# Patient Record
Sex: Male | Born: 1960
Health system: Southern US, Community
[De-identification: ages and names within clinical notes are randomized; demographics above are authoritative.]

## PROBLEM LIST (undated history)

## (undated) DIAGNOSIS — T8859XA Other complications of anesthesia, initial encounter: Secondary | ICD-10-CM

## (undated) DIAGNOSIS — R519 Headache, unspecified: Secondary | ICD-10-CM

## (undated) DIAGNOSIS — I2510 Atherosclerotic heart disease of native coronary artery without angina pectoris: Secondary | ICD-10-CM

## (undated) DIAGNOSIS — Z9289 Personal history of other medical treatment: Secondary | ICD-10-CM

## (undated) DIAGNOSIS — I519 Heart disease, unspecified: Secondary | ICD-10-CM

## (undated) DIAGNOSIS — I10 Essential (primary) hypertension: Secondary | ICD-10-CM

## (undated) DIAGNOSIS — E119 Type 2 diabetes mellitus without complications: Secondary | ICD-10-CM

## (undated) DIAGNOSIS — M1990 Unspecified osteoarthritis, unspecified site: Secondary | ICD-10-CM

## (undated) DIAGNOSIS — T4145XA Adverse effect of unspecified anesthetic, initial encounter: Secondary | ICD-10-CM

## (undated) HISTORY — DX: Atherosclerotic heart disease of native coronary artery without angina pectoris: I25.10

## (undated) HISTORY — DX: Heart disease, unspecified: I51.9

---

## 2001-03-15 ENCOUNTER — Encounter: Payer: Self-pay | Admitting: Neurosurgery

## 2001-03-15 ENCOUNTER — Encounter: Admission: RE | Admit: 2001-03-15 | Discharge: 2001-03-15 | Payer: Self-pay | Admitting: Neurosurgery

## 2004-06-02 HISTORY — PX: BACK SURGERY: SHX140

## 2007-02-09 ENCOUNTER — Encounter: Admission: RE | Admit: 2007-02-09 | Discharge: 2007-02-09 | Payer: Self-pay | Admitting: Internal Medicine

## 2007-02-19 ENCOUNTER — Encounter: Admission: RE | Admit: 2007-02-19 | Discharge: 2007-02-19 | Payer: Self-pay | Admitting: Internal Medicine

## 2011-04-22 ENCOUNTER — Ambulatory Visit: Payer: Self-pay | Admitting: Sports Medicine

## 2011-04-22 ENCOUNTER — Encounter: Payer: Self-pay | Admitting: Sports Medicine

## 2011-04-22 ENCOUNTER — Ambulatory Visit (INDEPENDENT_AMBULATORY_CARE_PROVIDER_SITE_OTHER): Payer: BC Managed Care – PPO | Admitting: Sports Medicine

## 2011-04-22 VITALS — BP 133/87 | HR 69 | Ht 72.5 in | Wt 220.0 lb

## 2011-04-22 DIAGNOSIS — M25569 Pain in unspecified knee: Secondary | ICD-10-CM

## 2011-04-22 DIAGNOSIS — M25562 Pain in left knee: Secondary | ICD-10-CM | POA: Insufficient documentation

## 2011-04-22 NOTE — Progress Notes (Signed)
  Subjective:    Patient ID: Luis Castillo, male    DOB: 1960-07-01, 50 y.o.   MRN: [id number]  HPI Left knee pain: Pain present for approximately 4 months, localized to the posterior medial joint line, noted after running in the beach notes is worse with any type of twisting motion. No snapping, popping, locking, catching. Is able to do the elliptical without a problem. Minimal swelling.  Past medical history: Hypertension Past surgical history: L5-S1 discectomy. Social history: No alcohol, tobacco, or drugs. As a driving experience company, trained as a Clinical research associate. Family history: Non-contributory Allergies: No known drug allergies.  Review of Systems    No fevers, chills, night sweats, weight loss.  Objective:   Physical Exam General:  Well developed, well nourished, and in no acute distress. Neuro:  Alert and oriented x3, extra-ocular muscles intact. Skin: Warm and dry. Respiratory:  Not using accessory muscles, speaking in full sentences. Knee: Normal to inspection with no erythema or effusion or obvious bony abnormalities. Palpation normal with no warmth, patellar tenderness, or condyle tenderness. There is significant tenderness over the posterior medial joint line. ROM full in flexion and extension and lower leg rotation. Ligaments with solid consistent endpoints including ACL, PCL, LCL, MCL. Positive McMurray's, and Apley tests. Non painful patellar compression. Patellar glide without crepitus. Patellar and quadriceps tendons unremarkable. Hamstring and quadriceps strength is normal.   MSK US performed of: Left knee  Knee: All structures visualized. Posterior medial meniscus shows a longitudinal split. Patellar Tendon unremarkable on long and transverse views without effusion. No abnormality of prepatellar bursa. LCL and MCL unremarkable on long and transverse views. No abnormality of origin of medial or lateral head of the gastrocnemius.  Consent obtained and  verified. Time-out conducted. Noted no overlying erythema, induration, or other signs of local infection. Sterile betadine prep. Furthur cleansed with alcohol. Topical analgesic spray: Ethyl chloride. Joint: Knee. Approached in typical fashion with: 25-gauge needle. Completed without difficulty Meds: 1 cc Depo-Medrol 40, 4 cc lidocaine 1% no epi. Advised to call if fevers/chills, erythema, induration, drainage, or persistent bleeding.    Assessment & Plan:   Left knee pain: This most likely represents a degenerative meniscal tear. Injection as above, meniscal rehabilitation exercises, body helix knee sleeve, Aleve as needed for pain, avoid deep knee bending, come back to see [place] in 2-3 weeks.

## 2011-04-22 NOTE — Patient Instructions (Signed)
Great to see you, Injected your knee, Avoid deep bending, Due the rehabilitation exercises, Use Aleve twice a day for pain. Come back to see me in about 3 weeks.  Luis Castillo. Benjamin Stain, M.D. [facility] [address], [place] [address] [phone]

## 2011-05-20 ENCOUNTER — Ambulatory Visit (INDEPENDENT_AMBULATORY_CARE_PROVIDER_SITE_OTHER): Payer: BC Managed Care – PPO | Admitting: Sports Medicine

## 2011-05-20 VITALS — BP 130/82

## 2011-05-20 DIAGNOSIS — M25562 Pain in left knee: Secondary | ICD-10-CM

## 2011-05-20 DIAGNOSIS — M25569 Pain in unspecified knee: Secondary | ICD-10-CM

## 2011-05-20 NOTE — Assessment & Plan Note (Addendum)
Most likely degenerative meniscal tear. Injected again per pt's request. Cleared to go skiing. Cont to wear knee sleeve. Pt will call back ~ 3 weeks, if no better MRI knee.

## 2011-05-20 NOTE — Progress Notes (Signed)
  Subjective:    Patient ID: Luis Castillo, male    DOB: 04-Jan-1961, 50 y.o.   MRN: [id number]  HPI This young man comes back in for followup of his left knee pain. He has a presumptive diagnosis of a meniscal tear. I injected his knee at the last visit, he comes back in with zero mechanical symptoms, and approximately 50% better. He has not really been using any anti-inflammatories. He does note that the rehabilitation exercises continue to help. He does have a ski trip coming up and is requesting another knee injection.  Review of Systems    no fevers, chills, night sweats, weight loss. Objective:   Physical Exam General:  Well developed, well nourished, and in no acute distress. Neuro:  Alert and oriented x3, extra-ocular muscles intact. Skin: Warm and dry. Respiratory:  Not using accessory muscles, speaking in full sentences. Knee: Normal to inspection with no erythema or effusion or obvious bony abnormalities. Palpation normal with no warmth, patellar tenderness, or condyle tenderness. There is significant tenderness over the posterior medial joint line. ROM full in flexion and extension and lower leg rotation. Ligaments with solid consistent endpoints including ACL, PCL, LCL, MCL. Positive McMurray's, and Apley tests. Non painful patellar compression. Patellar glide without crepitus. Patellar and quadriceps tendons unremarkable. Hamstring and quadriceps strength is normal.   Consent obtained and verified. Time-out conducted. Noted no overlying erythema, induration, or other signs of local infection. Sterile betadine prep. Furthur cleansed with alcohol. Topical analgesic spray: Ethyl chloride. Joint: Knee. Approached in typical fashion with: 25-gauge needle. Completed without difficulty Meds: 1 cc Depo-Medrol 40, 4 cc lidocaine 1% no epi. Advised to call if fevers/chills, erythema, induration, drainage, or persistent bleeding.     Assessment & Plan:

## 2011-05-20 NOTE — Patient Instructions (Signed)
Great to see you, Injected knee. Call me in 3 weeks or so re: symptoms. If no better will will MRI knee.  Have fun skiing!   Ihor Austin. Benjamin Stain, M.D. [facility] [address] [place] [address] [phone]

## 2011-06-17 ENCOUNTER — Other Ambulatory Visit: Payer: Self-pay | Admitting: *Deleted

## 2011-06-17 ENCOUNTER — Encounter: Payer: Self-pay | Admitting: *Deleted

## 2011-06-17 DIAGNOSIS — M25562 Pain in left knee: Secondary | ICD-10-CM

## 2011-06-17 NOTE — Patient Instructions (Signed)
MRI APPT IS MON 06/30/11 @ 8:45AM [facility] [address] [phone]

## 2011-06-30 ENCOUNTER — Ambulatory Visit
Admission: RE | Admit: 2011-06-30 | Discharge: 2011-06-30 | Disposition: A | Discharge status: Home / Self Care | Payer: BC Managed Care – PPO | Source: Ambulatory Visit | Attending: Sports Medicine | Admitting: Sports Medicine

## 2011-06-30 DIAGNOSIS — M25562 Pain in left knee: Secondary | ICD-10-CM

## 2011-07-04 ENCOUNTER — Other Ambulatory Visit: Payer: Self-pay | Admitting: Sports Medicine

## 2011-07-04 DIAGNOSIS — M25562 Pain in left knee: Secondary | ICD-10-CM

## 2011-07-07 ENCOUNTER — Encounter: Payer: Self-pay | Admitting: *Deleted

## 2011-07-07 NOTE — Patient Instructions (Signed)
ORTHO APPT IS WITH DR SUPPLE Friday FEB 8TH AT 2:45PM [address], [place], [place] [address] [phone] PT'S WIFE INFORMED.

## 2012-11-29 DIAGNOSIS — H4420 Degenerative myopia, unspecified eye: Secondary | ICD-10-CM | POA: Insufficient documentation

## 2013-03-07 ENCOUNTER — Ambulatory Visit
Admission: RE | Admit: 2013-03-07 | Discharge: 2013-03-07 | Disposition: A | Discharge status: Home / Self Care | Payer: BC Managed Care – PPO | Source: Ambulatory Visit | Attending: Sports Medicine | Admitting: Sports Medicine

## 2013-03-07 ENCOUNTER — Ambulatory Visit (INDEPENDENT_AMBULATORY_CARE_PROVIDER_SITE_OTHER): Payer: BC Managed Care – PPO | Admitting: Sports Medicine

## 2013-03-07 ENCOUNTER — Encounter: Payer: Self-pay | Admitting: Sports Medicine

## 2013-03-07 VITALS — BP 129/89 | Ht 72.5 in | Wt 217.0 lb

## 2013-03-07 DIAGNOSIS — M25561 Pain in right knee: Secondary | ICD-10-CM

## 2013-03-07 DIAGNOSIS — M25569 Pain in unspecified knee: Secondary | ICD-10-CM

## 2013-03-07 NOTE — Progress Notes (Signed)
Luis Castillo is a 52 y.o. male who presents today for R knee pain.  Pt recently had L meniscectomy in February 2014 by Dr. Rennis Chris, and shortly thereafter around March, developed R medial knee joint pain, acute in onset after running.  Denies any previous problems with his right knee..  Pt initially had an achy soreness after running in his right knee that would dissipate over the following day. However, pt has noticed the pain has intensified over the last 3-4 months, now having soreness for several days after, along with pain with squatting or twisting.  He denies any locking, catching, instability, joint effusion, erythema, or any other joints affected.  Does state this pain feels very similar to the left side.     Current Outpatient Prescriptions on File Prior to Visit  Medication Sig Dispense Refill  . amLODipine-benazepril (LOTREL) 5-20 MG per capsule Take 5-20 mg by mouth daily.      . hydrochlorothiazide (HYDRODIURIL) 25 MG tablet Take 25 mg by mouth daily.      Marland Kitchen VIAGRA 100 MG tablet Take 100 mg by mouth as needed.       No current facility-administered medications on file prior to visit.    ROS: Per HPI.  All other systems reviewed and are negative.   Physical Exam Filed Vitals:   03/07/13 1000  BP: 129/89    Physical Examination:  52 y/o M in NAD R Knee: Trace effusion Palpation with posterior medial joint line tenderness.  No warmth or patellar tenderness or condyle tenderness. ROM with full extension, 110-120 degrees flexion  Ligaments with solid consistent endpoints including ACL, PCL, LCL, MCL. Positive Apley's and Thessaly test.  Positive medial McMurrays  Non painful patellar compression. Patellar and quadriceps tendons unremarkable. Hamstring and quadriceps strength is normal.    MSK [place] R knee, Long and Short Axis obtained - posterior medial calcaneous with hyperechoic area in the meniscus present representing possible calcification.  Slight protrusion with  hypoechoic area possible representing edema.

## 2013-03-07 NOTE — Patient Instructions (Addendum)
You have been scheduled for a MRI of your right knee on 03/23/13 at 9 am at [facility] [address] [place]  [phone]

## 2013-03-07 NOTE — Assessment & Plan Note (Addendum)
Will obtain X-rays to evaluate for possible bony or acute changes in the knee.  As well, will get MRI to evaluate for possible medial meniscal tear.  Pt saw Dr. Rennis Chris, [facility], in the past for a L menisectomy, and if MRI does show this, will refer back for further evaluation and management.

## 2013-03-08 ENCOUNTER — Telehealth: Payer: Self-pay | Admitting: *Deleted

## 2013-03-08 NOTE — Telephone Encounter (Signed)
Message copied by Jacki Cones C on Tue Mar 08, 2013 11:00 AM ------      Message from: Ralene Cork      Created: Tue Mar 08, 2013 10:12 AM      Regarding: xrays       Please call and tell him that his x-rays look good. I will call him after reviewing his MRI.            ----- Message -----         From: Rad Results In Interface         Sent: 03/07/2013   1:23 PM           To: Ralene Cork, DO                   ------

## 2013-03-08 NOTE — Telephone Encounter (Signed)
Left pt a VM to call back

## 2013-03-09 NOTE — Telephone Encounter (Signed)
Spoke with pt- gave him x-ray results.

## 2013-03-21 ENCOUNTER — Ambulatory Visit
Admission: RE | Admit: 2013-03-21 | Discharge: 2013-03-21 | Disposition: A | Discharge status: Home / Self Care | Payer: BC Managed Care – PPO | Source: Ambulatory Visit | Attending: Sports Medicine | Admitting: Sports Medicine

## 2013-03-21 DIAGNOSIS — M25561 Pain in right knee: Secondary | ICD-10-CM

## 2013-03-22 ENCOUNTER — Telehealth: Payer: Self-pay | Admitting: Sports Medicine

## 2013-03-22 NOTE — Telephone Encounter (Signed)
I spoke with the patient on the phone regarding MRI findings of his right knee. He has a radial tear through the body of the medial meniscus. The rest of his knee looks really good. No significant degenerative changes. His left knee was previously scoped by Dr. Rennis Chris with good results. He would like to return to Dr. supple for arthroscopy on his right knee but his travel plans right now will not allow him to pursue surgery. Therefore, I've asked the patient to contact [facility] orthopedics directly to see if he needs a referral. So, he will call me back at his convenience at which point I will be happy to make the referral for him. Followup with me prn.

## 2013-03-23 ENCOUNTER — Other Ambulatory Visit: Payer: BC Managed Care – PPO

## 2013-06-02 HISTORY — PX: KNEE CARTILAGE SURGERY: SHX688

## 2014-03-13 IMAGING — CR DG KNEE AP/LAT W/ SUNRISE*R*
3 series · 3 of 3 positions shown · non-contrast
Comparison: None

CLINICAL DATA: Pain in medial knee and tibial tubercle especially
after running or impact exercise

EXAM:
DG KNEE - 3 VIEWS

[view not recorded (1 of 3)]
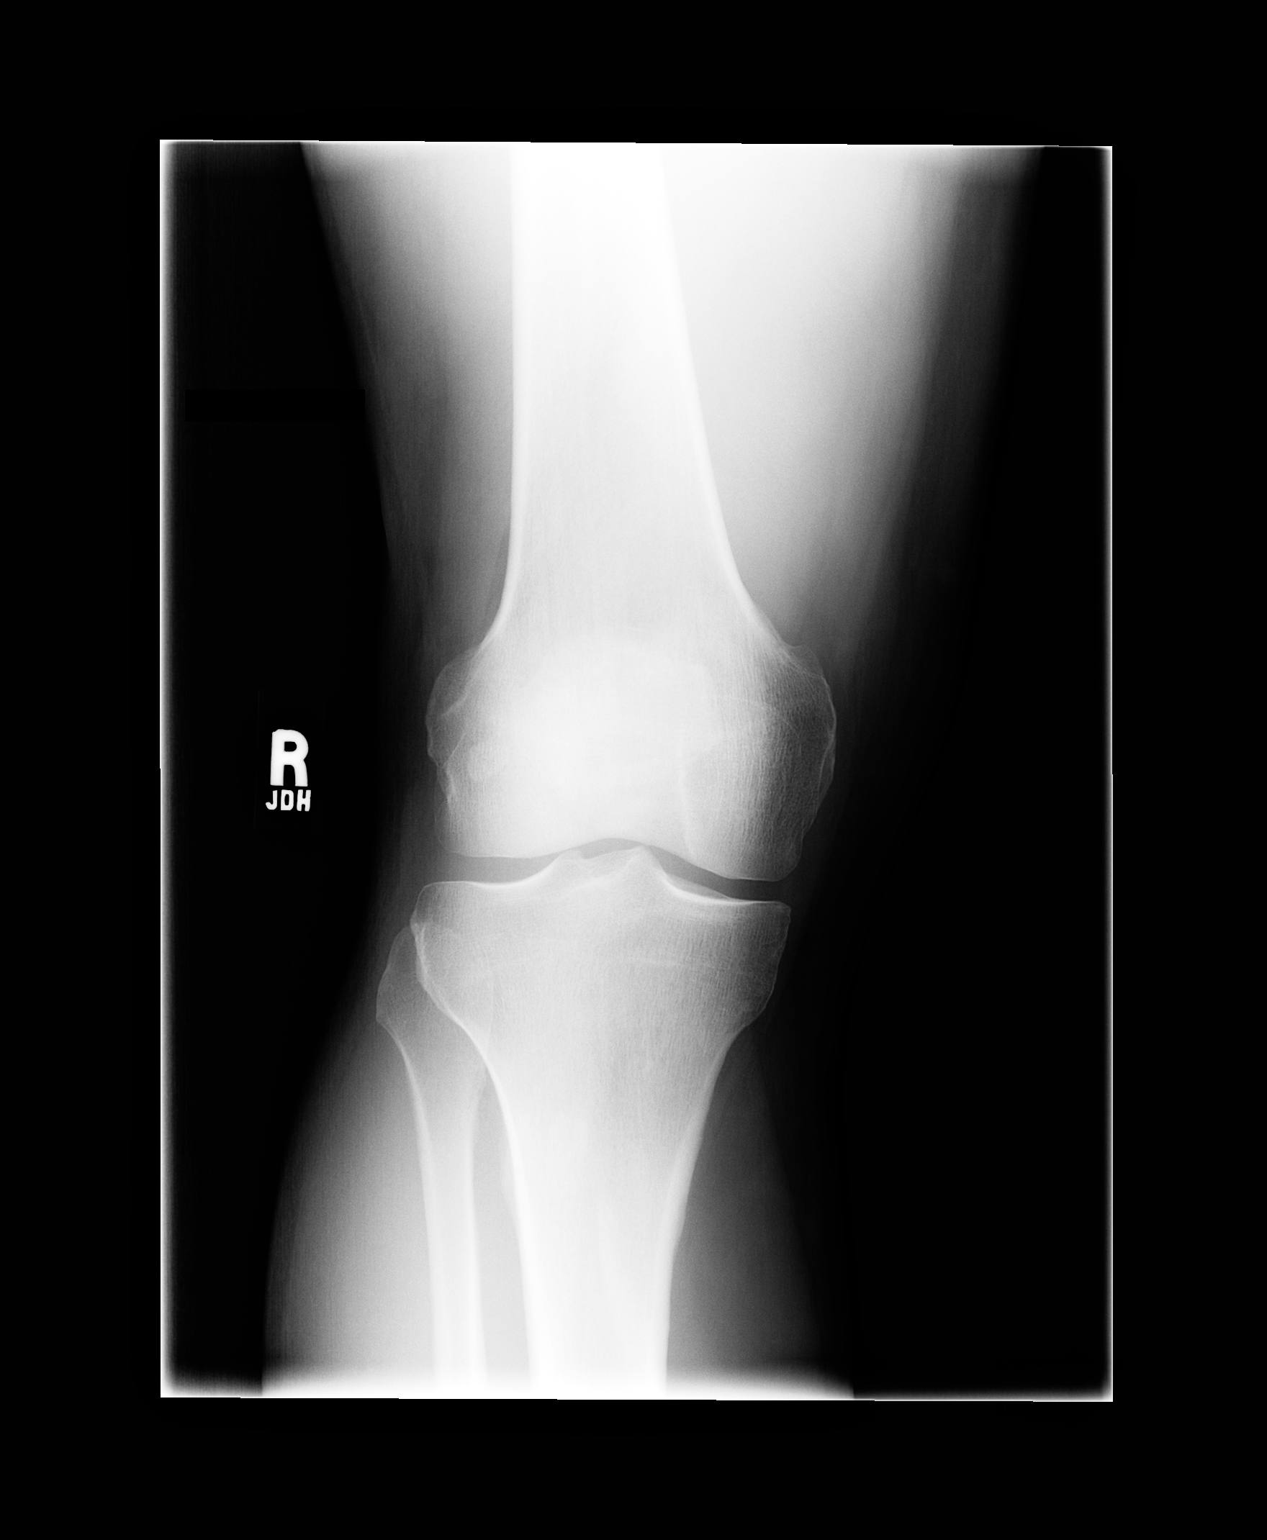

[view not recorded (2 of 3)]
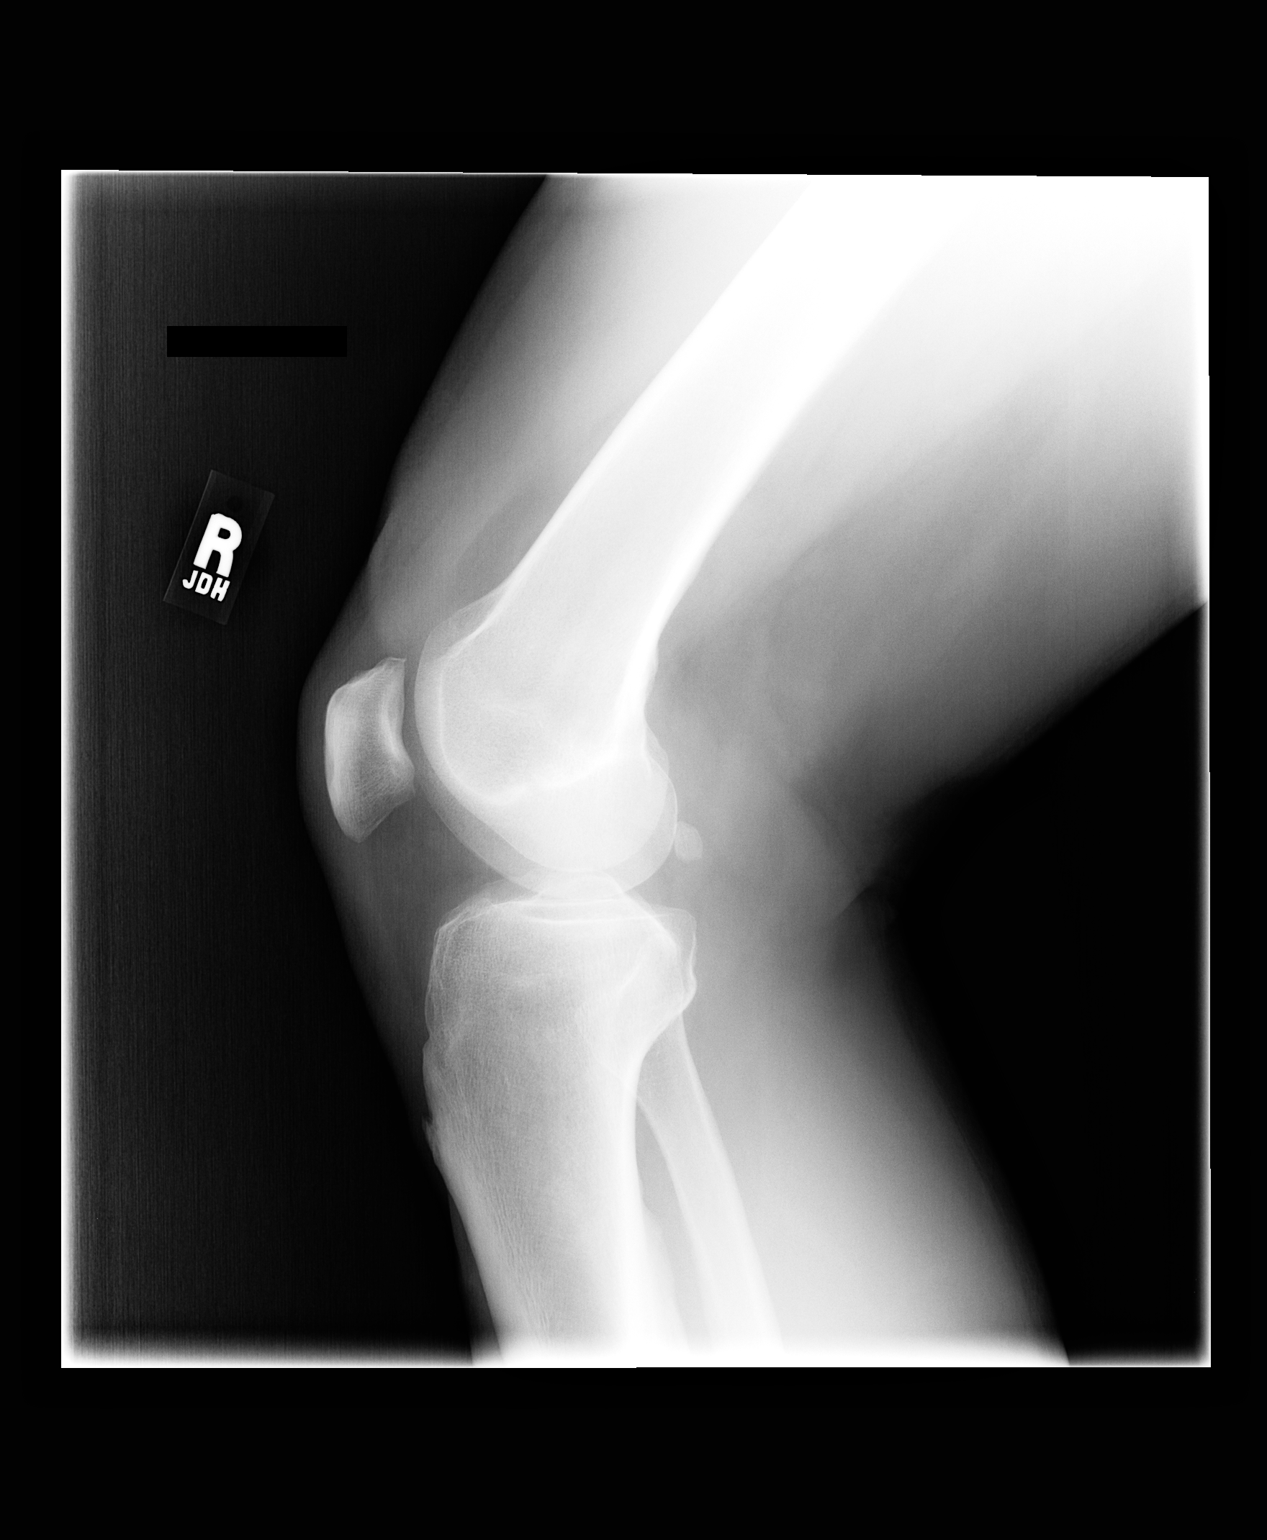

[view not recorded (3 of 3)]
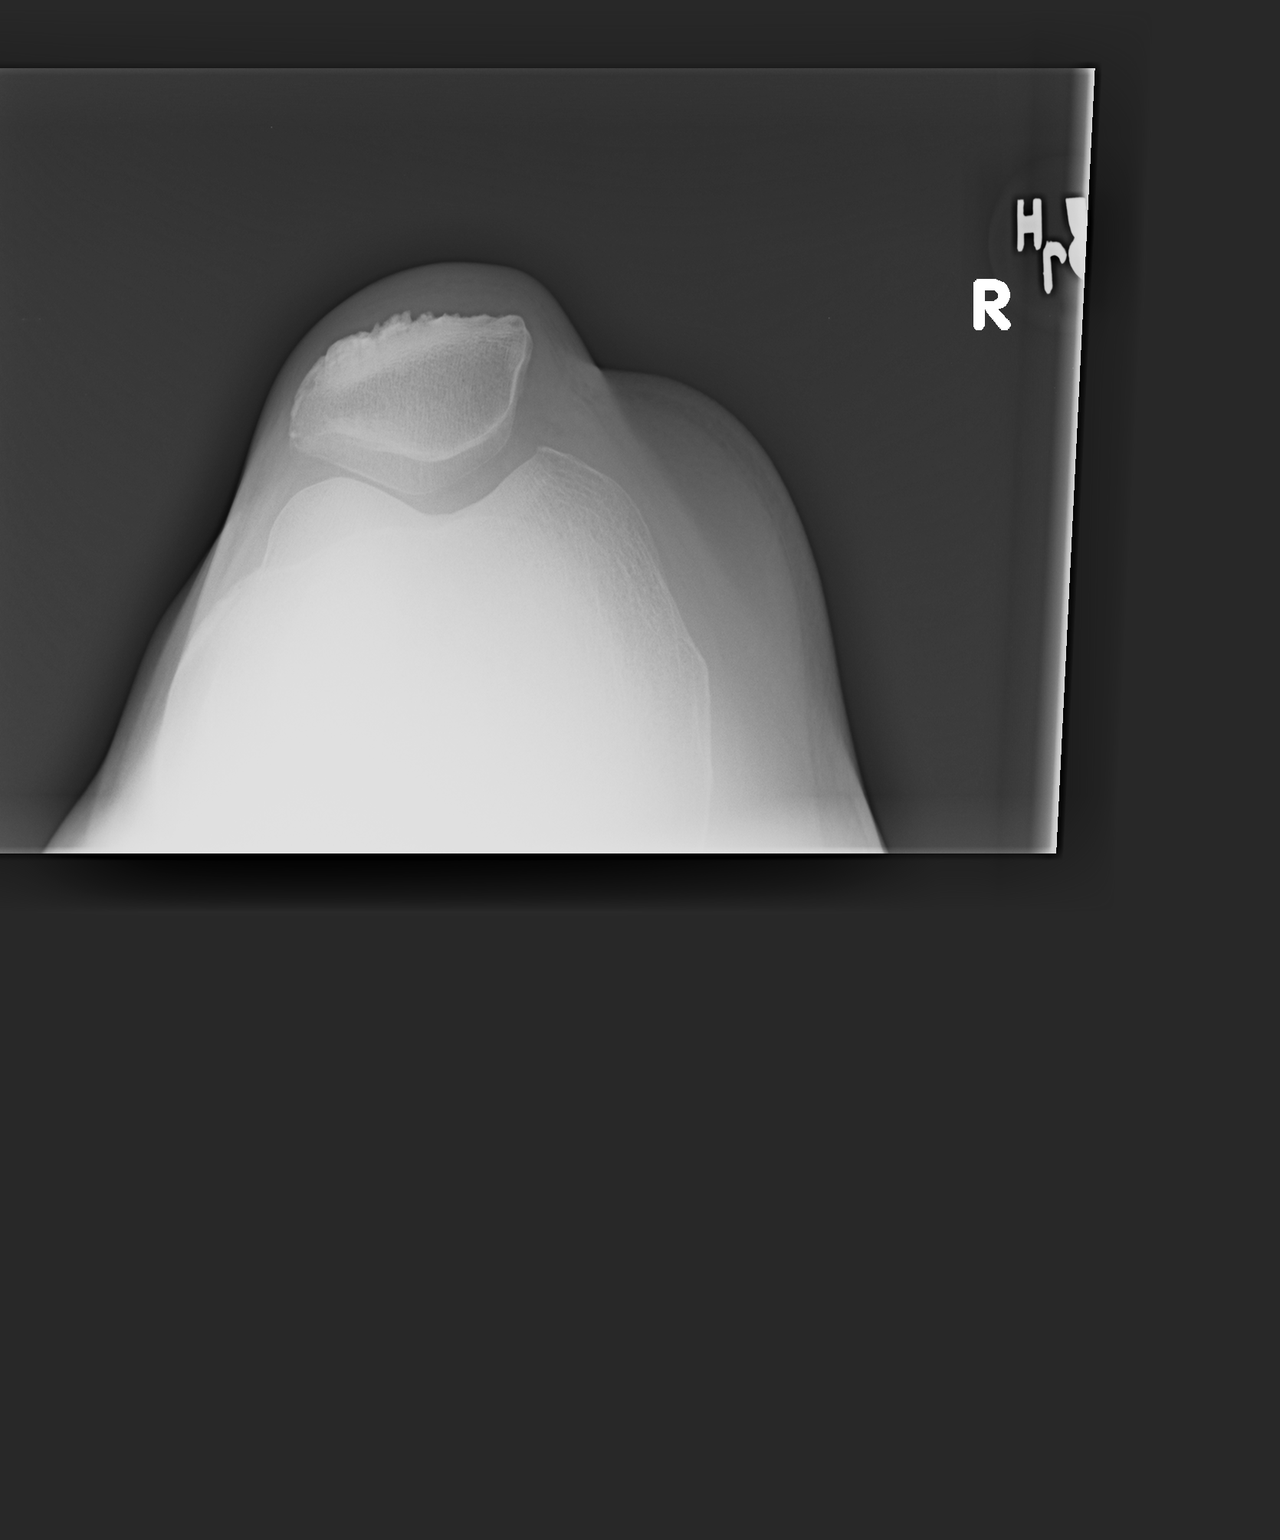

[3 of 3 positions shown; findings below may reference images not displayed]

FINDINGS: Osseous mineralization normal.

Joint spaces preserved.

No acute fracture, dislocation or bone destruction.

No knee joint effusion.
IMPRESSION: No acute osseous abnormalities.

## 2016-09-18 ENCOUNTER — Other Ambulatory Visit: Payer: Self-pay | Admitting: Gastroenterology

## 2016-11-09 NOTE — Anesthesia Preprocedure Evaluation (Addendum)
Anesthesia Evaluation  Patient identified by MRN, date of birth, ID band Patient awake    Reviewed: Allergy & Precautions, NPO status , Patient's Chart, lab work & pertinent test results  History of Anesthesia Complications Negative for: history of anesthetic complications  Airway Mallampati: II  TM Distance: >3 FB Neck ROM: Full    Dental no notable dental hx. (+) Dental Advisory Given   Pulmonary neg pulmonary ROS,    Pulmonary exam normal        Cardiovascular hypertension, Pt. on medications Normal cardiovascular exam     Neuro/Psych negative neurological ROS  negative psych ROS   GI/Hepatic negative GI ROS, Neg liver ROS,   Endo/Other  diabetes  Renal/GU negative Renal ROS     Musculoskeletal negative musculoskeletal ROS (+)   Abdominal   Peds  Hematology negative hematology ROS (+)   Anesthesia Other Findings Day of surgery medications reviewed with the patient.  Reproductive/Obstetrics                            Anesthesia Physical Anesthesia Plan  ASA: II  Anesthesia Plan: MAC   Post-op Pain Management:    Induction: Intravenous  PONV Risk Score and Plan: 1 and Ondansetron and Propofol  Airway Management Planned: Simple Face Mask and Natural Airway  Additional Equipment:   Intra-op Plan:   Post-operative Plan:   Informed Consent: I have reviewed the patients History and Physical, chart, labs and discussed the procedure including the risks, benefits and alternatives for the proposed anesthesia with the patient or authorized representative who has indicated his/her understanding and acceptance.   Dental advisory given  Plan Discussed with: CRNA and Anesthesiologist  Anesthesia Plan Comments:        Anesthesia Quick Evaluation

## 2016-11-10 ENCOUNTER — Encounter (HOSPITAL_COMMUNITY)
Admission: RE | Disposition: A | Discharge status: Home / Self Care | Payer: Self-pay | Source: Ambulatory Visit | Attending: Gastroenterology

## 2016-11-10 ENCOUNTER — Ambulatory Visit (HOSPITAL_COMMUNITY)
Admission: RE | Admit: 2016-11-10 | Discharge: 2016-11-10 | Disposition: A | Discharge status: Home / Self Care | Payer: BLUE CROSS/BLUE SHIELD | Source: Ambulatory Visit | Attending: Gastroenterology | Admitting: Gastroenterology

## 2016-11-10 ENCOUNTER — Encounter (HOSPITAL_COMMUNITY): Payer: Self-pay

## 2016-11-10 ENCOUNTER — Ambulatory Visit (HOSPITAL_COMMUNITY): Payer: BLUE CROSS/BLUE SHIELD | Admitting: Anesthesiology

## 2016-11-10 DIAGNOSIS — Z1211 Encounter for screening for malignant neoplasm of colon: Secondary | ICD-10-CM | POA: Insufficient documentation

## 2016-11-10 DIAGNOSIS — Z86010 Personal history of colonic polyps: Secondary | ICD-10-CM | POA: Insufficient documentation

## 2016-11-10 DIAGNOSIS — E7800 Pure hypercholesterolemia, unspecified: Secondary | ICD-10-CM | POA: Insufficient documentation

## 2016-11-10 DIAGNOSIS — Z7984 Long term (current) use of oral hypoglycemic drugs: Secondary | ICD-10-CM | POA: Insufficient documentation

## 2016-11-10 DIAGNOSIS — Z7982 Long term (current) use of aspirin: Secondary | ICD-10-CM | POA: Diagnosis not present

## 2016-11-10 DIAGNOSIS — E119 Type 2 diabetes mellitus without complications: Secondary | ICD-10-CM | POA: Diagnosis not present

## 2016-11-10 DIAGNOSIS — I10 Essential (primary) hypertension: Secondary | ICD-10-CM | POA: Insufficient documentation

## 2016-11-10 DIAGNOSIS — Z79899 Other long term (current) drug therapy: Secondary | ICD-10-CM | POA: Insufficient documentation

## 2016-11-10 HISTORY — DX: Adverse effect of unspecified anesthetic, initial encounter: T41.45XA

## 2016-11-10 HISTORY — DX: Essential (primary) hypertension: I10

## 2016-11-10 HISTORY — DX: Other complications of anesthesia, initial encounter: T88.59XA

## 2016-11-10 HISTORY — PX: COLONOSCOPY WITH PROPOFOL: SHX5780

## 2016-11-10 HISTORY — DX: Type 2 diabetes mellitus without complications: E11.9

## 2016-11-10 LAB — GLUCOSE, CAPILLARY: Glucose-Capillary: 175 mg/dL — ABNORMAL HIGH (ref 65–99)

## 2016-11-10 SURGERY — COLONOSCOPY WITH PROPOFOL
Anesthesia: Monitor Anesthesia Care

## 2016-11-10 MED ORDER — GLYCOPYRROLATE 0.2 MG/ML IV SOSY
PREFILLED_SYRINGE | INTRAVENOUS | Status: AC
  Filled 2016-11-10: qty 5

## 2016-11-10 MED ORDER — PROPOFOL 10 MG/ML IV BOLUS
INTRAVENOUS | Status: AC
  Filled 2016-11-10: qty 40

## 2016-11-10 MED ORDER — SODIUM CHLORIDE 0.9 % IV SOLN: INTRAVENOUS | Status: DC

## 2016-11-10 MED ORDER — MIDAZOLAM HCL 2 MG/2ML IJ SOLN
INTRAMUSCULAR | Status: AC
  Filled 2016-11-10: qty 2

## 2016-11-10 MED ORDER — PROPOFOL 500 MG/50ML IV EMUL
INTRAVENOUS | Status: DC | PRN
  Administered 2016-11-10: 135 ug/kg/min via INTRAVENOUS

## 2016-11-10 MED ORDER — LACTATED RINGERS IV SOLN
INTRAVENOUS | Status: DC | PRN
  Administered 2016-11-10: 07:00:00 via INTRAVENOUS

## 2016-11-10 MED ORDER — LACTATED RINGERS IV SOLN: INTRAVENOUS | Status: DC

## 2016-11-10 MED ORDER — PROPOFOL 10 MG/ML IV BOLUS
INTRAVENOUS | Status: AC
  Filled 2016-11-10: qty 20

## 2016-11-10 MED ORDER — PROPOFOL 500 MG/50ML IV EMUL
INTRAVENOUS | Status: DC | PRN
  Administered 2016-11-10: 50 mg via INTRAVENOUS

## 2016-11-10 MED ORDER — GLYCOPYRROLATE 0.2 MG/ML IJ SOLN
INTRAMUSCULAR | Status: DC | PRN
  Administered 2016-11-10: 0.2 mg via INTRAVENOUS

## 2016-11-10 MED ORDER — MIDAZOLAM HCL 5 MG/5ML IJ SOLN
INTRAMUSCULAR | Status: DC | PRN
  Administered 2016-11-10: 2 mg via INTRAVENOUS

## 2016-11-10 SURGICAL SUPPLY — 21 items

## 2016-11-10 NOTE — Discharge Instructions (Signed)

## 2016-11-10 NOTE — Anesthesia Postprocedure Evaluation (Signed)
Anesthesia Post Note  Patient: Luis Castillo  Procedure(s) Performed: Procedure(s) (LRB): COLONOSCOPY WITH PROPOFOL (N/A)     Patient location during evaluation: Endoscopy Anesthesia Type: MAC Level of consciousness: awake and alert Pain management: pain level controlled Vital Signs Assessment: post-procedure vital signs reviewed and stable Respiratory status: spontaneous breathing and respiratory function stable Cardiovascular status: stable Anesthetic complications: no    Last Vitals:  Vitals:   11/10/16 0800 11/10/16 0810  BP: 133/79 136/90  Pulse: 66 62  Resp: 16 18  Temp:      Last Pain:  Vitals:   11/10/16 0624  TempSrc: Oral                 Kayte Borchard DANIEL

## 2016-11-10 NOTE — Op Note (Signed)
[facility] Patient Name: Luis Castillo Procedure Date: 11/10/2016 MRN: [id number] Attending MD: Charolett Bumpers , MD Date of Birth: 03-28-1961 CSN: [id number] Age: 56 Admit Type: Outpatient Procedure:                Colonoscopy Indications:              High risk colon cancer surveillance: 07/01/2011                            screening colonoscopy was performed with removal of                            a 3 mm tubular adenomatous descending colon polyp Providers:                Charolett Bumpers, MD, Omelia Blackwater RN, RN,                            Zoila Shutter, Technician Referring MD:              Medicines:                Propofol per Anesthesia Complications:            No immediate complications. Estimated Blood Loss:     Estimated blood loss: none. Procedure:                Pre-Anesthesia Assessment:                           - Prior to the procedure, a History and Physical                            was performed, and patient medications and                            allergies were reviewed. The patient's tolerance of                            previous anesthesia was also reviewed. The risks                            and benefits of the procedure and the sedation                            options and risks were discussed with the patient.                            All questions were answered, and informed consent                            was obtained. Prior Anticoagulants: The patient has                            taken aspirin, last dose was 1 day prior to  procedure. ASA Grade Assessment: II - A patient                            with mild systemic disease. After reviewing the                            risks and benefits, the patient was deemed in                            satisfactory condition to undergo the procedure.                           After obtaining informed consent, the colonoscope      was passed under direct vision. Throughout the                            procedure, the patient's blood pressure, pulse, and                            oxygen saturations were monitored continuously. The                            EC-[id number] ([id number]) scope was introduced through                            the anus and advanced to the the cecum, identified                            by appendiceal orifice and ileocecal valve. The                            colonoscopy was performed without difficulty. The                            patient tolerated the procedure well. The quality                            of the bowel preparation was good. The terminal                            ileum, the ileocecal valve, the appendiceal orifice                            and the rectum were photographed. Scope In: 7:31:20 AM Scope Out: 7:47:09 AM Scope Withdrawal Time: 0 hours 9 minutes 7 seconds  Total Procedure Duration: 0 hours 15 minutes 49 seconds  Findings:      The perianal and digital rectal examinations were normal.      The entire examined colon appeared normal. Impression:               - The entire examined colon is normal.                           - No specimens collected. Moderate Sedation:  N/A- Per Anesthesia Care Recommendation:           - Patient has a contact number available for                            emergencies. The signs and symptoms of potential                            delayed complications were discussed with the                            patient. Return to normal activities tomorrow.                            Written discharge instructions were provided to the                            patient.                           - Repeat colonoscopy in 10 years for screening                            purposes.                           - Resume previous diet.                           - Continue present medications. Procedure Code(s):        --- Professional  ---                           Z6109, Colorectal cancer screening; colonoscopy on                            individual at high risk Diagnosis Code(s):        --- Professional ---                           Z86.010, Personal history of colonic polyps CPT copyright 2016 American Medical Association. All rights reserved. The codes documented in this report are preliminary and upon coder review may  be revised to meet current compliance requirements. Danise Edge, MD Charolett Bumpers, MD 11/10/2016 7:55:43 AM This report has been signed electronically. Number of Addenda: 0

## 2016-11-10 NOTE — H&P (Signed)
Procedure: Surveillance colonoscopy. 07/01/2011 screening colonoscopy was performed with removal of a 3 mm tubular adenomatous descending colon polyp  History: The patient is a 56 year old male born Sep 21, 1960. He is scheduled to undergo a surveillance colonoscopy today.  Past medical history: Bilateral knee meniscus repairs. Hemorrhoid surgery. Microdiscectomy of the lumbar spine. Hypertension. Type 2 diabetes mellitus. Hypercholesterolemia. Hepatic steatosis. Glaucoma.  Exam: The patient is alert and lying comfortably on the endoscopy stretcher. Abdomen is soft and nontender to palpation. Lungs are clear to auscultation. Cardiac exam reveals a regular rhythm.  Plan: Proceed with surveillance colonoscopy

## 2016-11-10 NOTE — Transfer of Care (Signed)
Immediate Anesthesia Transfer of Care Note  Patient: Luis Castillo  Procedure(s) Performed: Procedure(s): COLONOSCOPY WITH PROPOFOL (N/A)  Patient Location: PACU  Anesthesia Type:MAC  Level of Consciousness:  sedated, patient cooperative and responds to stimulation  Airway & Oxygen Therapy:Patient Spontanous Breathing and Patient connected to face mask oxgen  Post-op Assessment:  Report given to PACU RN and Post -op Vital signs reviewed and stable  Post vital signs:  Reviewed and stable  Last Vitals:  Vitals:   11/10/16 0624  BP: (!) 174/89  Pulse: (!) 50  Resp: 13  Temp: 06.2 C    Complications: No apparent anesthesia complications

## 2016-11-11 ENCOUNTER — Encounter (HOSPITAL_COMMUNITY): Payer: Self-pay | Admitting: Gastroenterology

## 2017-09-14 DIAGNOSIS — E1169 Type 2 diabetes mellitus with other specified complication: Secondary | ICD-10-CM | POA: Diagnosis not present

## 2017-09-14 DIAGNOSIS — E7800 Pure hypercholesterolemia, unspecified: Secondary | ICD-10-CM | POA: Diagnosis not present

## 2017-09-14 DIAGNOSIS — Z125 Encounter for screening for malignant neoplasm of prostate: Secondary | ICD-10-CM | POA: Diagnosis not present

## 2017-09-14 DIAGNOSIS — Z1389 Encounter for screening for other disorder: Secondary | ICD-10-CM | POA: Diagnosis not present

## 2017-09-14 DIAGNOSIS — Z0000 Encounter for general adult medical examination without abnormal findings: Secondary | ICD-10-CM | POA: Diagnosis not present

## 2017-10-05 DIAGNOSIS — E119 Type 2 diabetes mellitus without complications: Secondary | ICD-10-CM | POA: Diagnosis not present

## 2017-12-23 DIAGNOSIS — H40013 Open angle with borderline findings, low risk, bilateral: Secondary | ICD-10-CM | POA: Diagnosis not present

## 2017-12-23 DIAGNOSIS — Z7984 Long term (current) use of oral hypoglycemic drugs: Secondary | ICD-10-CM | POA: Diagnosis not present

## 2017-12-23 DIAGNOSIS — E119 Type 2 diabetes mellitus without complications: Secondary | ICD-10-CM | POA: Diagnosis not present

## 2017-12-28 DIAGNOSIS — E119 Type 2 diabetes mellitus without complications: Secondary | ICD-10-CM | POA: Diagnosis not present

## 2018-01-18 ENCOUNTER — Ambulatory Visit: Payer: BLUE CROSS/BLUE SHIELD | Admitting: Sports Medicine

## 2018-01-18 VITALS — BP 134/92 | Ht 72.5 in | Wt 213.0 lb

## 2018-01-18 DIAGNOSIS — M7712 Lateral epicondylitis, left elbow: Secondary | ICD-10-CM | POA: Diagnosis not present

## 2018-01-18 MED ORDER — MELOXICAM 15 MG PO TABS: ORAL_TABLET | ORAL | 1 refills | Status: DC

## 2018-01-19 ENCOUNTER — Encounter: Payer: Self-pay | Admitting: Sports Medicine

## 2018-01-19 NOTE — Progress Notes (Signed)
   Subjective:    Patient ID: Luis Castillo, male    DOB: 04-23-1961, 57 y.o.   MRN: [id number]  HPI chief complaint: Left elbow pain and mid back pain  Very pleasant 57 year old male comes in today complaining of 3 months of lateral left elbow pain. He does not recall any specific injury but began to notice some increasing discomfort when weight lifting. He notes that certain lifts like bicep curls will cause him significant discomfort. Other exercises are less painful. His pain is intermittent. He does get some pain with activities of daily living such as picking up heavy objects with his left hand. He has noticed some swelling along the lateral elbow. He denies any problems with the left elbow in the past but does have a history of lateral epicondylitis on the right. His current pain in the left elbowt feels a little bit different to him. He denies radiating pain into the forearm. He denies numbness or tingling. No specific treatment to date. He is right-hand dominant. He is also complaining of 2 years of intermittent right-sided mid back pain. Pain is very specific to golf. He notes that when he takes a full swing of a club he has significant discomfort that he localizes to the mid back. It will improve as he plays but it can be quite uncomfortable at the beginning of the round. He denies pain with any other activity. No nighttime pain. No fevers or chills. No history of cancer. Pain does not radiate. No treatment to date.  Interim medical history reviewed Medications reviewed Allergies reviewed    Review of Systems As above    Objective:   Physical Exam  Well-developed, well-nourished. No acute distress. Awake alert and oriented 3. Vital signs reviewed.  Left elbow: Full range of motion. No effusion. There is some slight soft tissue swelling over the lateral elbow and tenderness to palpation over the lateral epicondyle. He has reproducible pain with resisted wrist extension and  resisted middle finger extension. No tenderness to palpation over the medial epicondyle. No tenderness over the radial tunnel. Good grip strength. Neurovascular intact distally.  Thoracic spine: Full painless thoracic range of motion. No spasm. No tenderness to palpation along the midline. There is some slight reproducible pain just to the right of the T10-T11 vertebrae.  MSK ultrasound of the left elbow was performed. Limited images were obtained.There is definite thickening of the common extensor tendon with interstitial hypoechoic changes consistent with lateral epicondylitis.There is also some spurring off of the lateral epicondyle. Findings are consistent with lateral epicondylitis.      Assessment & Plan:    left elbow pain secondary to lateral epicondylitis Mechanical mid back pain  Meloxicam 15 mg daily with food for 10 days then as needed. Patient is educated in a home exercise program. Counterforce brace with activity. I've given him Luis Castillo's information in case he would like formal physical therapy either for his back or his elbow. If symptoms persist in regards to his lateral epicondylitis we could consider merits of a single cortisone injection. Patient needs to avoid those activities which cause discomfort, likely for the next 3 months or so. He will follow-up for ongoing or recalcitrant issues.

## 2018-06-09 DIAGNOSIS — E7800 Pure hypercholesterolemia, unspecified: Secondary | ICD-10-CM | POA: Diagnosis not present

## 2018-06-09 DIAGNOSIS — I10 Essential (primary) hypertension: Secondary | ICD-10-CM | POA: Diagnosis not present

## 2018-06-09 DIAGNOSIS — E1169 Type 2 diabetes mellitus with other specified complication: Secondary | ICD-10-CM | POA: Diagnosis not present

## 2018-06-09 DIAGNOSIS — E1165 Type 2 diabetes mellitus with hyperglycemia: Secondary | ICD-10-CM | POA: Diagnosis not present

## 2019-03-23 DIAGNOSIS — D485 Neoplasm of uncertain behavior of skin: Secondary | ICD-10-CM | POA: Diagnosis not present

## 2019-03-23 DIAGNOSIS — E119 Type 2 diabetes mellitus without complications: Secondary | ICD-10-CM | POA: Diagnosis not present

## 2019-03-23 DIAGNOSIS — L570 Actinic keratosis: Secondary | ICD-10-CM | POA: Diagnosis not present

## 2019-03-23 DIAGNOSIS — L439 Lichen planus, unspecified: Secondary | ICD-10-CM | POA: Diagnosis not present

## 2019-03-23 DIAGNOSIS — D1801 Hemangioma of skin and subcutaneous tissue: Secondary | ICD-10-CM | POA: Diagnosis not present

## 2019-03-23 DIAGNOSIS — D225 Melanocytic nevi of trunk: Secondary | ICD-10-CM | POA: Diagnosis not present

## 2019-03-23 DIAGNOSIS — L821 Other seborrheic keratosis: Secondary | ICD-10-CM | POA: Diagnosis not present

## 2019-04-04 DIAGNOSIS — E7800 Pure hypercholesterolemia, unspecified: Secondary | ICD-10-CM | POA: Diagnosis not present

## 2019-04-04 DIAGNOSIS — Z23 Encounter for immunization: Secondary | ICD-10-CM | POA: Diagnosis not present

## 2019-04-04 DIAGNOSIS — E1169 Type 2 diabetes mellitus with other specified complication: Secondary | ICD-10-CM | POA: Diagnosis not present

## 2019-04-04 DIAGNOSIS — Z1389 Encounter for screening for other disorder: Secondary | ICD-10-CM | POA: Diagnosis not present

## 2019-04-04 DIAGNOSIS — L570 Actinic keratosis: Secondary | ICD-10-CM | POA: Diagnosis not present

## 2019-04-04 DIAGNOSIS — Z125 Encounter for screening for malignant neoplasm of prostate: Secondary | ICD-10-CM | POA: Diagnosis not present

## 2019-04-04 DIAGNOSIS — Z0000 Encounter for general adult medical examination without abnormal findings: Secondary | ICD-10-CM | POA: Diagnosis not present

## 2019-07-28 ENCOUNTER — Ambulatory Visit: Payer: BC Managed Care – PPO | Attending: Internal Medicine

## 2019-07-28 DIAGNOSIS — Z20822 Contact with and (suspected) exposure to covid-19: Secondary | ICD-10-CM | POA: Diagnosis not present

## 2019-07-29 LAB — NOVEL CORONAVIRUS, NAA: SARS-CoV-2, NAA: NOT DETECTED

## 2019-08-11 DIAGNOSIS — Z23 Encounter for immunization: Secondary | ICD-10-CM | POA: Diagnosis not present

## 2019-09-10 DIAGNOSIS — Z23 Encounter for immunization: Secondary | ICD-10-CM | POA: Diagnosis not present

## 2019-10-03 DIAGNOSIS — E1169 Type 2 diabetes mellitus with other specified complication: Secondary | ICD-10-CM | POA: Diagnosis not present

## 2019-10-03 DIAGNOSIS — Z23 Encounter for immunization: Secondary | ICD-10-CM | POA: Diagnosis not present

## 2019-10-03 DIAGNOSIS — I10 Essential (primary) hypertension: Secondary | ICD-10-CM | POA: Diagnosis not present

## 2019-10-03 DIAGNOSIS — E7800 Pure hypercholesterolemia, unspecified: Secondary | ICD-10-CM | POA: Diagnosis not present

## 2020-01-22 DIAGNOSIS — Z03818 Encounter for observation for suspected exposure to other biological agents ruled out: Secondary | ICD-10-CM | POA: Diagnosis not present

## 2020-01-22 DIAGNOSIS — Z20822 Contact with and (suspected) exposure to covid-19: Secondary | ICD-10-CM | POA: Diagnosis not present

## 2020-02-27 DIAGNOSIS — L255 Unspecified contact dermatitis due to plants, except food: Secondary | ICD-10-CM | POA: Diagnosis not present

## 2020-03-21 DIAGNOSIS — Z23 Encounter for immunization: Secondary | ICD-10-CM | POA: Diagnosis not present

## 2020-03-22 DIAGNOSIS — L814 Other melanin hyperpigmentation: Secondary | ICD-10-CM | POA: Diagnosis not present

## 2020-03-22 DIAGNOSIS — L821 Other seborrheic keratosis: Secondary | ICD-10-CM | POA: Diagnosis not present

## 2020-03-22 DIAGNOSIS — D225 Melanocytic nevi of trunk: Secondary | ICD-10-CM | POA: Diagnosis not present

## 2020-03-22 DIAGNOSIS — L570 Actinic keratosis: Secondary | ICD-10-CM | POA: Diagnosis not present

## 2020-03-22 DIAGNOSIS — D485 Neoplasm of uncertain behavior of skin: Secondary | ICD-10-CM | POA: Diagnosis not present

## 2020-03-22 DIAGNOSIS — L439 Lichen planus, unspecified: Secondary | ICD-10-CM | POA: Diagnosis not present

## 2020-03-22 DIAGNOSIS — D1801 Hemangioma of skin and subcutaneous tissue: Secondary | ICD-10-CM | POA: Diagnosis not present

## 2020-05-02 DIAGNOSIS — H40023 Open angle with borderline findings, high risk, bilateral: Secondary | ICD-10-CM | POA: Diagnosis not present

## 2020-05-02 DIAGNOSIS — E119 Type 2 diabetes mellitus without complications: Secondary | ICD-10-CM | POA: Diagnosis not present

## 2020-05-14 DIAGNOSIS — Z0000 Encounter for general adult medical examination without abnormal findings: Secondary | ICD-10-CM | POA: Diagnosis not present

## 2020-05-14 DIAGNOSIS — Z125 Encounter for screening for malignant neoplasm of prostate: Secondary | ICD-10-CM | POA: Diagnosis not present

## 2020-05-14 DIAGNOSIS — E7800 Pure hypercholesterolemia, unspecified: Secondary | ICD-10-CM | POA: Diagnosis not present

## 2020-05-14 DIAGNOSIS — I10 Essential (primary) hypertension: Secondary | ICD-10-CM | POA: Diagnosis not present

## 2020-05-14 DIAGNOSIS — E1169 Type 2 diabetes mellitus with other specified complication: Secondary | ICD-10-CM | POA: Diagnosis not present

## 2021-02-28 DIAGNOSIS — E119 Type 2 diabetes mellitus without complications: Secondary | ICD-10-CM | POA: Diagnosis not present

## 2021-07-08 ENCOUNTER — Ambulatory Visit (INDEPENDENT_AMBULATORY_CARE_PROVIDER_SITE_OTHER): Payer: 59 | Admitting: Family Medicine

## 2021-07-08 ENCOUNTER — Encounter: Payer: Self-pay | Admitting: Family Medicine

## 2021-07-08 VITALS — BP 128/86 | Ht 72.5 in | Wt 205.0 lb

## 2021-07-08 DIAGNOSIS — M25512 Pain in left shoulder: Secondary | ICD-10-CM | POA: Diagnosis not present

## 2021-07-08 DIAGNOSIS — G8929 Other chronic pain: Secondary | ICD-10-CM

## 2021-07-08 NOTE — Progress Notes (Signed)
PCP: Georgann Housekeeper, MD  Subjective:   HPI: Patient is a 61 y.o. male here for intermittent L shoulder pain for the past 5-6 months.   He states when he raises his arm or extends it he gets a sharp pain that then goes to a dull burning pain for 10 seconds after he changes positions and will subside on its own. He states the pain started about 5-6 months ago, but worsened about 2 months ago when he was snowboarding and flailed his arms rapidly to prevent from falling. Denies icing it or taking any medication. Denies it waking him up out of his sleep  Still exercising, just more careful about certain upper body exercises that exacerbates his pain.   Past Medical History:  Diagnosis Date   Complication of anesthesia    Heart rate dropped with Knee surgery   Diabetes mellitus without complication (HCC)    Hypertension     Current Outpatient Medications on File Prior to Visit  Medication Sig Dispense Refill   amLODipine-benazepril (LOTREL) 5-20 MG per capsule Take 1 capsule by mouth daily.      aspirin EC 81 MG tablet Take 81 mg by mouth daily.     hydrochlorothiazide (HYDRODIURIL) 25 MG tablet Take 25 mg by mouth daily.     JARDIANCE 10 MG TABS tablet   0   meloxicam (MOBIC) 15 MG tablet Take 1 tablet daily with food for 10 days. Then take as needed. 40 tablet 1   metFORMIN (GLUCOPHAGE) 500 MG tablet Take 1,500 mg by mouth. Takes 2 pills in the am, 1 pill in the pm.     simvastatin (ZOCOR) 10 MG tablet Take 10 mg by mouth at bedtime.     VIAGRA 100 MG tablet Take 100 mg by mouth daily as needed for erectile dysfunction.      No current facility-administered medications on file prior to visit.    Past Surgical History:  Procedure Laterality Date   BACK SURGERY  2006   COLONOSCOPY WITH PROPOFOL N/A 11/10/2016   Procedure: COLONOSCOPY WITH PROPOFOL;  Surgeon: Charolett Bumpers, MD;  Location: [facility];  Service: Endoscopy;  Laterality: N/A;   KNEE CARTILAGE SURGERY  2015   Both knees  done.    No Known Allergies  BP 128/86    Ht 6' 0.5" (1.842 m)    Wt 205 lb (93 kg)    BMI 27.42 kg/m   [facility] Adult Exercise 07/08/2021  Frequency of aerobic exercise (# of days/week) 6  Average time in minutes 40  Frequency of strengthening activities (# of days/week) 3    No flowsheet data found.      Objective:  Physical Exam:  Gen: NAD, comfortable in exam room  L Shoulder: Inspection reveals no obvious deformity, atrophy, or asymmetry b/l. No bruising. No swelling. Palpation is normal with no TTP over [facility] joint or bicipital groove b/l. Full ROM in flexion, abduction, internal/external rotation b/l NV intact distally b/l Normal scapular function observed b/l Special Tests:  - Impingement: Neg Hawkins, neers, empty can sign. - Supraspinatous: Negative empty can - Infraspinatous/Teres Minor: 5/5 strength with ER - Subscapularis: 5/5 strength with IR - Biceps tendon: Negative Speeds, Yerrgason's  - Labrum: Positive Obriens, negative clunk, good stability - AC Joint: Negative cross arm - Negative apprehension test - No painful arc and no drop arm sign    Assessment & Plan:  1. L shoulder pain  Patient with presents with 5-6 months of intermittent sharp L  shoulder pain when he does movements where he raises his L arm above his head and externally rotates. Physical exam remarkable for positive O Briens test. Given history and exam, pain likely due to a labral tear vs less likely rotator cuff impingement. Provided patient with a list of exercises to strengthen his shoulder and discussed that pain should eventually subside on its own but gave return precautions if it does not improve.

## 2021-07-08 NOTE — Patient Instructions (Signed)
You have a small labral tear of your left shoulder. These typically heal well with conservative treatment. Icing, tylenol, ibuprofen only if needed for pain. Modify your exercise as you have been. Do home exercises with scapular strengthening most days of the week until pain resolves. Consider formal physical therapy if not improving. Follow up with [place] in 6 weeks or as needed if you're doing well and improving.

## 2021-10-21 ENCOUNTER — Other Ambulatory Visit: Payer: Self-pay | Admitting: Internal Medicine

## 2021-10-21 DIAGNOSIS — Z8249 Family history of ischemic heart disease and other diseases of the circulatory system: Secondary | ICD-10-CM

## 2021-12-16 ENCOUNTER — Other Ambulatory Visit: Payer: 59

## 2022-02-07 ENCOUNTER — Other Ambulatory Visit: Payer: Self-pay

## 2022-04-28 ENCOUNTER — Other Ambulatory Visit: Payer: 59

## 2022-05-05 ENCOUNTER — Ambulatory Visit
Admission: RE | Admit: 2022-05-05 | Discharge: 2022-05-05 | Disposition: A | Discharge status: Home / Self Care | Payer: No Typology Code available for payment source | Source: Ambulatory Visit | Attending: Internal Medicine | Admitting: Internal Medicine

## 2022-05-05 ENCOUNTER — Other Ambulatory Visit: Payer: 59

## 2022-05-05 DIAGNOSIS — Z8249 Family history of ischemic heart disease and other diseases of the circulatory system: Secondary | ICD-10-CM

## 2022-05-09 ENCOUNTER — Ambulatory Visit: Payer: 59 | Admitting: Interventional Cardiology

## 2022-05-13 NOTE — Progress Notes (Unsigned)
Cardiology Office Note:   Date:  05/15/2022  NAME:  Luis Castillo    MRN: [id number] DOB:  09-20-1960   PCP:  Wenda Low, MD  Cardiologist:  None  Electrophysiologist:  None   Referring MD: Wenda Low, MD   Chief Complaint  Patient presents with   Coronary Artery Disease        History of Present Illness:   [name] is a 61 y.o. male with a hx of DM, CAD, HLD who is being seen today for the evaluation of CAD at the request of Wenda Low, MD. he underwent calcium scoring.  Elevated score.  99th percentile.  He is diabetic.  He has high blood pressure.  He has never had a heart attack or stroke.  His A1c is uncontrolled at 8.9.  He was started on Lipitor.  His EKG demonstrates sinus rhythm with left axis deviation and LVH by voltage.  He reports he runs 4 miles per day.  He has no chest pain or shortness of breath.  There is no family history of heart disease.  He presents with his wife.  They are concerned.  Rightfully so.  We discussed cardiac PET stress test to make sure there are no blockages.  Other than that I have recommended medical therapy.  His aorta is likely close to the upper limits of normal.  His examination is normal.  He is a never smoker.  No alcohol drug use.  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  He presents with his wife.  They are married.  They have 3 children.  They have several grandchildren.  Their son is an ENT doctor.   Problem List CAD -Coronary calcium score 2959 (99th percentile) 2. DM -A1c 8.9 3. HLD -T chol 133, HDL 40, LDL 75, TG 99 4. HTN 5. Aortic Aneurysm -40 mm 05/2022  Past Medical History: Past Medical History:  Diagnosis Date   Complication of anesthesia    Heart rate dropped with Knee surgery   Coronary artery disease    Diabetes mellitus without complication ([place])    Hypertension     Past Surgical History: Past Surgical History:  Procedure Laterality Date   BACK SURGERY  2006   COLONOSCOPY  WITH PROPOFOL N/A 11/10/2016   Procedure: COLONOSCOPY WITH PROPOFOL;  Surgeon: Garlan Fair, MD;  Location: [facility];  Service: Endoscopy;  Laterality: N/A;   KNEE CARTILAGE SURGERY  2015   Both knees done.    Current Medications: Current Meds  Medication Sig   amLODipine-benazepril (LOTREL) 5-20 MG per capsule Take 1 capsule by mouth daily.    aspirin EC 81 MG tablet Take 81 mg by mouth daily.   atorvastatin (LIPITOR) 40 MG tablet Take 40 mg by mouth daily.   glimepiride (AMARYL) 2 MG tablet Take 2 mg by mouth every morning.   hydrochlorothiazide (HYDRODIURIL) 25 MG tablet Take 25 mg by mouth daily.   JARDIANCE 10 MG TABS tablet    meloxicam (MOBIC) 15 MG tablet Take 1 tablet daily with food for 10 days. Then take as needed.   metFORMIN (GLUCOPHAGE) 500 MG tablet Take 1,500 mg by mouth. Takes 2 pills in the am, 1 pill in the pm.   VIAGRA 100 MG tablet Take 100 mg by mouth daily as needed for erectile dysfunction.      Allergies:    Patient has no known allergies.   Social History: Social History   Socioeconomic History   Marital status: Married  Spouse name: Not on file   Number of children: 3   Years of education: Not on file   Highest education level: Not on file  Occupational History   Occupation: COO Catering manager  Tobacco Use   Smoking status: Never   Smokeless tobacco: Never  Vaping Use   Vaping Use: Never used  Substance and Sexual Activity   Alcohol use: Yes    Alcohol/week: 2.0 standard drinks of alcohol    Types: 2 Cans of beer per week    Comment: Weekly   Drug use: No   Sexual activity: Yes  Other Topics Concern   Not on file  Social History Narrative   Not on file   Social Determinants of Health   Financial Resource Strain: Not on file  Food Insecurity: Not on file  Transportation Needs: Not on file  Physical Activity: Not on file  Stress: Not on file  Social Connections: Not on file     Family History: The patient's family  history includes Cancer in his father; Heart attack in his sister.  ROS:   All other ROS reviewed and negative. Pertinent positives noted in the HPI.     EKGs/Labs/Other Studies Reviewed:   The following studies were personally reviewed by me today:  EKG:  EKG is ordered today.  The ekg ordered today demonstrates normal sinus rhythm heart rate 72, LVH by voltage, and was personally reviewed by me.   Recent Labs: No results found for requested labs within last 365 days.   Recent Lipid Panel No results found for: "CHOL", "TRIG", "HDL", "CHOLHDL", "VLDL", "LDLCALC", "LDLDIRECT"  Physical Exam:   VS:  BP (!) 162/92   Pulse 72   Ht 6' 0.5" (1.842 m)   Wt 216 lb (98 kg)   SpO2 98%   BMI 28.89 kg/m    Wt Readings from Last 3 Encounters:  05/15/22 216 lb (98 kg)  07/08/21 205 lb (93 kg)  01/18/18 213 lb (96.6 kg)    General: Well nourished, well developed, in no acute distress Head: Atraumatic, normal size  Eyes: PEERLA, EOMI  Neck: Supple, no JVD Endocrine: No thryomegaly Cardiac: Normal S1, S2; RRR; no murmurs, rubs, or gallops Lungs: Clear to auscultation bilaterally, no wheezing, rhonchi or rales  Abd: Soft, nontender, no hepatomegaly  Ext: No edema, pulses 2+ Musculoskeletal: No deformities, BUE and BLE strength normal and equal Skin: Warm and dry, no rashes   Neuro: Alert and oriented to person, place, time, and situation, CNII-XII grossly intact, no focal deficits  Psych: Normal mood and affect   ASSESSMENT:   Luis Castillo is a 61 y.o. male who presents for the following: 1. Coronary artery disease involving native coronary artery of native heart without angina pectoris   2. Agatston coronary artery calcium score greater than 400   3. Mixed hyperlipidemia   4. Ascending aorta dilation (HCC)     PLAN:   1. Coronary artery disease involving native coronary artery of native heart without angina pectoris 2. Agatston coronary artery calcium score greater than 400 3.  Mixed hyperlipidemia -Elevated coronary calcium score in the 99th percentile.  No chest pain or trouble breathing.  Able to exercise 4 miles without limitations.  He can run on a treadmill and do the elliptical.  His biggest risk factor for CAD is uncontrolled diabetes with an A1c of 8.9.  He is going to work on this.  His EKG is reassuring as this just shows LVH.  His cardiovascular  examination is normal.  He is high risk for obstructive CAD.  But has no symptoms.  I have recommended a cardiac PET stress test for further evaluation.  I would also like for him to get an echocardiogram.  He will start a baby aspirin.  He will continue Lipitor 40 mg daily.  We will plan to recheck his cholesterol and see him back in 3 months.  4. Ascending aorta dilation (HCC) -40 mm.  Approaching the upper limits of normal for his age.  Recheck in 1 year.  May be able to follow this with echo.  Shared Decision Making/Informed Consent The risks [chest pain, shortness of breath, cardiac arrhythmias, dizziness, blood pressure fluctuations, myocardial infarction, stroke/transient ischemic attack, nausea, vomiting, allergic reaction, radiation exposure, metallic taste sensation and life-threatening complications (estimated to be 1 in 10,000)], benefits (risk stratification, diagnosing coronary artery disease, treatment guidance) and alternatives of a cardiac PET stress test were discussed in detail with Mr. Guevarra and he agrees to proceed.  Disposition: Return in about 3 months (around 08/14/2022).  Medication Adjustments/Labs and Tests Ordered: Current medicines are reviewed at length with the patient today.  Concerns regarding medicines are outlined above.  Orders Placed This Encounter  Procedures   NM PET CT CARDIAC PERFUSION MULTI W/ABSOLUTE BLOODFLOW   EKG 12-Lead   ECHOCARDIOGRAM COMPLETE   No orders of the defined types were placed in this encounter.   Patient Instructions  Medication Instructions:  The  current medical regimen is effective;  continue present plan and medications.  *If you need a refill on your cardiac medications before your next appointment, please call your pharmacy*   Testing/Procedures: CARDIAC PET- Your physician has requested that you have a Cardiac Pet Stress Test. This testing is completed at [facility] ([address] [place], [place] [address]). The schedulers will call you to get this scheduled. Please follow instructions below and call the office with any questions/concerns [phone]).  Echocardiogram - Your physician has requested that you have an echocardiogram. Echocardiography is a painless test that uses sound waves to create images of your heart. It provides your doctor with information about the size and shape of your heart and how well your heart's chambers and valves are working. This procedure takes approximately one hour. There are no restrictions for this procedure.    Follow-Up: At [facility], you and your health needs are our priority.  As part of our continuing mission to provide you with exceptional heart care, we have created designated Provider Care Teams.  These Care Teams include your primary Cardiologist (physician) and Advanced Practice Providers (APPs -  Physician Assistants and Nurse Practitioners) who all work together to provide you with the care you need, when you need it.  We recommend signing up for the patient portal called "MyChart".  Sign up information is provided on this After Visit Summary.  MyChart is used to connect with patients for Virtual Visits (Telemedicine).  Patients are able to view lab/test results, encounter notes, upcoming appointments, etc.  Non-urgent messages can be sent to your provider as well.   To learn more about what you can do with MyChart, go to [email or web address].    Your next appointment:   3 month(s)  The format for your next appointment:   In Person  Provider:    Lennie Odor, MD    How to Prepare for Your Cardiac PET/CT Stress Test:  1. Please do not take these medications before your test:   Medications  that may interfere with the cardiac pharmacological stress agent (ex. nitrates - including erectile dysfunction medications, isosorbide mononitrate or beta-blockers) the day of the exam. (Erectile dysfunction medication should be held for at least 72 hrs prior to test) Theophylline containing medications for 12 hours. Dipyridamole 48 hours prior to the test. Your remaining medications may be taken with water.  2. Nothing to eat or drink, except water, 3 hours prior to arrival time.   NO caffeine/decaffeinated products, or chocolate 12 hours prior to arrival.  3. NO perfume, cologne or lotion  4. Total time is 1 to 2 hours; you may want to bring reading material for the waiting time.  5. Please report to Admitting at the [facility] Main Entrance 30 minutes early for your test.  [place], [place] [address]  Diabetic Preparation:  Hold oral medications. You may take NPH and Lantus insulin. Do not take Humalog or Humulin R (Regular Insulin) the day of your test. Check blood sugars prior to leaving the house. If able to eat breakfast prior to 3 hour fasting, you may take all medications, including your insulin, Do not worry if you miss your breakfast dose of insulin - start at your next meal.  IF YOU THINK YOU MAY BE PREGNANT, OR ARE NURSING PLEASE INFORM THE TECHNOLOGIST.  In preparation for your appointment, medication and supplies will be purchased.  Appointment availability is limited, so if you need to cancel or reschedule, please call the Radiology Department at [phone]  24 hours in advance to avoid a cancellation fee of $100.00  What to Expect After you Arrive:  Once you arrive and check in for your appointment, you will be taken to a preparation room within the Radiology Department.  A technologist  or Nurse will obtain your medical history, verify that you are correctly prepped for the exam, and explain the procedure.  Afterwards,  an IV will be started in your arm and electrodes will be placed on your skin for EKG monitoring during the stress portion of the exam. Then you will be escorted to the PET/CT scanner.  There, staff will get you positioned on the scanner and obtain a blood pressure and EKG.  During the exam, you will continue to be connected to the EKG and blood pressure machines.  A small, safe amount of a radioactive tracer will be injected in your IV to obtain a series of pictures of your heart along with an injection of a stress agent.    After your Exam:  It is recommended that you eat a meal and drink a caffeinated beverage to counter act any effects of the stress agent.  Drink plenty of fluids for the remainder of the day and urinate frequently for the first couple of hours after the exam.  Your doctor will inform you of your test results within 7-10 business days.  For questions about your test or how to prepare for your test, please call: Marchia Bond, Cardiac Imaging Nurse Navigator  Gordy Clement, Cardiac Imaging Nurse Navigator Office: [phone]      Signed, Addison Naegeli. Audie Box, MD, [place]  [address] [place], [place] [address] [phone]  05/15/2022 3:40 PM

## 2022-05-15 ENCOUNTER — Ambulatory Visit: Payer: 59 | Attending: Interventional Cardiology | Admitting: Cardiovascular Disease

## 2022-05-15 ENCOUNTER — Encounter: Payer: Self-pay | Admitting: Cardiovascular Disease

## 2022-05-15 VITALS — BP 162/92 | HR 72 | Ht 72.5 in | Wt 216.0 lb

## 2022-05-15 DIAGNOSIS — R931 Abnormal findings on diagnostic imaging of heart and coronary circulation: Secondary | ICD-10-CM | POA: Diagnosis not present

## 2022-05-15 DIAGNOSIS — E782 Mixed hyperlipidemia: Secondary | ICD-10-CM | POA: Diagnosis not present

## 2022-05-15 DIAGNOSIS — I2510 Atherosclerotic heart disease of native coronary artery without angina pectoris: Secondary | ICD-10-CM

## 2022-05-15 DIAGNOSIS — I77810 Thoracic aortic ectasia: Secondary | ICD-10-CM | POA: Diagnosis not present

## 2022-05-15 NOTE — Patient Instructions (Signed)
Medication Instructions:  The current medical regimen is effective;  continue present plan and medications.  *If you need a refill on your cardiac medications before your next appointment, please call your pharmacy*   Testing/Procedures: CARDIAC PET- Your physician has requested that you have a Cardiac Pet Stress Test. This testing is completed at [facility] ([address] [place], [place] [address]). The schedulers will call you to get this scheduled. Please follow instructions below and call the office with any questions/concerns [phone]).  Echocardiogram - Your physician has requested that you have an echocardiogram. Echocardiography is a painless test that uses sound waves to create images of your heart. It provides your doctor with information about the size and shape of your heart and how well your heart's chambers and valves are working. This procedure takes approximately one hour. There are no restrictions for this procedure.    Follow-Up: At [facility], you and your health needs are our priority.  As part of our continuing mission to provide you with exceptional heart care, we have created designated Provider Care Teams.  These Care Teams include your primary Cardiologist (physician) and Advanced Practice Providers (APPs -  Physician Assistants and Nurse Practitioners) who all work together to provide you with the care you need, when you need it.  We recommend signing up for the patient portal called "MyChart".  Sign up information is provided on this After Visit Summary.  MyChart is used to connect with patients for Virtual Visits (Telemedicine).  Patients are able to view lab/test results, encounter notes, upcoming appointments, etc.  Non-urgent messages can be sent to your provider as well.   To learn more about what you can do with MyChart, go to [email or web address].    Your next appointment:   3 month(s)  The format for your next  appointment:   In Person  Provider:   Lennie Odor, MD    How to Prepare for Your Cardiac PET/CT Stress Test:  1. Please do not take these medications before your test:   Medications that may interfere with the cardiac pharmacological stress agent (ex. nitrates - including erectile dysfunction medications, isosorbide mononitrate or beta-blockers) the day of the exam. (Erectile dysfunction medication should be held for at least 72 hrs prior to test) Theophylline containing medications for 12 hours. Dipyridamole 48 hours prior to the test. Your remaining medications may be taken with water.  2. Nothing to eat or drink, except water, 3 hours prior to arrival time.   NO caffeine/decaffeinated products, or chocolate 12 hours prior to arrival.  3. NO perfume, cologne or lotion  4. Total time is 1 to 2 hours; you may want to bring reading material for the waiting time.  5. Please report to Admitting at the [facility] Main Entrance 30 minutes early for your test.  [address] [place], [place] [address]  Diabetic Preparation:  Hold oral medications. You may take NPH and Lantus insulin. Do not take Humalog or Humulin R (Regular Insulin) the day of your test. Check blood sugars prior to leaving the house. If able to eat breakfast prior to 3 hour fasting, you may take all medications, including your insulin, Do not worry if you miss your breakfast dose of insulin - start at your next meal.  IF YOU THINK YOU MAY BE PREGNANT, OR ARE NURSING PLEASE INFORM THE TECHNOLOGIST.  In preparation for your appointment, medication and supplies will be purchased.  Appointment availability is limited, so if  you need to cancel or reschedule, please call the Radiology Department at [phone]  24 hours in advance to avoid a cancellation fee of $100.00  What to Expect After you Arrive:  Once you arrive and check in for your appointment, you will be taken to a preparation room within  the Radiology Department.  A technologist or Nurse will obtain your medical history, verify that you are correctly prepped for the exam, and explain the procedure.  Afterwards,  an IV will be started in your arm and electrodes will be placed on your skin for EKG monitoring during the stress portion of the exam. Then you will be escorted to the PET/CT scanner.  There, staff will get you positioned on the scanner and obtain a blood pressure and EKG.  During the exam, you will continue to be connected to the EKG and blood pressure machines.  A small, safe amount of a radioactive tracer will be injected in your IV to obtain a series of pictures of your heart along with an injection of a stress agent.    After your Exam:  It is recommended that you eat a meal and drink a caffeinated beverage to counter act any effects of the stress agent.  Drink plenty of fluids for the remainder of the day and urinate frequently for the first couple of hours after the exam.  Your doctor will inform you of your test results within 7-10 business days.  For questions about your test or how to prepare for your test, please call: Rockwell Alexandria, Cardiac Imaging Nurse Navigator  Larey Brick, Cardiac Imaging Nurse Navigator Office: [phone]

## 2022-05-20 ENCOUNTER — Encounter: Payer: Self-pay | Admitting: Cardiovascular Disease

## 2022-06-10 ENCOUNTER — Ambulatory Visit (HOSPITAL_COMMUNITY): Payer: 59

## 2022-06-17 ENCOUNTER — Ambulatory Visit (HOSPITAL_COMMUNITY): Payer: 59 | Attending: Cardiovascular Disease

## 2022-06-17 DIAGNOSIS — I517 Cardiomegaly: Secondary | ICD-10-CM | POA: Diagnosis not present

## 2022-06-17 DIAGNOSIS — I77810 Thoracic aortic ectasia: Secondary | ICD-10-CM | POA: Diagnosis not present

## 2022-06-17 DIAGNOSIS — I2510 Atherosclerotic heart disease of native coronary artery without angina pectoris: Secondary | ICD-10-CM

## 2022-06-17 DIAGNOSIS — I5030 Unspecified diastolic (congestive) heart failure: Secondary | ICD-10-CM | POA: Diagnosis not present

## 2022-06-17 LAB — ECHOCARDIOGRAM COMPLETE
Area-P 1/2: 3.6 cm2
S' Lateral: 3.4 cm

## 2022-06-26 ENCOUNTER — Encounter (HOSPITAL_COMMUNITY): Payer: Self-pay | Admitting: Cardiovascular Disease

## 2022-06-26 ENCOUNTER — Other Ambulatory Visit: Payer: Self-pay

## 2022-06-26 DIAGNOSIS — R931 Abnormal findings on diagnostic imaging of heart and coronary circulation: Secondary | ICD-10-CM

## 2022-06-26 DIAGNOSIS — I2510 Atherosclerotic heart disease of native coronary artery without angina pectoris: Secondary | ICD-10-CM

## 2022-06-30 ENCOUNTER — Telehealth (HOSPITAL_COMMUNITY): Payer: Self-pay | Admitting: *Deleted

## 2022-06-30 NOTE — Telephone Encounter (Signed)
Left message on voicemail per DPR in reference to upcoming appointment scheduled on 07/02/22 with detailed instructions given per Myocardial Perfusion Study Information Sheet for the test. LM to arrive 15 minutes early, and that it is imperative to arrive on time for appointment to keep from having the test rescheduled. If you need to cancel or reschedule your appointment, please call the office within 24 hours of your appointment. Failure to do so may result in a cancellation of your appointment, and a $50 no show fee. Phone number given for call back for any questions. Kirstie Peri

## 2022-07-01 ENCOUNTER — Encounter (HOSPITAL_COMMUNITY): Payer: 59

## 2022-07-02 ENCOUNTER — Ambulatory Visit (HOSPITAL_COMMUNITY): Payer: 59 | Attending: Cardiology

## 2022-07-02 DIAGNOSIS — R931 Abnormal findings on diagnostic imaging of heart and coronary circulation: Secondary | ICD-10-CM

## 2022-07-02 DIAGNOSIS — I2510 Atherosclerotic heart disease of native coronary artery without angina pectoris: Secondary | ICD-10-CM | POA: Insufficient documentation

## 2022-07-02 LAB — MYOCARDIAL PERFUSION IMAGING
LV dias vol: 144 mL (ref 62–150)
LV sys vol: 77 mL
Nuc Stress EF: 47 %
Peak HR: 79 {beats}/min
Rest HR: 54 {beats}/min
Rest Nuclear Isotope Dose: 10.1 mCi
SDS: 7
SRS: 3
SSS: 10
ST Depression (mm): 0 mm
Stress Nuclear Isotope Dose: 31.9 mCi
TID: 1.13

## 2022-07-02 MED ORDER — TECHNETIUM TC 99M TETROFOSMIN IV KIT
10.1000 | PACK | Freq: Once | INTRAVENOUS | Status: AC | PRN
  Administered 2022-07-02: 10.1 via INTRAVENOUS

## 2022-07-02 MED ORDER — TECHNETIUM TC 99M TETROFOSMIN IV KIT
31.9000 | PACK | Freq: Once | INTRAVENOUS | Status: AC | PRN
  Administered 2022-07-02: 31.9 via INTRAVENOUS

## 2022-07-02 MED ORDER — REGADENOSON 0.4 MG/5ML IV SOLN
0.4000 mg | Freq: Once | INTRAVENOUS | Status: AC
  Administered 2022-07-02: 0.4 mg via INTRAVENOUS

## 2022-07-03 ENCOUNTER — Telehealth: Payer: Self-pay | Admitting: Cardiovascular Disease

## 2022-07-03 NOTE — Telephone Encounter (Signed)
Called to discuss nuclear stress test findings with Luis Castillo.  Concerning for prior myocardial infarction.  Echocardiogram normal.  No symptoms of chest pain.  He is diabetic and does have an elevated coronary calcium scoring.  We discussed invasive angiography to evaluate this further.  We will plan to see him in the office on Wednesday, 07/09/2022.  We will then arrange coronary angiography.  He is on aspirin.  He is on cholesterol medication.  He is without chest pain symptoms.  We will discuss further in the office next week.  Best,   [place] T. Audie Box, MD, [place]  [address], [place], [place] [address] [phone]  3:09 PM

## 2022-07-04 NOTE — Telephone Encounter (Signed)
He is scheduled for a visit on 02/07 at 8:00 AM.   Thanks!

## 2022-07-06 NOTE — H&P (View-Only) (Signed)
Cardiology Office Note:   Date:  07/09/2022  NAME:  Luis Castillo    MRN: [id number] DOB:  1960-06-05   PCP:  Wenda Low, MD  Cardiologist:  None  Electrophysiologist:  None   Referring MD: Wenda Low, MD   Chief Complaint  Patient presents with   Follow-up        History of Present Illness:   Luis Castillo is a 62 y.o. male with a hx of CAD who presents for follow-up. Stress test abnormal. Needs LHC.   He reports no chest pain or pressure.  No discomfort.  On further interrogation he had an episode 9 months ago.  Had a spell where he woke up and felt dizzy and nauseous.  He may have had a cardiac event then.  Nuclear medicine stress test was concerning for prior infarction.  He reports no discomfort.  He is diabetic.  A1c 7.9.  LDL cholesterol 57.  Blood pressure is elevated today but he reports he is nervous about his recent findings.  Denies chest pain or trouble breathing in office today.  Problem List CAD -Coronary calcium score 2959 (99th percentile) 2. DM -A1c 7.9 3. HLD -T chol 124, HDL 38, LDL 57, triglycerides 175 4. HTN 5. Aortic Aneurysm -40 mm 05/2022  Past Medical History: Past Medical History:  Diagnosis Date   Complication of anesthesia    Heart rate dropped with Knee surgery   Coronary artery disease    Diabetes mellitus without complication ([place])    Hypertension     Past Surgical History: Past Surgical History:  Procedure Laterality Date   BACK SURGERY  2006   COLONOSCOPY WITH PROPOFOL N/A 11/10/2016   Procedure: COLONOSCOPY WITH PROPOFOL;  Surgeon: Garlan Fair, MD;  Location: [facility];  Service: Endoscopy;  Laterality: N/A;   KNEE CARTILAGE SURGERY  2015   Both knees done.    Current Medications: Current Meds  Medication Sig   amLODipine-benazepril (LOTREL) 5-20 MG per capsule Take 1 capsule by mouth daily.    aspirin EC 81 MG tablet Take 81 mg by mouth daily.   atorvastatin (LIPITOR) 40 MG tablet Take 40 mg by mouth  daily.   glimepiride (AMARYL) 2 MG tablet Take 2 mg by mouth every morning.   hydrochlorothiazide (HYDRODIURIL) 25 MG tablet Take 25 mg by mouth daily.   JARDIANCE 10 MG TABS tablet    meloxicam (MOBIC) 15 MG tablet Take 1 tablet daily with food for 10 days. Then take as needed.   metFORMIN (GLUCOPHAGE) 500 MG tablet Take 1,500 mg by mouth. Takes 2 pills in the am, 1 pill in the pm.   VIAGRA 100 MG tablet Take 100 mg by mouth daily as needed for erectile dysfunction.      Allergies:    Patient has no known allergies.   Social History: Social History   Socioeconomic History   Marital status: Married    Spouse name: Not on file   Number of children: 3   Years of education: Not on file   Highest education level: Not on file  Occupational History   Occupation: COO Catering manager  Tobacco Use   Smoking status: Never   Smokeless tobacco: Never  Vaping Use   Vaping Use: Never used  Substance and Sexual Activity   Alcohol use: Yes    Alcohol/week: 2.0 standard drinks of alcohol    Types: 2 Cans of beer per week    Comment: Weekly   Drug use: No  Sexual activity: Yes  Other Topics Concern   Not on file  Social History Narrative   Not on file   Social Determinants of Health   Financial Resource Strain: Not on file  Food Insecurity: Not on file  Transportation Needs: Not on file  Physical Activity: Not on file  Stress: Not on file  Social Connections: Not on file     Family History: The patient's family history includes Cancer in his father; Heart attack in his sister.  ROS:   All other ROS reviewed and negative. Pertinent positives noted in the HPI.     EKGs/Labs/Other Studies Reviewed:   The following studies were personally reviewed by me today:  EKG:  EKG is ordered today.  The ekg ordered today demonstrates normal sinus rhythm heart rate 60, no acute ischemic changes or evidence of infarction, and was personally reviewed by me.   TTE 06/17/2022  1. Left  ventricular ejection fraction, by estimation, is 55 to 60%. The  left ventricle has normal function. The left ventricle has no regional  wall motion abnormalities. There is mild concentric left ventricular  hypertrophy. Left ventricular diastolic  parameters are consistent with Grade I diastolic dysfunction (impaired  relaxation).   2. Right ventricular systolic function is normal. The right ventricular  size is normal. Tricuspid regurgitation signal is inadequate for assessing  PA pressure.   3. The mitral valve is normal in structure. Trivial mitral valve  regurgitation. No evidence of mitral stenosis.   4. The aortic valve is tricuspid. Aortic valve regurgitation is not  visualized. No aortic stenosis is present.   5. Aortic dilatation noted. There is mild dilatation of the aortic root,  measuring 41 mm. There is mild dilatation of the ascending aorta,  measuring 41 mm.   6. The inferior vena cava is normal in size with greater than 50%  respiratory variability, suggesting right atrial pressure of 3 mmHg.   NM Stress 07/02/2022   Findings are consistent with infarction with peri-infarct ischemia. The study is intermediate risk.   No ST deviation was noted.   LV perfusion is abnormal. There is evidence of ischemia. Defect 1: There is a medium defect with moderate reduction in uptake present in the apical to mid anterior and apex location(s) that is partially reversible. There is abnormal wall motion in the defect area. Consistent with infarction and peri-infarct ischemia. Defect 2: There is a medium defect with moderate reduction in uptake present in the apical to basal inferior location(s) that is fixed. There is normal wall motion in the defect area.   Left ventricular function is abnormal. Global function is mildly reduced. There were multiple regional abnormalities. Nuclear stress EF: 47 %. The left ventricular ejection fraction is mildly decreased (45-54%). End diastolic cavity size is  mildly enlarged. End systolic cavity size is normal.   Prior study not available for comparison.  Recent Labs: No results found for requested labs within last 365 days.   Recent Lipid Panel No results found for: "CHOL", "TRIG", "HDL", "CHOLHDL", "VLDL", "LDLCALC", "LDLDIRECT"  Physical Exam:   VS:  BP (!) 152/86   Pulse 67   Ht 6' (1.829 m)   Wt 215 lb 12.8 oz (97.9 kg)   SpO2 93%   BMI 29.27 kg/m    Wt Readings from Last 3 Encounters:  07/09/22 215 lb 12.8 oz (97.9 kg)  07/02/22 216 lb (98 kg)  05/15/22 216 lb (98 kg)    General: Well nourished, well developed, in no acute  distress Head: Atraumatic, normal size  Eyes: PEERLA, EOMI  Neck: Supple, no JVD Endocrine: No thryomegaly Cardiac: Normal S1, S2; RRR; no murmurs, rubs, or gallops Lungs: Clear to auscultation bilaterally, no wheezing, rhonchi or rales  Abd: Soft, nontender, no hepatomegaly  Ext: No edema, pulses 2+ Musculoskeletal: No deformities, BUE and BLE strength normal and equal Skin: Warm and dry, no rashes   Neuro: Alert and oriented to person, place, time, and situation, CNII-XII grossly intact, no focal deficits  Psych: Normal mood and affect   ASSESSMENT:   Luis Castillo is a 62 y.o. male who presents for the following: 1. Abnormal nuclear stress test   2. Coronary artery disease involving native coronary artery of native heart without angina pectoris   3. Agatston coronary artery calcium score greater than 400   4. Mixed hyperlipidemia   5. Ascending aorta dilation (HCC)     PLAN:   1. Abnormal nuclear stress test 2. Coronary artery disease involving native coronary artery of native heart without angina pectoris 3. Agatston coronary artery calcium score greater than 400 4. Mixed hyperlipidemia -Elevated calcium score.  Abnormal nuclear medicine perfusion imaging concerning for ischemia in the anterior distribution.  There is also concern for fixed inferior defect concerning for prior inferior  infarct.  EKG shows no acute ischemic changes.  His echo is normal.  He reports no discomfort.  Given his diabetes and elevated coronary calcium score I have recommended invasive angiography to define his coronary anatomy.  He may have obstructive CAD that merit revascularization for mortality benefit.  Given his lack of symptoms revascularization may not be indicated unless he has a severe or critical lesion.  We will set him up for left heart catheterization tomorrow with Dr. Gery Pray. -He reports no chest discomfort.  He will continue aspirin and Lipitor.  He will see me back in 2 weeks for further titration of preventative measures.  5. Ascending aorta dilation (HCC) -40 mm.  Plan to recheck this in 1 year.  Shared Decision Making/Informed Consent The risks [stroke (1 in 1000), death (1 in 1000), kidney failure [usually temporary] (1 in 500), bleeding (1 in 200), allergic reaction [possibly serious] (1 in 200)], benefits (diagnostic support and management of coronary artery disease) and alternatives of a cardiac catheterization were discussed in detail with Luis Castillo and he is willing to proceed.  Disposition: Return in about 2 weeks (around 07/23/2022).  Medication Adjustments/Labs and Tests Ordered: Current medicines are reviewed at length with the patient today.  Concerns regarding medicines are outlined above.  Orders Placed This Encounter  Procedures   CBC   Basic metabolic panel   EKG 12-Lead   No orders of the defined types were placed in this encounter.   Patient Instructions  Medication Instructions:  The current medical regimen is effective;  continue present plan and medications.  *If you need a refill on your cardiac medications before your next appointment, please call your pharmacy*   Lab Work: CBC, BMET today   If you have labs (blood work) drawn today and your tests are completely normal, you will receive your results only by: MyChart Message (if you have MyChart)  OR A paper copy in the mail If you have any lab test that is abnormal or we need to change your treatment, we will call you to review the results.   Testing/Procedures:  Your physician has requested that you have a cardiac catheterization. Cardiac catheterization is used to diagnose and/or treat various heart conditions.  Doctors may recommend this procedure for a number of different reasons. The most common reason is to evaluate chest pain. Chest pain can be a symptom of coronary artery disease (CAD), and cardiac catheterization can show whether plaque is narrowing or blocking your heart's arteries. This procedure is also used to evaluate the valves, as well as measure the blood flow and oxygen levels in different parts of your heart. For further information please visit [email or web address]. Please follow instruction sheet, as given.    Follow-Up: At [facility], you and your health needs are our priority.  As part of our continuing mission to provide you with exceptional heart care, we have created designated Provider Care Teams.  These Care Teams include your primary Cardiologist (physician) and Advanced Practice Providers (APPs -  Physician Assistants and Nurse Practitioners) who all work together to provide you with the care you need, when you need it.  We recommend signing up for the patient portal called "MyChart".  Sign up information is provided on this After Visit Summary.  MyChart is used to connect with patients for Virtual Visits (Telemedicine).  Patients are able to view lab/test results, encounter notes, upcoming appointments, etc.  Non-urgent messages can be sent to your provider as well.   To learn more about what you can do with MyChart, go to [email or web address].    Your next appointment:   February 21st at 9:00 AM  Provider:   Eleonore Chiquito, MD  Other Instructions       Cardiac/Peripheral Catheterization   You are scheduled for a Cardiac Catheterization  on Thursday, February 8 with Dr. Quay Burow.  1. Please arrive at the Main Entrance A at [facility]: [place], [place] [address] on February 8 at 9:30 AM (This time is two hours before your procedure to ensure your preparation). Free valet parking service is available. You will check in at ADMITTING. The support person will be asked to wait in the waiting room.  It is OK to have someone drop you off and come back when you are ready to be discharged.        Special note: Every effort is made to have your procedure done on time. Please understand that emergencies sometimes delay scheduled procedures.   . 2. Diet: Do not eat solid foods after midnight.  You may have clear liquids until 5 AM the day of the procedure.  3. Labs: You will need to have blood drawn today- CBC,BMET.  4. Medication instructions in preparation for your procedure:   Contrast Allergy: No   On the morning of your procedure, take Aspirin 81 mg and any morning medicines NOT listed above.  You may use sips of water.  5. Plan to go home the same day, you will only stay overnight if medically necessary. 6. You MUST have a responsible adult to drive you home. 7. An adult MUST be with you the first 24 hours after you arrive home. 8. Bring a current list of your medications, and the last time and date medication taken. 9. Bring ID and current insurance cards. 10.Please wear clothes that are easy to get on and off and wear slip-on shoes.  Thank you for allowing [place] to care for you!   -- [facility]     Time Spent with Patient: I have spent a total of 35 minutes with patient reviewing hospital notes, telemetry, EKGs, labs and examining the patient as well as establishing an assessment and  plan that was discussed with the patient.  > 50% of time was spent in direct patient care.  Signed, Lenna Gilford. Flora Lipps, MD, [facility]  [facility]  [address],  [address] [place], [place] [address] [phone]  07/09/2022 9:04 AM

## 2022-07-06 NOTE — Progress Notes (Signed)
Cardiology Office Note:   Date:  07/09/2022  NAME:  Luis Castillo    MRN: [id number] DOB:  1960-06-05   PCP:  Wenda Low, MD  Cardiologist:  None  Electrophysiologist:  None   Referring MD: Wenda Low, MD   Chief Complaint  Patient presents with   Follow-up        History of Present Illness:   Luis Castillo is a 62 y.o. male with a hx of CAD who presents for follow-up. Stress test abnormal. Needs LHC.   He reports no chest pain or pressure.  No discomfort.  On further interrogation he had an episode 9 months ago.  Had a spell where he woke up and felt dizzy and nauseous.  He may have had a cardiac event then.  Nuclear medicine stress test was concerning for prior infarction.  He reports no discomfort.  He is diabetic.  A1c 7.9.  LDL cholesterol 57.  Blood pressure is elevated today but he reports he is nervous about his recent findings.  Denies chest pain or trouble breathing in office today.  Problem List CAD -Coronary calcium score 2959 (99th percentile) 2. DM -A1c 7.9 3. HLD -T chol 124, HDL 38, LDL 57, triglycerides 175 4. HTN 5. Aortic Aneurysm -40 mm 05/2022  Past Medical History: Past Medical History:  Diagnosis Date   Complication of anesthesia    Heart rate dropped with Knee surgery   Coronary artery disease    Diabetes mellitus without complication ([place])    Hypertension     Past Surgical History: Past Surgical History:  Procedure Laterality Date   BACK SURGERY  2006   COLONOSCOPY WITH PROPOFOL N/A 11/10/2016   Procedure: COLONOSCOPY WITH PROPOFOL;  Surgeon: Garlan Fair, MD;  Location: [facility];  Service: Endoscopy;  Laterality: N/A;   KNEE CARTILAGE SURGERY  2015   Both knees done.    Current Medications: Current Meds  Medication Sig   amLODipine-benazepril (LOTREL) 5-20 MG per capsule Take 1 capsule by mouth daily.    aspirin EC 81 MG tablet Take 81 mg by mouth daily.   atorvastatin (LIPITOR) 40 MG tablet Take 40 mg by mouth  daily.   glimepiride (AMARYL) 2 MG tablet Take 2 mg by mouth every morning.   hydrochlorothiazide (HYDRODIURIL) 25 MG tablet Take 25 mg by mouth daily.   JARDIANCE 10 MG TABS tablet    meloxicam (MOBIC) 15 MG tablet Take 1 tablet daily with food for 10 days. Then take as needed.   metFORMIN (GLUCOPHAGE) 500 MG tablet Take 1,500 mg by mouth. Takes 2 pills in the am, 1 pill in the pm.   VIAGRA 100 MG tablet Take 100 mg by mouth daily as needed for erectile dysfunction.      Allergies:    Patient has no known allergies.   Social History: Social History   Socioeconomic History   Marital status: Married    Spouse name: Not on file   Number of children: 3   Years of education: Not on file   Highest education level: Not on file  Occupational History   Occupation: COO Catering manager  Tobacco Use   Smoking status: Never   Smokeless tobacco: Never  Vaping Use   Vaping Use: Never used  Substance and Sexual Activity   Alcohol use: Yes    Alcohol/week: 2.0 standard drinks of alcohol    Types: 2 Cans of beer per week    Comment: Weekly   Drug use: No  Sexual activity: Yes  Other Topics Concern   Not on file  Social History Narrative   Not on file   Social Determinants of Health   Financial Resource Strain: Not on file  Food Insecurity: Not on file  Transportation Needs: Not on file  Physical Activity: Not on file  Stress: Not on file  Social Connections: Not on file     Family History: The patient's family history includes Cancer in his father; Heart attack in his sister.  ROS:   All other ROS reviewed and negative. Pertinent positives noted in the HPI.     EKGs/Labs/Other Studies Reviewed:   The following studies were personally reviewed by me today:  EKG:  EKG is ordered today.  The ekg ordered today demonstrates normal sinus rhythm heart rate 60, no acute ischemic changes or evidence of infarction, and was personally reviewed by me.   TTE 06/17/2022  1. Left  ventricular ejection fraction, by estimation, is 55 to 60%. The  left ventricle has normal function. The left ventricle has no regional  wall motion abnormalities. There is mild concentric left ventricular  hypertrophy. Left ventricular diastolic  parameters are consistent with Grade I diastolic dysfunction (impaired  relaxation).   2. Right ventricular systolic function is normal. The right ventricular  size is normal. Tricuspid regurgitation signal is inadequate for assessing  PA pressure.   3. The mitral valve is normal in structure. Trivial mitral valve  regurgitation. No evidence of mitral stenosis.   4. The aortic valve is tricuspid. Aortic valve regurgitation is not  visualized. No aortic stenosis is present.   5. Aortic dilatation noted. There is mild dilatation of the aortic root,  measuring 41 mm. There is mild dilatation of the ascending aorta,  measuring 41 mm.   6. The inferior vena cava is normal in size with greater than 50%  respiratory variability, suggesting right atrial pressure of 3 mmHg.   NM Stress 07/02/2022   Findings are consistent with infarction with peri-infarct ischemia. The study is intermediate risk.   No ST deviation was noted.   LV perfusion is abnormal. There is evidence of ischemia. Defect 1: There is a medium defect with moderate reduction in uptake present in the apical to mid anterior and apex location(s) that is partially reversible. There is abnormal wall motion in the defect area. Consistent with infarction and peri-infarct ischemia. Defect 2: There is a medium defect with moderate reduction in uptake present in the apical to basal inferior location(s) that is fixed. There is normal wall motion in the defect area.   Left ventricular function is abnormal. Global function is mildly reduced. There were multiple regional abnormalities. Nuclear stress EF: 47 %. The left ventricular ejection fraction is mildly decreased (45-54%). End diastolic cavity size is  mildly enlarged. End systolic cavity size is normal.   Prior study not available for comparison.  Recent Labs: No results found for requested labs within last 365 days.   Recent Lipid Panel No results found for: "CHOL", "TRIG", "HDL", "CHOLHDL", "VLDL", "LDLCALC", "LDLDIRECT"  Physical Exam:   VS:  BP (!) 152/86   Pulse 67   Ht 6' (1.829 m)   Wt 215 lb 12.8 oz (97.9 kg)   SpO2 93%   BMI 29.27 kg/m    Wt Readings from Last 3 Encounters:  07/09/22 215 lb 12.8 oz (97.9 kg)  07/02/22 216 lb (98 kg)  05/15/22 216 lb (98 kg)    General: Well nourished, well developed, in no acute  distress Head: Atraumatic, normal size  Eyes: PEERLA, EOMI  Neck: Supple, no JVD Endocrine: No thryomegaly Cardiac: Normal S1, S2; RRR; no murmurs, rubs, or gallops Lungs: Clear to auscultation bilaterally, no wheezing, rhonchi or rales  Abd: Soft, nontender, no hepatomegaly  Ext: No edema, pulses 2+ Musculoskeletal: No deformities, BUE and BLE strength normal and equal Skin: Warm and dry, no rashes   Neuro: Alert and oriented to person, place, time, and situation, CNII-XII grossly intact, no focal deficits  Psych: Normal mood and affect   ASSESSMENT:   Luis Castillo is a 62 y.o. male who presents for the following: 1. Abnormal nuclear stress test   2. Coronary artery disease involving native coronary artery of native heart without angina pectoris   3. [place] coronary artery calcium score greater than 400   4. Mixed hyperlipidemia   5. Ascending aorta dilation (HCC)     PLAN:   1. Abnormal nuclear stress test 2. Coronary artery disease involving native coronary artery of native heart without angina pectoris 3. [place] coronary artery calcium score greater than 400 4. Mixed hyperlipidemia -Elevated calcium score.  Abnormal nuclear medicine perfusion imaging concerning for ischemia in the anterior distribution.  There is also concern for fixed inferior defect concerning for prior inferior  infarct.  EKG shows no acute ischemic changes.  His echo is normal.  He reports no discomfort.  Given his diabetes and elevated coronary calcium score I have recommended invasive angiography to define his coronary anatomy.  He may have obstructive CAD that merit revascularization for mortality benefit.  Given his lack of symptoms revascularization may not be indicated unless he has a severe or critical lesion.  We will set him up for left heart catheterization tomorrow with Dr. Gery Pray. -He reports no chest discomfort.  He will continue aspirin and Lipitor.  He will see me back in 2 weeks for further titration of preventative measures.  5. Ascending aorta dilation (HCC) -40 mm.  Plan to recheck this in 1 year.  Shared Decision Making/Informed Consent The risks [stroke (1 in 1000), death (1 in 1000), kidney failure [usually temporary] (1 in 500), bleeding (1 in 200), allergic reaction [possibly serious] (1 in 200)], benefits (diagnostic support and management of coronary artery disease) and alternatives of a cardiac catheterization were discussed in detail with Mr. Chaikin and he is willing to proceed.  Disposition: Return in about 2 weeks (around 07/23/2022).  Medication Adjustments/Labs and Tests Ordered: Current medicines are reviewed at length with the patient today.  Concerns regarding medicines are outlined above.  Orders Placed This Encounter  Procedures   CBC   Basic metabolic panel   EKG 12-Lead   No orders of the defined types were placed in this encounter.   Patient Instructions  Medication Instructions:  The current medical regimen is effective;  continue present plan and medications.  *If you need a refill on your cardiac medications before your next appointment, please call your pharmacy*   Lab Work: CBC, BMET today   If you have labs (blood work) drawn today and your tests are completely normal, you will receive your results only by: MyChart Message (if you have MyChart)  OR A paper copy in the mail If you have any lab test that is abnormal or we need to change your treatment, we will call you to review the results.   Testing/Procedures:  Your physician has requested that you have a cardiac catheterization. Cardiac catheterization is used to diagnose and/or treat various heart conditions.  Doctors may recommend this procedure for a number of different reasons. The most common reason is to evaluate chest pain. Chest pain can be a symptom of coronary artery disease (CAD), and cardiac catheterization can show whether plaque is narrowing or blocking your heart's arteries. This procedure is also used to evaluate the valves, as well as measure the blood flow and oxygen levels in different parts of your heart. For further information please visit [email or web address]. Please follow instruction sheet, as given.    Follow-Up: At [facility], you and your health needs are our priority.  As part of our continuing mission to provide you with exceptional heart care, we have created designated Provider Care Teams.  These Care Teams include your primary Cardiologist (physician) and Advanced Practice Providers (APPs -  Physician Assistants and Nurse Practitioners) who all work together to provide you with the care you need, when you need it.  We recommend signing up for the patient portal called "MyChart".  Sign up information is provided on this After Visit Summary.  MyChart is used to connect with patients for Virtual Visits (Telemedicine).  Patients are able to view lab/test results, encounter notes, upcoming appointments, etc.  Non-urgent messages can be sent to your provider as well.   To learn more about what you can do with MyChart, go to [email or web address].    Your next appointment:   February 21st at 9:00 AM  Provider:   Eleonore Chiquito, MD  Other Instructions       Cardiac/Peripheral Catheterization   You are scheduled for a Cardiac Catheterization  on Thursday, February 8 with Dr. Quay Burow.  1. Please arrive at the Main Entrance A at [facility]: [place], [place] [address] on February 8 at 9:30 AM (This time is two hours before your procedure to ensure your preparation). Free valet parking service is available. You will check in at ADMITTING. The support person will be asked to wait in the waiting room.  It is OK to have someone drop you off and come back when you are ready to be discharged.        Special note: Every effort is made to have your procedure done on time. Please understand that emergencies sometimes delay scheduled procedures.   . 2. Diet: Do not eat solid foods after midnight.  You may have clear liquids until 5 AM the day of the procedure.  3. Labs: You will need to have blood drawn today- CBC,BMET.  4. Medication instructions in preparation for your procedure:   Contrast Allergy: No   On the morning of your procedure, take Aspirin 81 mg and any morning medicines NOT listed above.  You may use sips of water.  5. Plan to go home the same day, you will only stay overnight if medically necessary. 6. You MUST have a responsible adult to drive you home. 7. An adult MUST be with you the first 24 hours after you arrive home. 8. Bring a current list of your medications, and the last time and date medication taken. 9. Bring ID and current insurance cards. 10.Please wear clothes that are easy to get on and off and wear slip-on shoes.  Thank you for allowing [place] to care for you!   -- [facility]     Time Spent with Patient: I have spent a total of 35 minutes with patient reviewing hospital notes, telemetry, EKGs, labs and examining the patient as well as establishing an assessment and  plan that was discussed with the patient.  > 50% of time was spent in direct patient care.  Signed, Lenna Gilford. Flora Lipps, MD, [facility]  [facility]  [address]  [address] [place], [place] [address] [phone]  07/09/2022 9:04 AM

## 2022-07-09 ENCOUNTER — Ambulatory Visit: Payer: 59 | Attending: Cardiovascular Disease | Admitting: Cardiovascular Disease

## 2022-07-09 ENCOUNTER — Encounter: Payer: Self-pay | Admitting: Cardiovascular Disease

## 2022-07-09 VITALS — BP 152/86 | HR 67 | Ht 72.0 in | Wt 215.8 lb

## 2022-07-09 DIAGNOSIS — R931 Abnormal findings on diagnostic imaging of heart and coronary circulation: Secondary | ICD-10-CM

## 2022-07-09 DIAGNOSIS — E782 Mixed hyperlipidemia: Secondary | ICD-10-CM | POA: Diagnosis not present

## 2022-07-09 DIAGNOSIS — R9439 Abnormal result of other cardiovascular function study: Secondary | ICD-10-CM

## 2022-07-09 DIAGNOSIS — I2510 Atherosclerotic heart disease of native coronary artery without angina pectoris: Secondary | ICD-10-CM | POA: Diagnosis not present

## 2022-07-09 DIAGNOSIS — I77810 Thoracic aortic ectasia: Secondary | ICD-10-CM

## 2022-07-09 LAB — CBC
Hematocrit: 49.2 % (ref 37.5–51.0)
Hemoglobin: 17 g/dL (ref 13.0–17.7)
MCH: 31.3 pg (ref 26.6–33.0)
MCHC: 34.6 g/dL (ref 31.5–35.7)
MCV: 90 fL (ref 79–97)
Platelets: 186 10*3/uL (ref 150–450)
RBC: 5.44 x10E6/uL (ref 4.14–5.80)
RDW: 14 % (ref 11.6–15.4)
WBC: 8.4 10*3/uL (ref 3.4–10.8)

## 2022-07-09 LAB — BASIC METABOLIC PANEL
BUN/Creatinine Ratio: 20 (ref 10–24)
BUN: 17 mg/dL (ref 8–27)
CO2: 28 mmol/L (ref 20–29)
Calcium: 9.7 mg/dL (ref 8.6–10.2)
Chloride: 101 mmol/L (ref 96–106)
Creatinine, Ser: 0.86 mg/dL (ref 0.76–1.27)
Glucose: 192 mg/dL — ABNORMAL HIGH (ref 70–99)
Potassium: 4.2 mmol/L (ref 3.5–5.2)
Sodium: 140 mmol/L (ref 134–144)
eGFR: 98 mL/min/{1.73_m2} (ref >59)

## 2022-07-09 NOTE — Patient Instructions (Signed)
Medication Instructions:  The current medical regimen is effective;  continue present plan and medications.  *If you need a refill on your cardiac medications before your next appointment, please call your pharmacy*   Lab Work: CBC, BMET today   If you have labs (blood work) drawn today and your tests are completely normal, you will receive your results only by: [place] (if you have MyChart) OR A paper copy in the mail If you have any lab test that is abnormal or we need to change your treatment, we will call you to review the results.   Testing/Procedures:  Your physician has requested that you have a cardiac catheterization. Cardiac catheterization is used to diagnose and/or treat various heart conditions. Doctors may recommend this procedure for a number of different reasons. The most common reason is to evaluate chest pain. Chest pain can be a symptom of coronary artery disease (CAD), and cardiac catheterization can show whether plaque is narrowing or blocking your heart's arteries. This procedure is also used to evaluate the valves, as well as measure the blood flow and oxygen levels in different parts of your heart. For further information please visit [email or web address].[email or web address]. Please follow instruction sheet, as given.    Follow-Up: At [facility], you and your health needs are our priority.  As part of our continuing mission to provide you with exceptional heart care, we have created designated Provider Care Teams.  These Care Teams include your primary Cardiologist (physician) and Advanced Practice Providers (APPs -  Physician Assistants and Nurse Practitioners) who all work together to provide you with the care you need, when you need it.  We recommend signing up for the patient portal called "MyChart".  Sign up information is provided on this After Visit Summary.  MyChart is used to connect with patients for Virtual Visits (Telemedicine).  Patients are able to view  lab/test results, encounter notes, upcoming appointments, etc.  Non-urgent messages can be sent to your provider as well.   To learn more about what you can do with MyChart, go to [email or web address].    Your next appointment:   February 21st at 9:00 AM  Provider:   Eleonore Chiquito, MD  Other Instructions       Cardiac/Peripheral Catheterization   You are scheduled for a Cardiac Catheterization on Thursday, February 8 with Dr. Quay Burow.  1. Please arrive at the Main Entrance A at [facility]: [place], [place] [address] on February 8 at 9:30 AM (This time is two hours before your procedure to ensure your preparation). Free valet parking service is available. You will check in at ADMITTING. The support person will be asked to wait in the waiting room.  It is OK to have someone drop you off and come back when you are ready to be discharged.        Special note: Every effort is made to have your procedure done on time. Please understand that emergencies sometimes delay scheduled procedures.   . 2. Diet: Do not eat solid foods after midnight.  You may have clear liquids until 5 AM the day of the procedure.  3. Labs: You will need to have blood drawn today- CBC,BMET.  4. Medication instructions in preparation for your procedure:   Contrast Allergy: No   On the morning of your procedure, take Aspirin 81 mg and any morning medicines NOT listed above.  You may use sips of water.  5. Plan to go home the same day,  you will only stay overnight if medically necessary. 6. You MUST have a responsible adult to drive you home. 7. An adult MUST be with you the first 24 hours after you arrive home. 8. Bring a current list of your medications, and the last time and date medication taken. 9. Bring ID and current insurance cards. 10.Please wear clothes that are easy to get on and off and wear slip-on shoes.  Thank you for allowing [place] to care for you!   -- [facility]

## 2022-07-10 ENCOUNTER — Encounter (HOSPITAL_COMMUNITY)
Admission: RE | Disposition: A | Discharge status: Home / Self Care | Payer: Self-pay | Source: Home / Self Care | Attending: Cardiovascular Disease

## 2022-07-10 ENCOUNTER — Ambulatory Visit (HOSPITAL_COMMUNITY)
Admission: RE | Admit: 2022-07-10 | Discharge: 2022-07-10 | Disposition: A | Discharge status: Home / Self Care | Payer: 59 | Attending: Cardiovascular Disease | Admitting: Cardiovascular Disease

## 2022-07-10 ENCOUNTER — Other Ambulatory Visit: Payer: Self-pay

## 2022-07-10 DIAGNOSIS — I719 Aortic aneurysm of unspecified site, without rupture: Secondary | ICD-10-CM | POA: Diagnosis not present

## 2022-07-10 DIAGNOSIS — Z7982 Long term (current) use of aspirin: Secondary | ICD-10-CM | POA: Insufficient documentation

## 2022-07-10 DIAGNOSIS — Z79899 Other long term (current) drug therapy: Secondary | ICD-10-CM | POA: Diagnosis not present

## 2022-07-10 DIAGNOSIS — Z7984 Long term (current) use of oral hypoglycemic drugs: Secondary | ICD-10-CM | POA: Insufficient documentation

## 2022-07-10 DIAGNOSIS — R9439 Abnormal result of other cardiovascular function study: Secondary | ICD-10-CM | POA: Diagnosis present

## 2022-07-10 DIAGNOSIS — I2510 Atherosclerotic heart disease of native coronary artery without angina pectoris: Secondary | ICD-10-CM | POA: Insufficient documentation

## 2022-07-10 DIAGNOSIS — E119 Type 2 diabetes mellitus without complications: Secondary | ICD-10-CM | POA: Diagnosis not present

## 2022-07-10 DIAGNOSIS — I10 Essential (primary) hypertension: Secondary | ICD-10-CM | POA: Insufficient documentation

## 2022-07-10 DIAGNOSIS — E782 Mixed hyperlipidemia: Secondary | ICD-10-CM | POA: Insufficient documentation

## 2022-07-10 DIAGNOSIS — I2584 Coronary atherosclerosis due to calcified coronary lesion: Secondary | ICD-10-CM | POA: Diagnosis not present

## 2022-07-10 HISTORY — PX: LEFT HEART CATH AND CORONARY ANGIOGRAPHY: CATH118249

## 2022-07-10 LAB — GLUCOSE, CAPILLARY: Glucose-Capillary: 184 mg/dL — ABNORMAL HIGH (ref 70–99)

## 2022-07-10 SURGERY — LEFT HEART CATH AND CORONARY ANGIOGRAPHY
Anesthesia: LOCAL

## 2022-07-10 MED ORDER — SODIUM CHLORIDE 0.9 % WEIGHT BASED INFUSION
3.0000 mL/kg/h | INTRAVENOUS | Status: AC
  Administered 2022-07-10: 3 mL/kg/h via INTRAVENOUS

## 2022-07-10 MED ORDER — SODIUM CHLORIDE 0.9 % IV SOLN: 250.0000 mL | INTRAVENOUS | Status: DC | PRN

## 2022-07-10 MED ORDER — VERAPAMIL HCL 2.5 MG/ML IV SOLN
INTRA_ARTERIAL | Status: DC | PRN
  Administered 2022-07-10: 5 mL via INTRA_ARTERIAL

## 2022-07-10 MED ORDER — HEPARIN SODIUM (PORCINE) 1000 UNIT/ML IJ SOLN
INTRAMUSCULAR | Status: AC
  Filled 2022-07-10: qty 10

## 2022-07-10 MED ORDER — VERAPAMIL HCL 2.5 MG/ML IV SOLN
INTRAVENOUS | Status: AC
  Filled 2022-07-10: qty 2

## 2022-07-10 MED ORDER — MIDAZOLAM HCL 2 MG/2ML IJ SOLN
INTRAMUSCULAR | Status: AC
  Filled 2022-07-10: qty 2

## 2022-07-10 MED ORDER — ONDANSETRON HCL 4 MG/2ML IJ SOLN: 4.0000 mg | Freq: Four times a day (QID) | INTRAMUSCULAR | Status: DC | PRN

## 2022-07-10 MED ORDER — MORPHINE SULFATE (PF) 2 MG/ML IV SOLN: 2.0000 mg | INTRAVENOUS | Status: DC | PRN

## 2022-07-10 MED ORDER — HYDRALAZINE HCL 20 MG/ML IJ SOLN: 10.0000 mg | INTRAMUSCULAR | Status: DC | PRN

## 2022-07-10 MED ORDER — ASPIRIN 81 MG PO CHEW: 81.0000 mg | CHEWABLE_TABLET | ORAL | Status: DC

## 2022-07-10 MED ORDER — SODIUM CHLORIDE 0.9 % WEIGHT BASED INFUSION: 1.0000 mL/kg/h | INTRAVENOUS | Status: DC

## 2022-07-10 MED ORDER — MIDAZOLAM HCL 2 MG/2ML IJ SOLN
INTRAMUSCULAR | Status: DC | PRN
  Administered 2022-07-10: 1 mg via INTRAVENOUS

## 2022-07-10 MED ORDER — NITROGLYCERIN 1 MG/10 ML FOR IR/CATH LAB
INTRA_ARTERIAL | Status: AC
  Filled 2022-07-10: qty 10

## 2022-07-10 MED ORDER — ASPIRIN 81 MG PO CHEW: 81.0000 mg | CHEWABLE_TABLET | Freq: Every day | ORAL | Status: DC

## 2022-07-10 MED ORDER — IOHEXOL 350 MG/ML SOLN
INTRAVENOUS | Status: DC | PRN
  Administered 2022-07-10: 50 mL

## 2022-07-10 MED ORDER — HEPARIN SODIUM (PORCINE) 1000 UNIT/ML IJ SOLN
INTRAMUSCULAR | Status: DC | PRN
  Administered 2022-07-10: 5000 [IU] via INTRAVENOUS

## 2022-07-10 MED ORDER — SODIUM CHLORIDE 0.9% FLUSH: 3.0000 mL | INTRAVENOUS | Status: DC | PRN

## 2022-07-10 MED ORDER — HEPARIN (PORCINE) IN NACL 1000-0.9 UT/500ML-% IV SOLN
INTRAVENOUS | Status: AC
  Filled 2022-07-10: qty 500

## 2022-07-10 MED ORDER — FENTANYL CITRATE (PF) 100 MCG/2ML IJ SOLN
INTRAMUSCULAR | Status: DC | PRN
  Administered 2022-07-10: 25 ug via INTRAVENOUS

## 2022-07-10 MED ORDER — ACETAMINOPHEN 325 MG PO TABS: 650.0000 mg | ORAL_TABLET | ORAL | Status: DC | PRN

## 2022-07-10 MED ORDER — HEPARIN (PORCINE) IN NACL 1000-0.9 UT/500ML-% IV SOLN
INTRAVENOUS | Status: DC | PRN
  Administered 2022-07-10 (×2): 500 mL

## 2022-07-10 MED ORDER — SODIUM CHLORIDE 0.9% FLUSH: 3.0000 mL | Freq: Two times a day (BID) | INTRAVENOUS | Status: DC

## 2022-07-10 MED ORDER — LIDOCAINE HCL (PF) 1 % IJ SOLN
INTRAMUSCULAR | Status: DC | PRN
  Administered 2022-07-10: 2 mL

## 2022-07-10 MED ORDER — FENTANYL CITRATE (PF) 100 MCG/2ML IJ SOLN
INTRAMUSCULAR | Status: AC
  Filled 2022-07-10: qty 2

## 2022-07-10 MED ORDER — LIDOCAINE HCL (PF) 1 % IJ SOLN
INTRAMUSCULAR | Status: AC
  Filled 2022-07-10: qty 30

## 2022-07-10 MED ORDER — LABETALOL HCL 5 MG/ML IV SOLN: 10.0000 mg | INTRAVENOUS | Status: DC | PRN

## 2022-07-10 MED ORDER — SODIUM CHLORIDE 0.9 % IV SOLN: INTRAVENOUS | Status: DC

## 2022-07-10 SURGICAL SUPPLY — 12 items
CATH 5FR JL3.5 JR4 ANG PIG MP (CATHETERS) IMPLANT
DEVICE RAD COMP TR BAND LRG (VASCULAR PRODUCTS) IMPLANT
GLIDESHEATH SLEND A-KIT 6F 22G (SHEATH) IMPLANT
GUIDEWIRE INQWIRE 1.5J.035X260 (WIRE) IMPLANT
INQWIRE 1.5J .035X260CM (WIRE) ×1 IMPLANT
KIT HEART LEFT (KITS) ×1 IMPLANT
PACK CARDIAC CATHETERIZATION (CUSTOM PROCEDURE TRAY) ×1 IMPLANT
PROTECTION STATION PRESSURIZED (MISCELLANEOUS) ×1 IMPLANT
STATION PROTECTION PRESSURIZED (MISCELLANEOUS) IMPLANT
TRANSDUCER W/STOPCOCK (MISCELLANEOUS) ×1 IMPLANT
TUBING CIL FLEX 10 FLL-RA (TUBING) ×1 IMPLANT
WIRE HI TORQ VERSACORE-J 145CM (WIRE) IMPLANT

## 2022-07-10 NOTE — Progress Notes (Signed)
TR BAND REMOVAL  LOCATION:    right radial  DEFLATED PER PROTOCOL:    Yes.    TIME BAND OFF / DRESSING APPLIED:    1215   SITE UPON ARRIVAL:    Level 0  SITE AFTER BAND REMOVAL:    Level 0  CIRCULATION SENSATION AND MOVEMENT:    Within Normal Limits   Yes.    COMMENTS:   No bleeding noted  

## 2022-07-10 NOTE — Progress Notes (Signed)
Patient and wife was given discharge instructions. Both verbalized understanding. 

## 2022-07-10 NOTE — Interval H&P Note (Signed)
Cath Lab Visit (complete for each Cath Lab visit)  Clinical Evaluation Leading to the Procedure:   ACS: No.  Non-ACS:    Anginal Classification: No Symptoms  Anti-ischemic medical therapy: Minimal Therapy (1 class of medications)  Non-Invasive Test Results: Intermediate-risk stress test findings: cardiac mortality 1-3%/year  Prior CABG: No previous CABG      History and Physical Interval Note:  07/10/2022 9:45 AM  Luis Castillo  has presented today for surgery, with the diagnosis of abnormal nuclear.  The various methods of treatment have been discussed with the patient and family. After consideration of risks, benefits and other options for treatment, the patient has consented to  Procedure(s): LEFT HEART CATH AND CORONARY ANGIOGRAPHY (N/A) as a surgical intervention.  The patient's history has been reviewed, patient examined, no change in status, stable for surgery.  I have reviewed the patient's chart and labs.  Questions were answered to the patient's satisfaction.     Quay Burow

## 2022-07-10 NOTE — Discharge Instructions (Signed)

## 2022-07-11 ENCOUNTER — Encounter (HOSPITAL_COMMUNITY): Payer: Self-pay | Admitting: Cardiovascular Disease

## 2022-07-15 ENCOUNTER — Telehealth: Payer: Self-pay | Admitting: Cardiovascular Disease

## 2022-07-15 NOTE — Telephone Encounter (Signed)
Called Mr. Arnette. Dr. Chad Cordial has openings next week. We will change referral to Dr. Kipp Brood. Mr. Calver agrees with this plan.  [place] T. Audie Box, MD, [place]  [address], [place], [place] [address] [phone]  4:53 PM

## 2022-07-16 ENCOUNTER — Encounter: Payer: 59 | Admitting: Thoracic Surgery (Cardiothoracic Vascular Surgery)

## 2022-07-17 ENCOUNTER — Encounter: Payer: 59 | Admitting: Cardiothoracic Surgery

## 2022-07-17 ENCOUNTER — Encounter: Payer: 59 | Admitting: Thoracic Surgery (Cardiothoracic Vascular Surgery)

## 2022-07-17 ENCOUNTER — Institutional Professional Consult (permissible substitution): Payer: 59 | Admitting: Thoracic Surgery (Cardiothoracic Vascular Surgery)

## 2022-07-17 VITALS — BP 160/90 | HR 75 | Resp 20 | Ht 72.5 in | Wt 208.0 lb

## 2022-07-17 DIAGNOSIS — I2510 Atherosclerotic heart disease of native coronary artery without angina pectoris: Secondary | ICD-10-CM

## 2022-07-17 NOTE — H&P (View-Only) (Signed)
[address]       [place] [address]             [phone]        Dhanush C Cockrell [facility] Medical Record [id number] Date of Birth: 17-Jun-1960  Referring: Lorretta Harp, MD Primary Care: Wenda Low, MD Primary Cardiologist:None  Chief Complaint:    Chief Complaint  Patient presents with   Coronary Artery Disease    Surgical consult, Cardiac Cath 07/10/22/ ECHO 06/17/22    History of Present Illness:     62 year old male presents for surgical evaluation of three-vessel coronary artery disease.  He underwent a coronary calcium CT which showed an elevated level.  He subsequently underwent a stress test which was concerning for some ischemic changes.  In regards to his symptoms he continues to exercise regularly, and does about 40 minutes of cardio 5-6 times per week without symptoms.  The main reason why he stopped the calcium score was because his sister recently died suddenly at home.  There is no strong family history outside of this in regards to coronary disease.   Past Medical and Surgical History: Previous Chest Surgery: No Previous Chest Radiation: No Diabetes Mellitus: Yes.  HbA1C: 7.9 Creatinine: 0.86  Past Medical History:  Diagnosis Date   Complication of anesthesia    Heart rate dropped with Knee surgery   Coronary artery disease    Diabetes mellitus without complication ([place])    Hypertension     Past Surgical History:  Procedure Laterality Date   BACK SURGERY  2006   COLONOSCOPY WITH PROPOFOL N/A 11/10/2016   Procedure: COLONOSCOPY WITH PROPOFOL;  Surgeon: Garlan Fair, MD;  Location: [facility];  Service: Endoscopy;  Laterality: N/A;   KNEE CARTILAGE SURGERY  2015   Both knees done.   LEFT HEART CATH AND CORONARY ANGIOGRAPHY N/A 07/10/2022   Procedure: LEFT HEART CATH AND CORONARY ANGIOGRAPHY;  Surgeon: Lorretta Harp, MD;  Location: [facility];  Service: Cardiovascular;  Laterality: N/A;    Social  History: Support: Lives with his wife.  His son is an EMT at [employer].  Social History   Tobacco Use  Smoking Status Never  Smokeless Tobacco Never    Social History   Substance and Sexual Activity  Alcohol Use Yes   Alcohol/week: 2.0 standard drinks of alcohol   Types: 2 Cans of beer per week   Comment: Weekly     No Known Allergies  Medications: Asprin: Yes Statin: Yes Beta Blocker: No Ace Inhibitor: Yes Anti-Coagulation: No  Current Outpatient Medications  Medication Sig Dispense Refill   amLODipine-benazepril (LOTREL) 5-20 MG per capsule Take 1 capsule by mouth daily.      aspirin EC 81 MG tablet Take 81 mg by mouth daily.     atorvastatin (LIPITOR) 40 MG tablet Take 40 mg by mouth daily.     glimepiride (AMARYL) 2 MG tablet Take 2 mg by mouth every morning.     hydrochlorothiazide (HYDRODIURIL) 25 MG tablet Take 25 mg by mouth daily.     JARDIANCE 10 MG TABS tablet   0   metFORMIN (GLUCOPHAGE) 500 MG tablet Take 1,500 mg by mouth. Takes 2 pills in the am, 1 pill in the pm.     VIAGRA 100 MG tablet Take 100 mg by mouth daily as needed for erectile dysfunction.      meloxicam (MOBIC) 15 MG tablet Take 1 tablet daily with food for 10 days.  Then take as needed. 40 tablet 1   No current facility-administered medications for this visit.    (Not in a hospital admission)   Family History  Problem Relation Age of Onset   Cancer Father    Heart attack Sister      Review of Systems:   Review of Systems  Constitutional:  Negative for malaise/fatigue and weight loss.  Respiratory:  Negative for cough and shortness of breath.   Cardiovascular:  Negative for chest pain and palpitations.  Musculoskeletal:  Positive for joint pain and myalgias.  Neurological: Negative.       Physical Exam: BP (!) 160/90   Pulse 75   Resp 20   Ht 6' 0.5" (1.842 m)   Wt 208 lb (94.3 kg)   SpO2 95% Comment: RA  BMI 27.82 kg/m  Physical Exam Constitutional:      General:  He is not in acute distress.    Appearance: Normal appearance. He is normal weight. He is not ill-appearing.  Eyes:     Extraocular Movements: Extraocular movements intact.  Cardiovascular:     Rate and Rhythm: Normal rate.  Pulmonary:     Effort: Pulmonary effort is normal. No respiratory distress.  Abdominal:     General: There is no distension.  Musculoskeletal:        General: Normal range of motion.     Cervical back: Normal range of motion.  Skin:    General: Skin is warm and dry.  Neurological:     General: No focal deficit present.     Mental Status: He is alert and oriented to person, place, and time.       Diagnostic Studies & Laboratory data:    Left Heart Catherization:  Intervention  Echo: IMPRESSIONS     1. Left ventricular ejection fraction, by estimation, is 55 to 60%. The  left ventricle has normal function. The left ventricle has no regional  wall motion abnormalities. There is mild concentric left ventricular  hypertrophy. Left ventricular diastolic  parameters are consistent with Grade I diastolic dysfunction (impaired  relaxation).   2. Right ventricular systolic function is normal. The right ventricular  size is normal. Tricuspid regurgitation signal is inadequate for assessing  PA pressure.   3. The mitral valve is normal in structure. Trivial mitral valve  regurgitation. No evidence of mitral stenosis.   4. The aortic valve is tricuspid. Aortic valve regurgitation is not  visualized. No aortic stenosis is present.   5. Aortic dilatation noted. There is mild dilatation of the aortic root,  measuring 41 mm. There is mild dilatation of the ascending aorta,  measuring 41 mm.   6. The inferior vena cava is normal in size with greater than 50%  respiratory variability, suggesting right atrial pressure of 3 mmHg   EKG: Sinus I have independently reviewed the above radiologic studies and discussed with the patient   Recent Lab Findings: Lab  Results  Component Value Date   WBC 8.4 07/09/2022   HGB 17.0 07/09/2022   HCT 49.2 07/09/2022   PLT 186 07/09/2022   GLUCOSE 192 (H) 07/09/2022   NA 140 07/09/2022   K 4.2 07/09/2022   CL 101 07/09/2022   CREATININE 0.86 07/09/2022   BUN 17 07/09/2022   CO2 28 07/09/2022      Assessment / Plan:   62 year old male with three-vessel coronary artery disease.  His echocardiogram demonstrates preserved biventricular function and no significant valvular disease.  He does have mild dilation of  his ascending aorta 4.1 cm on echo.  Personally reviewed his left heart cath, and he has diffusely diseased vessels.  There is a good target on the LAD, diagonal, obtuse marginal, PLV, and possibly the PDA.  We discussed the risk and benefits of surgical revascularization with the use of the left radial artery.  He is agreeable to proceed.  He is hypertensive today in clinic, thus I am instructed him not to stop his Lotrel.  He is tentatively scheduled for the 21st of this month.     I  spent 40 minutes counseling the patient face to face.   Lajuana Matte 07/17/2022 6:04 PM

## 2022-07-17 NOTE — Progress Notes (Signed)
[address]       [place] [address]             [phone]        Ladavion C Figler [facility] Medical Record [id number] Date of Birth: 1960/09/11  Referring: Lorretta Harp, MD Primary Care: Wenda Low, MD Primary Cardiologist:None  Chief Complaint:    Chief Complaint  Patient presents with   Coronary Artery Disease    Surgical consult, Cardiac Cath 07/10/22/ ECHO 06/17/22    History of Present Illness:     62 year old male presents for surgical evaluation of three-vessel coronary artery disease.  He underwent a coronary calcium CT which showed an elevated level.  He subsequently underwent a stress test which was concerning for some ischemic changes.  In regards to his symptoms he continues to exercise regularly, and does about 40 minutes of cardio 5-6 times per week without symptoms.  The main reason why he stopped the calcium score was because his sister recently died suddenly at home.  There is no strong family history outside of this in regards to coronary disease.   Past Medical and Surgical History: Previous Chest Surgery: No Previous Chest Radiation: No Diabetes Mellitus: Yes.  HbA1C: 7.9 Creatinine: 0.86  Past Medical History:  Diagnosis Date   Complication of anesthesia    Heart rate dropped with Knee surgery   Coronary artery disease    Diabetes mellitus without complication ([place])    Hypertension     Past Surgical History:  Procedure Laterality Date   BACK SURGERY  2006   COLONOSCOPY WITH PROPOFOL N/A 11/10/2016   Procedure: COLONOSCOPY WITH PROPOFOL;  Surgeon: Garlan Fair, MD;  Location: [facility];  Service: Endoscopy;  Laterality: N/A;   KNEE CARTILAGE SURGERY  2015   Both knees done.   LEFT HEART CATH AND CORONARY ANGIOGRAPHY N/A 07/10/2022   Procedure: LEFT HEART CATH AND CORONARY ANGIOGRAPHY;  Surgeon: Lorretta Harp, MD;  Location: [facility];  Service: Cardiovascular;  Laterality: N/A;    Social  History: Support: Lives with his wife.  His son is an EMT at [employer].  Social History   Tobacco Use  Smoking Status Never  Smokeless Tobacco Never    Social History   Substance and Sexual Activity  Alcohol Use Yes   Alcohol/week: 2.0 standard drinks of alcohol   Types: 2 Cans of beer per week   Comment: Weekly     No Known Allergies  Medications: Asprin: Yes Statin: Yes Beta Blocker: No Ace Inhibitor: Yes Anti-Coagulation: No  Current Outpatient Medications  Medication Sig Dispense Refill   amLODipine-benazepril (LOTREL) 5-20 MG per capsule Take 1 capsule by mouth daily.      aspirin EC 81 MG tablet Take 81 mg by mouth daily.     atorvastatin (LIPITOR) 40 MG tablet Take 40 mg by mouth daily.     glimepiride (AMARYL) 2 MG tablet Take 2 mg by mouth every morning.     hydrochlorothiazide (HYDRODIURIL) 25 MG tablet Take 25 mg by mouth daily.     JARDIANCE 10 MG TABS tablet   0   metFORMIN (GLUCOPHAGE) 500 MG tablet Take 1,500 mg by mouth. Takes 2 pills in the am, 1 pill in the pm.     VIAGRA 100 MG tablet Take 100 mg by mouth daily as needed for erectile dysfunction.      meloxicam (MOBIC) 15 MG tablet Take 1 tablet daily with food for 10 days.  Then take as needed. 40 tablet 1   No current facility-administered medications for this visit.    (Not in a hospital admission)   Family History  Problem Relation Age of Onset   Cancer Father    Heart attack Sister      Review of Systems:   Review of Systems  Constitutional:  Negative for malaise/fatigue and weight loss.  Respiratory:  Negative for cough and shortness of breath.   Cardiovascular:  Negative for chest pain and palpitations.  Musculoskeletal:  Positive for joint pain and myalgias.  Neurological: Negative.       Physical Exam: BP (!) 160/90   Pulse 75   Resp 20   Ht 6' 0.5" (1.842 m)   Wt 208 lb (94.3 kg)   SpO2 95% Comment: RA  BMI 27.82 kg/m  Physical Exam Constitutional:      General:  He is not in acute distress.    Appearance: Normal appearance. He is normal weight. He is not ill-appearing.  Eyes:     Extraocular Movements: Extraocular movements intact.  Cardiovascular:     Rate and Rhythm: Normal rate.  Pulmonary:     Effort: Pulmonary effort is normal. No respiratory distress.  Abdominal:     General: There is no distension.  Musculoskeletal:        General: Normal range of motion.     Cervical back: Normal range of motion.  Skin:    General: Skin is warm and dry.  Neurological:     General: No focal deficit present.     Mental Status: He is alert and oriented to person, place, and time.       Diagnostic Studies & Laboratory data:    Left Heart Catherization:  Intervention  Echo: IMPRESSIONS     1. Left ventricular ejection fraction, by estimation, is 55 to 60%. The  left ventricle has normal function. The left ventricle has no regional  wall motion abnormalities. There is mild concentric left ventricular  hypertrophy. Left ventricular diastolic  parameters are consistent with Grade I diastolic dysfunction (impaired  relaxation).   2. Right ventricular systolic function is normal. The right ventricular  size is normal. Tricuspid regurgitation signal is inadequate for assessing  PA pressure.   3. The mitral valve is normal in structure. Trivial mitral valve  regurgitation. No evidence of mitral stenosis.   4. The aortic valve is tricuspid. Aortic valve regurgitation is not  visualized. No aortic stenosis is present.   5. Aortic dilatation noted. There is mild dilatation of the aortic root,  measuring 41 mm. There is mild dilatation of the ascending aorta,  measuring 41 mm.   6. The inferior vena cava is normal in size with greater than 50%  respiratory variability, suggesting right atrial pressure of 3 mmHg   EKG: Sinus I have independently reviewed the above radiologic studies and discussed with the patient   Recent Lab Findings: Lab  Results  Component Value Date   WBC 8.4 07/09/2022   HGB 17.0 07/09/2022   HCT 49.2 07/09/2022   PLT 186 07/09/2022   GLUCOSE 192 (H) 07/09/2022   NA 140 07/09/2022   K 4.2 07/09/2022   CL 101 07/09/2022   CREATININE 0.86 07/09/2022   BUN 17 07/09/2022   CO2 28 07/09/2022      Assessment / Plan:   62 year old male with three-vessel coronary artery disease.  His echocardiogram demonstrates preserved biventricular function and no significant valvular disease.  He does have mild dilation of  his ascending aorta 4.1 cm on echo.  Personally reviewed his left heart cath, and he has diffusely diseased vessels.  There is a good target on the LAD, diagonal, obtuse marginal, PLV, and possibly the PDA.  We discussed the risk and benefits of surgical revascularization with the use of the left radial artery.  He is agreeable to proceed.  He is hypertensive today in clinic, thus I am instructed him not to stop his Lotrel.  He is tentatively scheduled for the 21st of this month.     I  spent 40 minutes counseling the patient face to face.   Lajuana Matte 07/17/2022 6:04 PM

## 2022-07-18 ENCOUNTER — Other Ambulatory Visit: Payer: Self-pay | Admitting: *Deleted

## 2022-07-18 DIAGNOSIS — I2510 Atherosclerotic heart disease of native coronary artery without angina pectoris: Secondary | ICD-10-CM

## 2022-07-21 NOTE — Progress Notes (Signed)
Surgical Instructions    Your procedure is scheduled on Wednesday February 21.  Report to [facility] Main Entrance "A" at 6:30 A.M., then check in with the Admitting office.  Call this number if you have problems the morning of surgery:  [phone]   If you have any questions prior to your surgery date call [phone]: Open Monday-Friday 8am-4pm If you experience any cold or flu symptoms such as cough, fever, chills, shortness of breath, etc. between now and your scheduled surgery, please notify us at the above number     Remember:  Do not eat or drink anything after midnight the night before your surgery   Take these medicines the morning of surgery with A SIP OF WATER:  atorvastatin (LIPITOR)   As of today, STOP taking any Aspirin (unless otherwise instructed by your surgeon) Aleve, Naproxen, Ibuprofen, meloxicam (MOBIC), Motrin, Advil, Goody's, BC's, all herbal medications, fish oil, and all vitamins.  WHAT DO I DO ABOUT MY DIABETES MEDICATION?  Do not take oral diabetes medicines (pills) the morning of surgery. DO NOT take glimepiride (AMARYL) and  metFORMIN (GLUCOPHAGE) the morning of surgery.  HOLD empagliflozin (JARDIANCE) 72 HOURS PRIOR TO SURGERY.   HOW TO MANAGE YOUR DIABETES BEFORE AND AFTER SURGERY  Why is it important to control my blood sugar before and after surgery? Improving blood sugar levels before and after surgery helps healing and can limit problems. A way of improving blood sugar control is eating a healthy diet by:  Eating less sugar and carbohydrates  Increasing activity/exercise  Talking with your doctor about reaching your blood sugar goals High blood sugars (greater than 180 mg/dL) can raise your risk of infections and slow your recovery, so you will need to focus on controlling your diabetes during the weeks before surgery. Make sure that the doctor who takes care of your diabetes knows about your planned surgery including the date and  location.  How do I manage my blood sugar before surgery? Check your blood sugar at least 4 times a day, starting 2 days before surgery, to make sure that the level is not too high or low.  Check your blood sugar the morning of your surgery when you wake up and every 2 hours until you get to the Short Stay unit.  If your blood sugar is less than 70 mg/dL, you will need to treat for low blood sugar: Do not take insulin. Treat a low blood sugar (less than 70 mg/dL) with  cup of clear juice (cranberry or apple), 4 glucose tablets, OR glucose gel. Recheck blood sugar in 15 minutes after treatment (to make sure it is greater than 70 mg/dL). If your blood sugar is not greater than 70 mg/dL on recheck, call [phone] for further instructions. Report your blood sugar to the short stay nurse when you get to Short Stay.  If you are admitted to the hospital after surgery: Your blood sugar will be checked by the staff and you will probably be given insulin after surgery (instead of oral diabetes medicines) to make sure you have good blood sugar levels. The goal for blood sugar control after surgery is 80-180 mg/dL.            Do not wear jewelry or makeup. Do not wear lotions, powders, perfumes/cologne or deodorant. Do not shave 48 hours prior to surgery.  Men may shave face and neck. Do not bring valuables to the hospital. Do not wear nail polish, gel polish, artificial nails, or any other  type of covering on natural nails (fingers and toes) If you have artificial nails or gel coating that need to be removed by a nail salon, please have this removed prior to surgery. Artificial nails or gel coating may interfere with anesthesia's ability to adequately monitor your vital signs.  [facility] is not responsible for any belongings or valuables.    Do NOT Smoke (Tobacco/Vaping)  24 hours prior to your procedure  If you use a CPAP at night, you may bring your mask for your overnight stay.    Contacts, glasses, hearing aids, dentures or partials may not be worn into surgery, please bring cases for these belongings   For patients admitted to the hospital, discharge time will be determined by your treatment team.   Patients discharged the day of surgery will not be allowed to drive home, and someone needs to stay with them for 24 hours.   SURGICAL WAITING ROOM VISITATION Patients having surgery or a procedure may have no more than 2 support people in the waiting area - these visitors may rotate.   Children under the age of 13 must have an adult with them who is not the patient. If the patient needs to stay at the hospital during part of their recovery, the visitor guidelines for inpatient rooms apply. Pre-op nurse will coordinate an appropriate time for 1 support person to accompany patient in pre-op.  This support person may not rotate.   Please refer to [email or web address] for the visitor guidelines for Inpatients (after your surgery is over and you are in a regular room).    Special instructions:    Oral Hygiene is also important to reduce your risk of infection.  Remember - BRUSH YOUR TEETH THE MORNING OF SURGERY WITH YOUR REGULAR TOOTHPASTE   [facility]- Preparing For Surgery  Before surgery, you can play an important role. Because skin is not sterile, your skin needs to be as free of germs as possible. You can reduce the number of germs on your skin by washing with CHG (chlorahexidine gluconate) Soap before surgery.  CHG is an antiseptic cleaner which kills germs and bonds with the skin to continue killing germs even after washing.     Please do not use if you have an allergy to CHG or antibacterial soaps. If your skin becomes reddened/irritated stop using the CHG.  Do not shave (including legs and underarms) for at least 48 hours prior to first CHG shower. It is OK to shave your face.  Please follow these instructions  carefully.     Shower the NIGHT BEFORE SURGERY and the MORNING OF SURGERY with CHG Soap.   If you chose to wash your hair, wash your hair first as usual with your normal shampoo. After you shampoo, rinse your hair and body thoroughly to remove the shampoo.  Then [employer] and genitals (private parts) with your normal soap and rinse thoroughly to remove soap.  After that Use CHG Soap as you would any other liquid soap. You can apply CHG directly to the skin and wash gently with a scrungie or a clean washcloth.   Apply the CHG Soap to your body ONLY FROM THE NECK DOWN.  Do not use on open wounds or open sores. Avoid contact with your eyes, ears, mouth and genitals (private parts). Wash Face and genitals (private parts)  with your normal soap.   Wash thoroughly, paying special attention to the area where your surgery will be performed.  Thoroughly rinse your body  with warm water from the neck down.  DO NOT shower/wash with your normal soap after using and rinsing off the CHG Soap.  Pat yourself dry with a CLEAN TOWEL.  Wear CLEAN PAJAMAS to bed the night before surgery  Place CLEAN SHEETS on your bed the night before your surgery  DO NOT SLEEP WITH PETS.   Day of Surgery:  Take a shower with CHG soap. Wear Clean/Comfortable clothing the morning of surgery Do not apply any deodorants/lotions.   Remember to brush your teeth WITH YOUR REGULAR TOOTHPASTE.    If you received a COVID test during your pre-op visit, it is requested that you wear a mask when out in public, stay away from anyone that may not be feeling well, and notify your surgeon if you develop symptoms. If you have been in contact with anyone that has tested positive in the last 10 days, please notify your surgeon.    Please read over the following fact sheets that you were given.

## 2022-07-22 ENCOUNTER — Encounter (HOSPITAL_COMMUNITY): Payer: Self-pay

## 2022-07-22 ENCOUNTER — Ambulatory Visit (HOSPITAL_BASED_OUTPATIENT_CLINIC_OR_DEPARTMENT_OTHER)
Admission: RE | Admit: 2022-07-22 | Discharge: 2022-07-22 | Disposition: A | Discharge status: Home / Self Care | Payer: 59 | Source: Ambulatory Visit | Attending: Thoracic Surgery (Cardiothoracic Vascular Surgery) | Admitting: Thoracic Surgery (Cardiothoracic Vascular Surgery)

## 2022-07-22 ENCOUNTER — Ambulatory Visit (HOSPITAL_COMMUNITY)
Admission: RE | Admit: 2022-07-22 | Discharge: 2022-07-22 | Disposition: A | Discharge status: Home / Self Care | Payer: 59 | Source: Ambulatory Visit | Attending: Thoracic Surgery (Cardiothoracic Vascular Surgery) | Admitting: Thoracic Surgery (Cardiothoracic Vascular Surgery)

## 2022-07-22 ENCOUNTER — Other Ambulatory Visit: Payer: Self-pay

## 2022-07-22 ENCOUNTER — Encounter (HOSPITAL_COMMUNITY)
Admission: RE | Admit: 2022-07-22 | Discharge: 2022-07-22 | Disposition: A | Discharge status: Home / Self Care | Payer: 59 | Source: Ambulatory Visit | Attending: Thoracic Surgery (Cardiothoracic Vascular Surgery) | Admitting: Thoracic Surgery (Cardiothoracic Vascular Surgery)

## 2022-07-22 VITALS — BP 138/92 | HR 66 | Temp 97.7°F | Resp 18 | Ht 72.5 in | Wt 213.0 lb

## 2022-07-22 DIAGNOSIS — I2510 Atherosclerotic heart disease of native coronary artery without angina pectoris: Secondary | ICD-10-CM

## 2022-07-22 DIAGNOSIS — Z01818 Encounter for other preprocedural examination: Secondary | ICD-10-CM | POA: Insufficient documentation

## 2022-07-22 DIAGNOSIS — I10 Essential (primary) hypertension: Secondary | ICD-10-CM | POA: Insufficient documentation

## 2022-07-22 DIAGNOSIS — E119 Type 2 diabetes mellitus without complications: Secondary | ICD-10-CM | POA: Insufficient documentation

## 2022-07-22 DIAGNOSIS — Z8249 Family history of ischemic heart disease and other diseases of the circulatory system: Secondary | ICD-10-CM

## 2022-07-22 DIAGNOSIS — Z1152 Encounter for screening for COVID-19: Secondary | ICD-10-CM | POA: Insufficient documentation

## 2022-07-22 HISTORY — DX: Personal history of other medical treatment: Z92.89

## 2022-07-22 HISTORY — DX: Unspecified osteoarthritis, unspecified site: M19.90

## 2022-07-22 HISTORY — DX: Headache, unspecified: R51.9

## 2022-07-22 LAB — CBC
HCT: 47.6 % (ref 39.0–52.0)
Hemoglobin: 16.9 g/dL (ref 13.0–17.0)
MCH: 31.1 pg (ref 26.0–34.0)
MCHC: 35.5 g/dL (ref 30.0–36.0)
MCV: 87.5 fL (ref 80.0–100.0)
Platelets: 189 10*3/uL (ref 150–400)
RBC: 5.44 MIL/uL (ref 4.22–5.81)
RDW: 12.3 % (ref 11.5–15.5)
WBC: 8.2 10*3/uL (ref 4.0–10.5)
nRBC: 0 % (ref 0.0–0.2)

## 2022-07-22 LAB — SURGICAL PCR SCREEN
MRSA, PCR: NEGATIVE
Staphylococcus aureus: POSITIVE — AB

## 2022-07-22 LAB — COMPREHENSIVE METABOLIC PANEL
ALT: 46 U/L — ABNORMAL HIGH (ref 0–44)
AST: 30 U/L (ref 15–41)
Albumin: 4.2 g/dL (ref 3.5–5.0)
Alkaline Phosphatase: 80 U/L (ref 38–126)
Anion gap: 11 (ref 5–15)
BUN: 16 mg/dL (ref 8–23)
CO2: 24 mmol/L (ref 22–32)
Calcium: 9.1 mg/dL (ref 8.9–10.3)
Chloride: 103 mmol/L (ref 98–111)
Creatinine, Ser: 0.88 mg/dL (ref 0.61–1.24)
GFR, Estimated: >60 mL/min (ref >60)
Glucose, Bld: 261 mg/dL — ABNORMAL HIGH (ref 70–99)
Potassium: 4.1 mmol/L (ref 3.5–5.1)
Sodium: 138 mmol/L (ref 135–145)
Total Bilirubin: 1.1 mg/dL (ref 0.3–1.2)
Total Protein: 7.3 g/dL (ref 6.5–8.1)

## 2022-07-22 LAB — BLOOD GAS, ARTERIAL
Acid-Base Excess: 2.4 mmol/L — ABNORMAL HIGH (ref 0.0–2.0)
Bicarbonate: 26.4 mmol/L (ref 20.0–28.0)
Drawn by: 58793
O2 Saturation: 97.7 %
Patient temperature: 37
pCO2 arterial: 38 mmHg (ref 32–48)
pH, Arterial: 7.45 (ref 7.35–7.45)
pO2, Arterial: 94 mmHg (ref 83–108)

## 2022-07-22 LAB — PROTIME-INR
INR: 1.1 (ref 0.8–1.2)
Prothrombin Time: 13.6 seconds (ref 11.4–15.2)

## 2022-07-22 LAB — SARS CORONAVIRUS 2 BY RT PCR: SARS Coronavirus 2 by RT PCR: NEGATIVE

## 2022-07-22 LAB — URINALYSIS, ROUTINE W REFLEX MICROSCOPIC
Bacteria, UA: NONE SEEN
Bilirubin Urine: NEGATIVE
Glucose, UA: >=500 mg/dL — AB
Hgb urine dipstick: NEGATIVE
Ketones, ur: NEGATIVE mg/dL
Leukocytes,Ua: NEGATIVE
Nitrite: NEGATIVE
Protein, ur: NEGATIVE mg/dL
Specific Gravity, Urine: 1.031 — ABNORMAL HIGH (ref 1.005–1.030)
pH: 5 (ref 5.0–8.0)

## 2022-07-22 LAB — GLUCOSE, CAPILLARY: Glucose-Capillary: 295 mg/dL — ABNORMAL HIGH (ref 70–99)

## 2022-07-22 LAB — VAS US DOPPLER PRE CABG
Left ABI: 1.25
Right ABI: 1.24

## 2022-07-22 LAB — HEMOGLOBIN A1C
Hgb A1c MFr Bld: 7.6 % — ABNORMAL HIGH (ref 4.8–5.6)
Mean Plasma Glucose: 171.42 mg/dL

## 2022-07-22 LAB — APTT: aPTT: 24 seconds (ref 24–36)

## 2022-07-22 MED ORDER — MILRINONE LACTATE IN DEXTROSE 20-5 MG/100ML-% IV SOLN
0.3000 ug/kg/min | INTRAVENOUS | Status: DC
  Filled 2022-07-22: qty 100

## 2022-07-22 MED ORDER — HEPARIN 30,000 UNITS/1000 ML (OHS) CELLSAVER SOLUTION
Status: DC
  Filled 2022-07-22: qty 1000

## 2022-07-22 MED ORDER — NITROGLYCERIN IN D5W 200-5 MCG/ML-% IV SOLN
2.0000 ug/min | INTRAVENOUS | Status: AC
  Administered 2022-07-23: 10 ug/min via INTRAVENOUS
  Filled 2022-07-22: qty 250

## 2022-07-22 MED ORDER — DEXMEDETOMIDINE HCL IN NACL 400 MCG/100ML IV SOLN
0.1000 ug/kg/h | INTRAVENOUS | Status: AC
  Administered 2022-07-23: .7 ug/kg/h via INTRAVENOUS
  Filled 2022-07-22: qty 100

## 2022-07-22 MED ORDER — CEFAZOLIN SODIUM-DEXTROSE 2-4 GM/100ML-% IV SOLN
2.0000 g | INTRAVENOUS | Status: AC
  Administered 2022-07-23: 2 g via INTRAVENOUS
  Filled 2022-07-22: qty 100

## 2022-07-22 MED ORDER — VANCOMYCIN HCL 1500 MG/300ML IV SOLN
1500.0000 mg | INTRAVENOUS | Status: AC
  Administered 2022-07-23: 1500 mg via INTRAVENOUS
  Filled 2022-07-22: qty 300

## 2022-07-22 MED ORDER — MANNITOL 20 % IV SOLN
INTRAVENOUS | Status: DC
  Filled 2022-07-22: qty 13

## 2022-07-22 MED ORDER — TRANEXAMIC ACID (OHS) PUMP PRIME SOLUTION
2.0000 mg/kg | INTRAVENOUS | Status: DC
  Filled 2022-07-22: qty 1.93

## 2022-07-22 MED ORDER — INSULIN REGULAR(HUMAN) IN NACL 100-0.9 UT/100ML-% IV SOLN
INTRAVENOUS | Status: AC
  Administered 2022-07-23: 3.4 [IU]/h via INTRAVENOUS
  Filled 2022-07-22: qty 100

## 2022-07-22 MED ORDER — NOREPINEPHRINE 4 MG/250ML-% IV SOLN
0.0000 ug/min | INTRAVENOUS | Status: DC
  Filled 2022-07-22: qty 250

## 2022-07-22 MED ORDER — POTASSIUM CHLORIDE 2 MEQ/ML IV SOLN
80.0000 meq | INTRAVENOUS | Status: DC
  Filled 2022-07-22: qty 40

## 2022-07-22 MED ORDER — TRANEXAMIC ACID (OHS) BOLUS VIA INFUSION
15.0000 mg/kg | INTRAVENOUS | Status: AC
  Administered 2022-07-23: 1449 mg via INTRAVENOUS
  Filled 2022-07-22: qty 1449

## 2022-07-22 MED ORDER — EPINEPHRINE HCL 5 MG/250ML IV SOLN IN NS
0.0000 ug/min | INTRAVENOUS | Status: DC
  Filled 2022-07-22: qty 250

## 2022-07-22 MED ORDER — PHENYLEPHRINE HCL-NACL 20-0.9 MG/250ML-% IV SOLN
30.0000 ug/min | INTRAVENOUS | Status: AC
  Administered 2022-07-23: 25 ug/min via INTRAVENOUS
  Filled 2022-07-22: qty 250

## 2022-07-22 MED ORDER — PLASMA-LYTE A IV SOLN
INTRAVENOUS | Status: DC
  Filled 2022-07-22: qty 2.5

## 2022-07-22 MED ORDER — TRANEXAMIC ACID 1000 MG/10ML IV SOLN
1.5000 mg/kg/h | INTRAVENOUS | Status: AC
  Administered 2022-07-23: 1.5 mg/kg/h via INTRAVENOUS
  Filled 2022-07-22: qty 25

## 2022-07-22 NOTE — Progress Notes (Addendum)
PCP - Dr. Wenda Low  Cardiologist - Dr, Audie Box   EP-no  Endocrine-no  Pulm-no  Chest x-ray - 07/22/22  EKG - 07/09/22  Stress Test - 07/02/22  ECHO - 07/18/22  Cardiac Cath - 07/10/22  AICD-no PM-no LOOP-no  Nerve Stimulator-no  Dialysis-no  Sleep Study - no CPAP - no  LABS-CBC, CMP, PT, PTT, A1C, ABG, UA  ASA- last dose today  ERAS-no  Hemo A1C 07/22/22- 7.6 Fasting Blood Sugar - 120 Checks Blood Sugar ___2__ times a day  Anesthesia-  Pt denies having chest pain, sob, or fever at this time. All instructions explained to the pt, with a verbal understanding of the material. Pt agrees to go over the instructions while at home for a better understanding. Pt also instructed to self quarantine after being tested for COVID-19. The opportunity to ask questions was provided.

## 2022-07-22 NOTE — Progress Notes (Signed)
Surgical Instructions    Your procedure is scheduled on Wednesday February 21.  Report to [facility] Main Entrance "A" at 6:30 A.M., then check in with the Admitting office.  Call this number if you have problems the morning of surgery:  [phone]- this is the pre- surgery desk.  If you experience any cold or flu symptoms such as cough, fever, chills, shortness of breath, etc. between now and your scheduled surgery, please notify us at the above number     Remember:  Do not eat or drink anything after midnight the night before your surgery   Take these medicines the morning of surgery with A SIP OF WATER:  atorvastatin (LIPITOR)   As of today, STOP taking any Aspirin (unless otherwise instructed by your surgeon) Aleve, Naproxen, Ibuprofen, meloxicam (MOBIC), Motrin, Advil, Goody's, BC's, all herbal medications, fish oil, and all vitamins.  WHAT DO I DO ABOUT MY DIABETES MEDICATION?  Do not take oral diabetes medicines (pills) the morning of surgery. DO NOT take glimepiride (AMARYL) and  metFORMIN (GLUCOPHAGE) the morning of surgery.  HOLD empagliflozin (JARDIANCE) 72 HOURS PRIOR TO SURGERY.   HOW TO MANAGE YOUR DIABETES BEFORE AND AFTER SURGERY  Why is it important to control my blood sugar before and after surgery? Improving blood sugar levels before and after surgery helps healing and can limit problems. A way of improving blood sugar control is eating a healthy diet by:  Eating less sugar and carbohydrates  Increasing activity/exercise  Talking with your doctor about reaching your blood sugar goals High blood sugars (greater than 180 mg/dL) can raise your risk of infections and slow your recovery, so you will need to focus on controlling your diabetes during the weeks before surgery. Make sure that the doctor who takes care of your diabetes knows about your planned surgery including the date and location.  How do I manage my blood sugar before surgery? Check your blood  sugar at least 4 times a day, starting 2 days before surgery, to make sure that the level is not too high or low.  Check your blood sugar the morning of your surgery when you wake up and every 2 hours until you get to the Short Stay unit.  If your blood sugar is less than 70 mg/dL, you will need to treat for low blood sugar: Do not take insulin. Treat a low blood sugar (less than 70 mg/dL) with  cup of clear juice (cranberry or apple), 4 glucose tablets, OR glucose gel. Recheck blood sugar in 15 minutes after treatment (to make sure it is greater than 70 mg/dL). If your blood sugar is not greater than 70 mg/dL on recheck, call [phone] for further instructions. Report your blood sugar to the short stay nurse when you get to Short Stay.  If you are admitted to the hospital after surgery: Your blood sugar will be checked by the staff and you will probably be given insulin after surgery (instead of oral diabetes medicines) to make sure you have good blood sugar levels. The goal for blood sugar control after surgery is 80-180 mg/dL.            Do not wear jewelry or makeup. Do not wear lotions, powders, perfumes/cologne or deodorant. Do not shave 48 hours prior to surgery.  Men may shave face and neck. Do not bring valuables to the hospital. Do not wear nail polish, gel polish, artificial nails, or any other type of covering on natural nails (fingers and toes) If  you have artificial nails or gel coating that need to be removed by a nail salon, please have this removed prior to surgery. Artificial nails or gel coating may interfere with anesthesia's ability to adequately monitor your vital signs.  [facility] is not responsible for any belongings or valuables.    Do NOT Smoke (Tobacco/Vaping)  24 hours prior to your procedure  If you use a CPAP at night, you may bring your mask for your overnight stay.   Contacts, glasses, hearing aids, dentures or partials may not be worn into surgery,  please bring cases for these belongings   For patients admitted to the hospital, discharge time will be determined by your treatment team.   Patients discharged the day of surgery will not be allowed to drive home, and someone needs to stay with them for 24 hours.   SURGICAL WAITING ROOM VISITATION Patients having surgery or a procedure may have no more than 2 support people in the waiting area - these visitors may rotate.   Children under the age of 48 must have an adult with them who is not the patient. If the patient needs to stay at the hospital during part of their recovery, the visitor guidelines for inpatient rooms apply. Pre-op nurse will coordinate an appropriate time for 1 support person to accompany patient in pre-op.  This support person may not rotate.   Please refer to [email or web address] for the visitor guidelines for Inpatients (after your surgery is over and you are in a regular room).    Special instructions:    Oral Hygiene is also important to reduce your risk of infection.  Remember - BRUSH YOUR TEETH THE MORNING OF SURGERY WITH YOUR REGULAR TOOTHPASTE   [place]- Preparing For Surgery  Before surgery, you can play an important role. Because skin is not sterile, your skin needs to be as free of germs as possible. You can reduce the number of germs on your skin by washing with CHG (chlorahexidine gluconate) Soap before surgery.  CHG is an antiseptic cleaner which kills germs and bonds with the skin to continue killing germs even after washing.     Please do not use if you have an allergy to CHG or antibacterial soaps. If your skin becomes reddened/irritated stop using the CHG.  Do not shave (including legs and underarms) for at least 48 hours prior to first CHG shower. It is OK to shave your face.  Please follow these instructions carefully.     Shower the NIGHT BEFORE SURGERY and the MORNING OF SURGERY with CHG  Soap.   If you chose to wash your hair, wash your hair first as usual with your normal shampoo. After you shampoo, rinse your hair and body thoroughly to remove the shampoo.  Then [employer] and genitals (private parts) with your normal soap and rinse thoroughly to remove soap.  After that Use CHG Soap as you would any other liquid soap. You can apply CHG directly to the skin and wash gently with a scrungie or a clean washcloth.   Apply the CHG Soap to your body ONLY FROM THE NECK DOWN.  Do not use on open wounds or open sores. Avoid contact with your eyes, ears, mouth and genitals (private parts).   Wash thoroughly, paying special attention to the area where your surgery will be performed.  Thoroughly rinse your body with warm water from the neck down.  DO NOT shower/wash with your normal soap after using and rinsing off the  CHG Soap.  Pat yourself dry with a CLEAN TOWEL.  Wear CLEAN PAJAMAS to bed the night before surgery  Place CLEAN SHEETS on your bed the night before your surgery  DO NOT SLEEP WITH PETS.   Day of Surgery: Shower as instructed above, using CHG soap. Wear Clean/Comfortable clothing the morning of surgery Do not apply any deodorants/lotions.   Remember to brush your teeth WITH YOUR REGULAR TOOTHPASTE.  Do not wear jewelry or makeup. Do not wear lotions, powders, perfumes/cologne or deodorant. Do not shave 48 hours prior to surgery.  Men may shave face and neck. Do not bring valuables to the hospital. Do not wear nail polish, gel polish, artificial nails, or any other type of covering on natural nails (fingers and toes) If you have artificial nails or gel coating that need to be removed by a nail salon, please have this removed prior to surgery. Artificial nails or gel coating may interfere with anesthesia's ability to adequately monitor your vital signs.  [facility] is not responsible for any belongings or valuables.    Do NOT Smoke (Tobacco/Vaping)  24 hours  prior to your procedure  If you use a CPAP at night, you may bring your mask for your overnight stay.   Contacts, glasses, hearing aids, dentures or partials may not be worn into surgery, please bring cases for these belongings   For patients admitted to the hospital, discharge time will be determined by your treatment team.   Patients discharged the day of surgery will not be allowed to drive home, and someone needs to stay with them for 24 hours.  SURGICAL WAITING ROOM VISITATION Patients having surgery or a procedure may have no more than 2 support people in the waiting area - these visitors may rotate.   Children under the age of 58 must have an adult with them who is not the patient. If the patient needs to stay at the hospital during part of their recovery, the visitor guidelines for inpatient rooms apply.  One person may accompany patient to Pre-surgery, no rotating.  You will be sent out when the staff needs the entire room. Please refer to [email or web address] for the visitor guidelines for Inpatients (after your surgery is over and you are in a regular room).  If you received a COVID test during your pre-op visit, it is requested that you wear a mask when out in public, stay away from anyone that may not be feeling well, and notify your surgeon if you develop symptoms. If you have been in contact with anyone that has tested positive in the last 10 days, please notify your surgeon.    Please read over the following fact sheets that you were given.

## 2022-07-23 ENCOUNTER — Other Ambulatory Visit: Payer: Self-pay

## 2022-07-23 ENCOUNTER — Inpatient Hospital Stay (HOSPITAL_COMMUNITY): Payer: 59 | Admitting: Anesthesiology

## 2022-07-23 ENCOUNTER — Inpatient Hospital Stay (HOSPITAL_COMMUNITY)
Admission: RE | Admit: 2022-07-23 | Discharge: 2022-07-28 | DRG: 236 | Disposition: A | Discharge status: Home Health | Payer: 59 | Attending: Thoracic Surgery (Cardiothoracic Vascular Surgery) | Admitting: Thoracic Surgery (Cardiothoracic Vascular Surgery)

## 2022-07-23 ENCOUNTER — Inpatient Hospital Stay (HOSPITAL_COMMUNITY)
Admission: RE | Disposition: A | Discharge status: Home / Self Care | Payer: Self-pay | Source: Home / Self Care | Attending: Thoracic Surgery (Cardiothoracic Vascular Surgery)

## 2022-07-23 ENCOUNTER — Ambulatory Visit: Payer: 59 | Admitting: Cardiovascular Disease

## 2022-07-23 ENCOUNTER — Inpatient Hospital Stay (HOSPITAL_COMMUNITY): Payer: 59

## 2022-07-23 ENCOUNTER — Encounter (HOSPITAL_COMMUNITY): Payer: Self-pay | Admitting: Thoracic Surgery (Cardiothoracic Vascular Surgery)

## 2022-07-23 DIAGNOSIS — Z9911 Dependence on respirator [ventilator] status: Secondary | ICD-10-CM | POA: Diagnosis not present

## 2022-07-23 DIAGNOSIS — E1165 Type 2 diabetes mellitus with hyperglycemia: Secondary | ICD-10-CM | POA: Diagnosis not present

## 2022-07-23 DIAGNOSIS — I10 Essential (primary) hypertension: Secondary | ICD-10-CM

## 2022-07-23 DIAGNOSIS — D62 Acute posthemorrhagic anemia: Secondary | ICD-10-CM | POA: Diagnosis not present

## 2022-07-23 DIAGNOSIS — E785 Hyperlipidemia, unspecified: Secondary | ICD-10-CM | POA: Diagnosis present

## 2022-07-23 DIAGNOSIS — E119 Type 2 diabetes mellitus without complications: Secondary | ICD-10-CM

## 2022-07-23 DIAGNOSIS — E8770 Fluid overload, unspecified: Secondary | ICD-10-CM | POA: Diagnosis not present

## 2022-07-23 DIAGNOSIS — M1990 Unspecified osteoarthritis, unspecified site: Secondary | ICD-10-CM

## 2022-07-23 DIAGNOSIS — I4891 Unspecified atrial fibrillation: Secondary | ICD-10-CM | POA: Diagnosis not present

## 2022-07-23 DIAGNOSIS — I454 Nonspecific intraventricular block: Secondary | ICD-10-CM | POA: Diagnosis not present

## 2022-07-23 DIAGNOSIS — Z79899 Other long term (current) drug therapy: Secondary | ICD-10-CM | POA: Diagnosis not present

## 2022-07-23 DIAGNOSIS — D6959 Other secondary thrombocytopenia: Secondary | ICD-10-CM | POA: Diagnosis not present

## 2022-07-23 DIAGNOSIS — I2510 Atherosclerotic heart disease of native coronary artery without angina pectoris: Secondary | ICD-10-CM

## 2022-07-23 DIAGNOSIS — Z978 Presence of other specified devices: Secondary | ICD-10-CM

## 2022-07-23 DIAGNOSIS — J9811 Atelectasis: Secondary | ICD-10-CM | POA: Diagnosis present

## 2022-07-23 DIAGNOSIS — Z951 Presence of aortocoronary bypass graft: Secondary | ICD-10-CM

## 2022-07-23 DIAGNOSIS — Z8249 Family history of ischemic heart disease and other diseases of the circulatory system: Secondary | ICD-10-CM | POA: Diagnosis not present

## 2022-07-23 DIAGNOSIS — I159 Secondary hypertension, unspecified: Secondary | ICD-10-CM

## 2022-07-23 DIAGNOSIS — Z1152 Encounter for screening for COVID-19: Secondary | ICD-10-CM

## 2022-07-23 DIAGNOSIS — D72829 Elevated white blood cell count, unspecified: Secondary | ICD-10-CM | POA: Diagnosis not present

## 2022-07-23 HISTORY — PX: CORONARY ARTERY BYPASS GRAFT: SHX141

## 2022-07-23 HISTORY — PX: RADIAL ARTERY HARVEST: SHX5067

## 2022-07-23 HISTORY — PX: TEE WITHOUT CARDIOVERSION: SHX5443

## 2022-07-23 LAB — POCT I-STAT, CHEM 8
BUN: 13 mg/dL (ref 8–23)
BUN: 12 mg/dL (ref 8–23)
BUN: 11 mg/dL (ref 8–23)
BUN: 10 mg/dL (ref 8–23)
BUN: 10 mg/dL (ref 8–23)
Calcium, Ion: 1.2 mmol/L (ref 1.15–1.40)
Calcium, Ion: 1.21 mmol/L (ref 1.15–1.40)
Calcium, Ion: 1.1 mmol/L — ABNORMAL LOW (ref 1.15–1.40)
Calcium, Ion: 1.09 mmol/L — ABNORMAL LOW (ref 1.15–1.40)
Calcium, Ion: 1.28 mmol/L (ref 1.15–1.40)
Chloride: 102 mmol/L (ref 98–111)
Chloride: 102 mmol/L (ref 98–111)
Chloride: 103 mmol/L (ref 98–111)
Chloride: 102 mmol/L (ref 98–111)
Chloride: 105 mmol/L (ref 98–111)
Creatinine, Ser: 0.6 mg/dL — ABNORMAL LOW (ref 0.61–1.24)
Creatinine, Ser: 0.6 mg/dL — ABNORMAL LOW (ref 0.61–1.24)
Creatinine, Ser: 0.5 mg/dL — ABNORMAL LOW (ref 0.61–1.24)
Creatinine, Ser: 0.5 mg/dL — ABNORMAL LOW (ref 0.61–1.24)
Creatinine, Ser: 0.6 mg/dL — ABNORMAL LOW (ref 0.61–1.24)
Glucose, Bld: 190 mg/dL — ABNORMAL HIGH (ref 70–99)
Glucose, Bld: 162 mg/dL — ABNORMAL HIGH (ref 70–99)
Glucose, Bld: 153 mg/dL — ABNORMAL HIGH (ref 70–99)
Glucose, Bld: 166 mg/dL — ABNORMAL HIGH (ref 70–99)
Glucose, Bld: 183 mg/dL — ABNORMAL HIGH (ref 70–99)
HCT: 41 % (ref 39.0–52.0)
HCT: 39 % (ref 39.0–52.0)
HCT: 33 % — ABNORMAL LOW (ref 39.0–52.0)
HCT: 30 % — ABNORMAL LOW (ref 39.0–52.0)
HCT: 31 % — ABNORMAL LOW (ref 39.0–52.0)
Hemoglobin: 13.9 g/dL (ref 13.0–17.0)
Hemoglobin: 13.3 g/dL (ref 13.0–17.0)
Hemoglobin: 11.2 g/dL — ABNORMAL LOW (ref 13.0–17.0)
Hemoglobin: 10.2 g/dL — ABNORMAL LOW (ref 13.0–17.0)
Hemoglobin: 10.5 g/dL — ABNORMAL LOW (ref 13.0–17.0)
Potassium: 3.8 mmol/L (ref 3.5–5.1)
Potassium: 3.8 mmol/L (ref 3.5–5.1)
Potassium: 3.7 mmol/L (ref 3.5–5.1)
Potassium: 3.5 mmol/L (ref 3.5–5.1)
Potassium: 3.9 mmol/L (ref 3.5–5.1)
Sodium: 141 mmol/L (ref 135–145)
Sodium: 142 mmol/L (ref 135–145)
Sodium: 140 mmol/L (ref 135–145)
Sodium: 141 mmol/L (ref 135–145)
Sodium: 142 mmol/L (ref 135–145)
TCO2: 26 mmol/L (ref 22–32)
TCO2: 26 mmol/L (ref 22–32)
TCO2: 27 mmol/L (ref 22–32)
TCO2: 24 mmol/L (ref 22–32)
TCO2: 27 mmol/L (ref 22–32)

## 2022-07-23 LAB — POCT I-STAT EG7
Acid-Base Excess: 1 mmol/L (ref 0.0–2.0)
Bicarbonate: 27.4 mmol/L (ref 20.0–28.0)
Calcium, Ion: 1.09 mmol/L — ABNORMAL LOW (ref 1.15–1.40)
HCT: 32 % — ABNORMAL LOW (ref 39.0–52.0)
Hemoglobin: 10.9 g/dL — ABNORMAL LOW (ref 13.0–17.0)
O2 Saturation: 73 %
Potassium: 4.4 mmol/L (ref 3.5–5.1)
Sodium: 139 mmol/L (ref 135–145)
TCO2: 29 mmol/L (ref 22–32)
pCO2, Ven: 50.7 mmHg (ref 44–60)
pH, Ven: 7.34 (ref 7.25–7.43)
pO2, Ven: 41 mmHg (ref 32–45)

## 2022-07-23 LAB — POCT I-STAT 7, (LYTES, BLD GAS, ICA,H+H)
Acid-Base Excess: 0 mmol/L (ref 0.0–2.0)
Acid-Base Excess: 0 mmol/L (ref 0.0–2.0)
Acid-base deficit: 3 mmol/L — ABNORMAL HIGH (ref 0.0–2.0)
Acid-base deficit: 6 mmol/L — ABNORMAL HIGH (ref 0.0–2.0)
Acid-base deficit: 1 mmol/L (ref 0.0–2.0)
Acid-base deficit: 1 mmol/L (ref 0.0–2.0)
Bicarbonate: 25.4 mmol/L (ref 20.0–28.0)
Bicarbonate: 25.1 mmol/L (ref 20.0–28.0)
Bicarbonate: 23.8 mmol/L (ref 20.0–28.0)
Bicarbonate: 19.6 mmol/L — ABNORMAL LOW (ref 20.0–28.0)
Bicarbonate: 24.5 mmol/L (ref 20.0–28.0)
Bicarbonate: 24.2 mmol/L (ref 20.0–28.0)
Calcium, Ion: 1.05 mmol/L — ABNORMAL LOW (ref 1.15–1.40)
Calcium, Ion: 1.03 mmol/L — ABNORMAL LOW (ref 1.15–1.40)
Calcium, Ion: 1.28 mmol/L (ref 1.15–1.40)
Calcium, Ion: 1.2 mmol/L (ref 1.15–1.40)
Calcium, Ion: 1.18 mmol/L (ref 1.15–1.40)
Calcium, Ion: 1.19 mmol/L (ref 1.15–1.40)
HCT: 32 % — ABNORMAL LOW (ref 39.0–52.0)
HCT: 30 % — ABNORMAL LOW (ref 39.0–52.0)
HCT: 35 % — ABNORMAL LOW (ref 39.0–52.0)
HCT: 36 % — ABNORMAL LOW (ref 39.0–52.0)
HCT: 33 % — ABNORMAL LOW (ref 39.0–52.0)
HCT: 35 % — ABNORMAL LOW (ref 39.0–52.0)
Hemoglobin: 10.9 g/dL — ABNORMAL LOW (ref 13.0–17.0)
Hemoglobin: 10.2 g/dL — ABNORMAL LOW (ref 13.0–17.0)
Hemoglobin: 11.9 g/dL — ABNORMAL LOW (ref 13.0–17.0)
Hemoglobin: 12.2 g/dL — ABNORMAL LOW (ref 13.0–17.0)
Hemoglobin: 11.2 g/dL — ABNORMAL LOW (ref 13.0–17.0)
Hemoglobin: 11.9 g/dL — ABNORMAL LOW (ref 13.0–17.0)
O2 Saturation: 100 %
O2 Saturation: 100 %
O2 Saturation: 95 %
O2 Saturation: 92 %
O2 Saturation: 95 %
O2 Saturation: 93 %
Patient temperature: 37.9
Patient temperature: 38
Patient temperature: 38.3
Potassium: 3.5 mmol/L (ref 3.5–5.1)
Potassium: 4 mmol/L (ref 3.5–5.1)
Potassium: 3.7 mmol/L (ref 3.5–5.1)
Potassium: 3.9 mmol/L (ref 3.5–5.1)
Potassium: 3.8 mmol/L (ref 3.5–5.1)
Potassium: 4 mmol/L (ref 3.5–5.1)
Sodium: 139 mmol/L (ref 135–145)
Sodium: 139 mmol/L (ref 135–145)
Sodium: 142 mmol/L (ref 135–145)
Sodium: 142 mmol/L (ref 135–145)
Sodium: 142 mmol/L (ref 135–145)
Sodium: 143 mmol/L (ref 135–145)
TCO2: 27 mmol/L (ref 22–32)
TCO2: 26 mmol/L (ref 22–32)
TCO2: 25 mmol/L (ref 22–32)
TCO2: 21 mmol/L — ABNORMAL LOW (ref 22–32)
TCO2: 26 mmol/L (ref 22–32)
TCO2: 26 mmol/L (ref 22–32)
pCO2 arterial: 44.1 mmHg (ref 32–48)
pCO2 arterial: 42.2 mmHg (ref 32–48)
pCO2 arterial: 47.1 mmHg (ref 32–48)
pCO2 arterial: 41.3 mmHg (ref 32–48)
pCO2 arterial: 44.6 mmHg (ref 32–48)
pCO2 arterial: 45.2 mmHg (ref 32–48)
pH, Arterial: 7.369 (ref 7.35–7.45)
pH, Arterial: 7.382 (ref 7.35–7.45)
pH, Arterial: 7.311 — ABNORMAL LOW (ref 7.35–7.45)
pH, Arterial: 7.29 — ABNORMAL LOW (ref 7.35–7.45)
pH, Arterial: 7.352 (ref 7.35–7.45)
pH, Arterial: 7.343 — ABNORMAL LOW (ref 7.35–7.45)
pO2, Arterial: 333 mmHg — ABNORMAL HIGH (ref 83–108)
pO2, Arterial: 231 mmHg — ABNORMAL HIGH (ref 83–108)
pO2, Arterial: 82 mmHg — ABNORMAL LOW (ref 83–108)
pO2, Arterial: 76 mmHg — ABNORMAL LOW (ref 83–108)
pO2, Arterial: 83 mmHg (ref 83–108)
pO2, Arterial: 75 mmHg — ABNORMAL LOW (ref 83–108)

## 2022-07-23 LAB — BASIC METABOLIC PANEL
Anion gap: 7 (ref 5–15)
BUN: 9 mg/dL (ref 8–23)
CO2: 23 mmol/L (ref 22–32)
Calcium: 7.1 mg/dL — ABNORMAL LOW (ref 8.9–10.3)
Chloride: 111 mmol/L (ref 98–111)
Creatinine, Ser: 0.67 mg/dL (ref 0.61–1.24)
GFR, Estimated: >60 mL/min (ref >60)
Glucose, Bld: 129 mg/dL — ABNORMAL HIGH (ref 70–99)
Potassium: 3.5 mmol/L (ref 3.5–5.1)
Sodium: 141 mmol/L (ref 135–145)

## 2022-07-23 LAB — APTT: aPTT: 27 seconds (ref 24–36)

## 2022-07-23 LAB — GLUCOSE, CAPILLARY
Glucose-Capillary: 116 mg/dL — ABNORMAL HIGH (ref 70–99)
Glucose-Capillary: 117 mg/dL — ABNORMAL HIGH (ref 70–99)
Glucose-Capillary: 120 mg/dL — ABNORMAL HIGH (ref 70–99)
Glucose-Capillary: 132 mg/dL — ABNORMAL HIGH (ref 70–99)
Glucose-Capillary: 141 mg/dL — ABNORMAL HIGH (ref 70–99)
Glucose-Capillary: 150 mg/dL — ABNORMAL HIGH (ref 70–99)
Glucose-Capillary: 155 mg/dL — ABNORMAL HIGH (ref 70–99)
Glucose-Capillary: 167 mg/dL — ABNORMAL HIGH (ref 70–99)
Glucose-Capillary: 180 mg/dL — ABNORMAL HIGH (ref 70–99)
Glucose-Capillary: 188 mg/dL — ABNORMAL HIGH (ref 70–99)
Glucose-Capillary: 79 mg/dL (ref 70–99)

## 2022-07-23 LAB — PLATELET COUNT: Platelets: 129 10*3/uL — ABNORMAL LOW (ref 150–400)

## 2022-07-23 LAB — CBC
HCT: 35.8 % — ABNORMAL LOW (ref 39.0–52.0)
HCT: 36.9 % — ABNORMAL LOW (ref 39.0–52.0)
Hemoglobin: 12.8 g/dL — ABNORMAL LOW (ref 13.0–17.0)
Hemoglobin: 12.6 g/dL — ABNORMAL LOW (ref 13.0–17.0)
MCH: 31.5 pg (ref 26.0–34.0)
MCH: 30.7 pg (ref 26.0–34.0)
MCHC: 35.8 g/dL (ref 30.0–36.0)
MCHC: 34.1 g/dL (ref 30.0–36.0)
MCV: 88.2 fL (ref 80.0–100.0)
MCV: 89.8 fL (ref 80.0–100.0)
Platelets: 106 10*3/uL — ABNORMAL LOW (ref 150–400)
Platelets: 120 10*3/uL — ABNORMAL LOW (ref 150–400)
RBC: 4.06 MIL/uL — ABNORMAL LOW (ref 4.22–5.81)
RBC: 4.11 MIL/uL — ABNORMAL LOW (ref 4.22–5.81)
RDW: 12.3 % (ref 11.5–15.5)
RDW: 12.4 % (ref 11.5–15.5)
WBC: 15.3 10*3/uL — ABNORMAL HIGH (ref 4.0–10.5)
WBC: 13.3 10*3/uL — ABNORMAL HIGH (ref 4.0–10.5)
nRBC: 0 % (ref 0.0–0.2)
nRBC: 0 % (ref 0.0–0.2)

## 2022-07-23 LAB — PROTIME-INR
INR: 1.4 — ABNORMAL HIGH (ref 0.8–1.2)
Prothrombin Time: 16.7 seconds — ABNORMAL HIGH (ref 11.4–15.2)

## 2022-07-23 LAB — MAGNESIUM: Magnesium: 2.3 mg/dL (ref 1.7–2.4)

## 2022-07-23 LAB — HEMOGLOBIN AND HEMATOCRIT, BLOOD
HCT: 32.8 % — ABNORMAL LOW (ref 39.0–52.0)
Hemoglobin: 12 g/dL — ABNORMAL LOW (ref 13.0–17.0)

## 2022-07-23 LAB — PREPARE RBC (CROSSMATCH)

## 2022-07-23 LAB — ABO/RH: ABO/RH(D): A POS

## 2022-07-23 SURGERY — CORONARY ARTERY BYPASS GRAFTING (CABG)
Anesthesia: General | Site: Chest

## 2022-07-23 MED ORDER — SODIUM BICARBONATE 8.4 % IV SOLN
100.0000 meq | Freq: Once | INTRAVENOUS | Status: AC
  Administered 2022-07-23: 100 meq via INTRAVENOUS

## 2022-07-23 MED ORDER — MIDAZOLAM HCL 2 MG/2ML IJ SOLN
2.0000 mg | INTRAMUSCULAR | Status: DC | PRN
  Administered 2022-07-23: 2 mg via INTRAVENOUS
  Filled 2022-07-23: qty 2

## 2022-07-23 MED ORDER — FAMOTIDINE IN NACL 20-0.9 MG/50ML-% IV SOLN
20.0000 mg | Freq: Two times a day (BID) | INTRAVENOUS | Status: AC
  Administered 2022-07-23 (×2): 20 mg via INTRAVENOUS
  Filled 2022-07-23 (×2): qty 50

## 2022-07-23 MED ORDER — ONDANSETRON HCL 4 MG/2ML IJ SOLN: 4.0000 mg | Freq: Four times a day (QID) | INTRAMUSCULAR | Status: DC | PRN

## 2022-07-23 MED ORDER — TRAMADOL HCL 50 MG PO TABS
50.0000 mg | ORAL_TABLET | ORAL | Status: DC | PRN
  Administered 2022-07-24 – 2022-07-25 (×3): 100 mg via ORAL
  Filled 2022-07-23 (×4): qty 2

## 2022-07-23 MED ORDER — FENTANYL CITRATE (PF) 250 MCG/5ML IJ SOLN
INTRAMUSCULAR | Status: AC
  Filled 2022-07-23: qty 5

## 2022-07-23 MED ORDER — IPRATROPIUM-ALBUTEROL 0.5-2.5 (3) MG/3ML IN SOLN: 3.0000 mL | Freq: Four times a day (QID) | RESPIRATORY_TRACT | Status: DC | PRN

## 2022-07-23 MED ORDER — SODIUM CHLORIDE 0.9 % IV SOLN: INTRAVENOUS | Status: DC

## 2022-07-23 MED ORDER — LACTATED RINGERS IV SOLN: INTRAVENOUS | Status: DC | PRN

## 2022-07-23 MED ORDER — EPHEDRINE 5 MG/ML INJ
INTRAVENOUS | Status: AC
  Filled 2022-07-23: qty 5

## 2022-07-23 MED ORDER — PHENYLEPHRINE 80 MCG/ML (10ML) SYRINGE FOR IV PUSH (FOR BLOOD PRESSURE SUPPORT)
PREFILLED_SYRINGE | INTRAVENOUS | Status: AC
  Filled 2022-07-23: qty 10

## 2022-07-23 MED ORDER — POLYETHYLENE GLYCOL 3350 17 G PO PACK
17.0000 g | PACK | Freq: Every day | ORAL | Status: DC
  Administered 2022-07-24: 17 g

## 2022-07-23 MED ORDER — SODIUM CHLORIDE 0.9 % IV SOLN: INTRAVENOUS | Status: DC | PRN

## 2022-07-23 MED ORDER — HYDRALAZINE HCL 20 MG/ML IJ SOLN
10.0000 mg | Freq: Four times a day (QID) | INTRAMUSCULAR | Status: DC | PRN
  Administered 2022-07-27 – 2022-07-28 (×2): 10 mg via INTRAVENOUS
  Filled 2022-07-23 (×2): qty 1

## 2022-07-23 MED ORDER — ORAL CARE MOUTH RINSE: 15.0000 mL | OROMUCOSAL | Status: DC | PRN

## 2022-07-23 MED ORDER — LACTATED RINGERS IV SOLN: 500.0000 mL | Freq: Once | INTRAVENOUS | Status: DC | PRN

## 2022-07-23 MED ORDER — ORAL CARE MOUTH RINSE
15.0000 mL | OROMUCOSAL | Status: DC
  Administered 2022-07-23 – 2022-07-24 (×3): 15 mL via OROMUCOSAL

## 2022-07-23 MED ORDER — NOREPINEPHRINE 4 MG/250ML-% IV SOLN: 0.0000 ug/min | INTRAVENOUS | Status: DC

## 2022-07-23 MED ORDER — CHLORHEXIDINE GLUCONATE 0.12 % MT SOLN
15.0000 mL | Freq: Once | OROMUCOSAL | Status: AC
  Administered 2022-07-23: 15 mL via OROMUCOSAL
  Filled 2022-07-23: qty 15

## 2022-07-23 MED ORDER — ATORVASTATIN CALCIUM 40 MG PO TABS
40.0000 mg | ORAL_TABLET | Freq: Every day | ORAL | Status: DC
  Administered 2022-07-24 – 2022-07-28 (×5): 40 mg via ORAL
  Filled 2022-07-23 (×5): qty 1

## 2022-07-23 MED ORDER — EPHEDRINE SULFATE-NACL 50-0.9 MG/10ML-% IV SOSY
PREFILLED_SYRINGE | INTRAVENOUS | Status: DC | PRN
  Administered 2022-07-23: 5 mg via INTRAVENOUS

## 2022-07-23 MED ORDER — CHLORHEXIDINE GLUCONATE 4 % EX LIQD: 30.0000 mL | CUTANEOUS | Status: DC

## 2022-07-23 MED ORDER — NICARDIPINE HCL IN NACL 20-0.86 MG/200ML-% IV SOLN
0.0000 mg/h | INTRAVENOUS | Status: DC
  Administered 2022-07-23: 2.5 mg/h via INTRAVENOUS
  Administered 2022-07-23: 7.5 mg/h via INTRAVENOUS
  Administered 2022-07-24: 2.5 mg/h via INTRAVENOUS
  Filled 2022-07-23 (×3): qty 200

## 2022-07-23 MED ORDER — ORAL CARE MOUTH RINSE
15.0000 mL | OROMUCOSAL | Status: DC
  Administered 2022-07-23 – 2022-07-24 (×6): 15 mL via OROMUCOSAL

## 2022-07-23 MED ORDER — ACETAMINOPHEN 500 MG PO TABS
1000.0000 mg | ORAL_TABLET | Freq: Four times a day (QID) | ORAL | Status: DC
  Administered 2022-07-23 – 2022-07-28 (×17): 1000 mg via ORAL
  Filled 2022-07-23 (×16): qty 2

## 2022-07-23 MED ORDER — ACETAMINOPHEN 650 MG RE SUPP
650.0000 mg | Freq: Once | RECTAL | Status: AC
  Administered 2022-07-23: 650 mg via RECTAL

## 2022-07-23 MED ORDER — NITROGLYCERIN IN D5W 200-5 MCG/ML-% IV SOLN: 0.0000 ug/min | INTRAVENOUS | Status: DC

## 2022-07-23 MED ORDER — PHENYLEPHRINE HCL-NACL 20-0.9 MG/250ML-% IV SOLN: 0.0000 ug/min | INTRAVENOUS | Status: DC

## 2022-07-23 MED ORDER — KETOROLAC TROMETHAMINE 15 MG/ML IJ SOLN
15.0000 mg | Freq: Once | INTRAMUSCULAR | Status: AC
  Administered 2022-07-23: 15 mg via INTRAVENOUS
  Filled 2022-07-23: qty 1

## 2022-07-23 MED ORDER — ASPIRIN 81 MG PO CHEW: 324.0000 mg | CHEWABLE_TABLET | Freq: Every day | ORAL | Status: DC

## 2022-07-23 MED ORDER — HEPARIN SODIUM (PORCINE) 1000 UNIT/ML IJ SOLN
INTRAMUSCULAR | Status: AC
  Filled 2022-07-23: qty 10

## 2022-07-23 MED ORDER — PLASMA-LYTE A IV SOLN
INTRAVENOUS | Status: DC | PRN
  Administered 2022-07-23: 500 mL via INTRAVASCULAR

## 2022-07-23 MED ORDER — FENTANYL CITRATE PF 50 MCG/ML IJ SOSY: 50.0000 ug | PREFILLED_SYRINGE | INTRAMUSCULAR | Status: DC | PRN

## 2022-07-23 MED ORDER — BISACODYL 5 MG PO TBEC
10.0000 mg | DELAYED_RELEASE_TABLET | Freq: Every day | ORAL | Status: DC
  Administered 2022-07-24 – 2022-07-28 (×5): 10 mg via ORAL
  Filled 2022-07-23 (×5): qty 2

## 2022-07-23 MED ORDER — LIDOCAINE 2% (20 MG/ML) 5 ML SYRINGE
INTRAMUSCULAR | Status: AC
  Filled 2022-07-23: qty 5

## 2022-07-23 MED ORDER — DEXTROSE 50 % IV SOLN: 0.0000 mL | INTRAVENOUS | Status: DC | PRN

## 2022-07-23 MED ORDER — ~~LOC~~ CARDIAC SURGERY, PATIENT & FAMILY EDUCATION
Freq: Once | Status: DC
  Filled 2022-07-23: qty 1

## 2022-07-23 MED ORDER — PROPOFOL 10 MG/ML IV BOLUS
INTRAVENOUS | Status: AC
  Filled 2022-07-23: qty 20

## 2022-07-23 MED ORDER — ROCURONIUM BROMIDE 10 MG/ML (PF) SYRINGE
PREFILLED_SYRINGE | INTRAVENOUS | Status: DC | PRN
  Administered 2022-07-23: 50 mg via INTRAVENOUS
  Administered 2022-07-23: 70 mg via INTRAVENOUS
  Administered 2022-07-23 (×2): 30 mg via INTRAVENOUS
  Administered 2022-07-23: 50 mg via INTRAVENOUS

## 2022-07-23 MED ORDER — LACTATED RINGERS IV SOLN: INTRAVENOUS | Status: DC

## 2022-07-23 MED ORDER — MIDAZOLAM HCL (PF) 5 MG/ML IJ SOLN
INTRAMUSCULAR | Status: DC | PRN
  Administered 2022-07-23 (×4): 2 mg via INTRAVENOUS

## 2022-07-23 MED ORDER — 0.9 % SODIUM CHLORIDE (POUR BTL) OPTIME
TOPICAL | Status: DC | PRN
  Administered 2022-07-23: 5000 mL

## 2022-07-23 MED ORDER — PROPOFOL 10 MG/ML IV BOLUS
INTRAVENOUS | Status: DC | PRN
  Administered 2022-07-23 (×2): 100 mg via INTRAVENOUS

## 2022-07-23 MED ORDER — CHLORHEXIDINE GLUCONATE CLOTH 2 % EX PADS
6.0000 | MEDICATED_PAD | Freq: Every day | CUTANEOUS | Status: DC
  Administered 2022-07-23 – 2022-07-28 (×6): 6 via TOPICAL

## 2022-07-23 MED ORDER — ONDANSETRON HCL 4 MG/2ML IJ SOLN
INTRAMUSCULAR | Status: DC | PRN
  Administered 2022-07-23: 4 mg via INTRAVENOUS

## 2022-07-23 MED ORDER — METOPROLOL TARTRATE 12.5 MG HALF TABLET
12.5000 mg | ORAL_TABLET | Freq: Two times a day (BID) | ORAL | Status: DC
  Administered 2022-07-23 – 2022-07-24 (×2): 12.5 mg via ORAL
  Filled 2022-07-23: qty 1

## 2022-07-23 MED ORDER — MIDAZOLAM HCL (PF) 10 MG/2ML IJ SOLN
INTRAMUSCULAR | Status: AC
  Filled 2022-07-23: qty 2

## 2022-07-23 MED ORDER — DOCUSATE SODIUM 50 MG/5ML PO LIQD
100.0000 mg | Freq: Two times a day (BID) | ORAL | Status: DC
  Administered 2022-07-24: 100 mg
  Filled 2022-07-23: qty 10

## 2022-07-23 MED ORDER — PROTAMINE SULFATE 10 MG/ML IV SOLN
INTRAVENOUS | Status: AC
  Filled 2022-07-23: qty 50

## 2022-07-23 MED ORDER — METOPROLOL TARTRATE 5 MG/5ML IV SOLN: 2.5000 mg | INTRAVENOUS | Status: DC | PRN

## 2022-07-23 MED ORDER — PROTAMINE SULFATE 10 MG/ML IV SOLN
INTRAVENOUS | Status: DC | PRN
  Administered 2022-07-23: 20 mg via INTRAVENOUS
  Administered 2022-07-23: 300 mg via INTRAVENOUS

## 2022-07-23 MED ORDER — CEFAZOLIN SODIUM-DEXTROSE 2-4 GM/100ML-% IV SOLN
2.0000 g | Freq: Three times a day (TID) | INTRAVENOUS | Status: AC
  Administered 2022-07-23 – 2022-07-25 (×5): 2 g via INTRAVENOUS
  Filled 2022-07-23 (×4): qty 100

## 2022-07-23 MED ORDER — INSULIN REGULAR(HUMAN) IN NACL 100-0.9 UT/100ML-% IV SOLN: INTRAVENOUS | Status: DC

## 2022-07-23 MED ORDER — METOPROLOL TARTRATE 12.5 MG HALF TABLET
12.5000 mg | ORAL_TABLET | Freq: Once | ORAL | Status: DC
  Filled 2022-07-23: qty 1

## 2022-07-23 MED ORDER — OXYCODONE HCL 5 MG PO TABS
5.0000 mg | ORAL_TABLET | ORAL | Status: DC | PRN
  Administered 2022-07-23 – 2022-07-26 (×12): 10 mg via ORAL
  Filled 2022-07-23 (×12): qty 2

## 2022-07-23 MED ORDER — DOCUSATE SODIUM 100 MG PO CAPS: 200.0000 mg | ORAL_CAPSULE | Freq: Every day | ORAL | Status: DC

## 2022-07-23 MED ORDER — PHENYLEPHRINE 80 MCG/ML (10ML) SYRINGE FOR IV PUSH (FOR BLOOD PRESSURE SUPPORT)
PREFILLED_SYRINGE | INTRAVENOUS | Status: DC | PRN
  Administered 2022-07-23: 40 ug via INTRAVENOUS
  Administered 2022-07-23: 80 ug via INTRAVENOUS

## 2022-07-23 MED ORDER — SODIUM CHLORIDE 0.9 % IV SOLN
250.0000 mL | INTRAVENOUS | Status: DC
  Administered 2022-07-24: 250 mL via INTRAVENOUS

## 2022-07-23 MED ORDER — SODIUM CHLORIDE 0.45 % IV SOLN: INTRAVENOUS | Status: DC | PRN

## 2022-07-23 MED ORDER — ROCURONIUM BROMIDE 10 MG/ML (PF) SYRINGE
PREFILLED_SYRINGE | INTRAVENOUS | Status: AC
  Filled 2022-07-23: qty 10

## 2022-07-23 MED ORDER — PANTOPRAZOLE SODIUM 40 MG PO TBEC
40.0000 mg | DELAYED_RELEASE_TABLET | Freq: Every day | ORAL | Status: DC
  Administered 2022-07-25 – 2022-07-28 (×4): 40 mg via ORAL
  Filled 2022-07-23 (×4): qty 1

## 2022-07-23 MED ORDER — MAGNESIUM SULFATE 4 GM/100ML IV SOLN
4.0000 g | Freq: Once | INTRAVENOUS | Status: AC
  Administered 2022-07-23: 4 g via INTRAVENOUS
  Filled 2022-07-23: qty 100

## 2022-07-23 MED ORDER — HEPARIN SODIUM (PORCINE) 1000 UNIT/ML IJ SOLN
INTRAMUSCULAR | Status: DC | PRN
  Administered 2022-07-23: 32000 [IU] via INTRAVENOUS

## 2022-07-23 MED ORDER — MORPHINE SULFATE (PF) 2 MG/ML IV SOLN
1.0000 mg | INTRAVENOUS | Status: DC | PRN
  Administered 2022-07-23 (×2): 2 mg via INTRAVENOUS
  Administered 2022-07-24: 4 mg via INTRAVENOUS
  Filled 2022-07-23 (×2): qty 1
  Filled 2022-07-23 (×2): qty 2

## 2022-07-23 MED ORDER — DOBUTAMINE IN D5W 4-5 MG/ML-% IV SOLN: 0.0000 ug/kg/min | INTRAVENOUS | Status: DC

## 2022-07-23 MED ORDER — FENTANYL CITRATE (PF) 250 MCG/5ML IJ SOLN
INTRAMUSCULAR | Status: DC | PRN
  Administered 2022-07-23: 150 ug via INTRAVENOUS
  Administered 2022-07-23: 300 ug via INTRAVENOUS
  Administered 2022-07-23: 100 ug via INTRAVENOUS
  Administered 2022-07-23: 350 ug via INTRAVENOUS
  Administered 2022-07-23: 150 ug via INTRAVENOUS
  Administered 2022-07-23: 50 ug via INTRAVENOUS
  Administered 2022-07-23: 100 ug via INTRAVENOUS
  Administered 2022-07-23: 50 ug via INTRAVENOUS

## 2022-07-23 MED ORDER — NITROGLYCERIN IN D5W 200-5 MCG/ML-% IV SOLN
INTRAVENOUS | Status: AC
  Administered 2022-07-23: 5 ug/min via INTRAVENOUS
  Filled 2022-07-23: qty 250

## 2022-07-23 MED ORDER — ACETAMINOPHEN 160 MG/5ML PO SOLN: 650.0000 mg | Freq: Once | ORAL | Status: AC

## 2022-07-23 MED ORDER — METOPROLOL TARTRATE 25 MG/10 ML ORAL SUSPENSION: 12.5000 mg | Freq: Two times a day (BID) | ORAL | Status: DC

## 2022-07-23 MED ORDER — BISACODYL 10 MG RE SUPP: 10.0000 mg | Freq: Every day | RECTAL | Status: DC

## 2022-07-23 MED ORDER — ALBUMIN HUMAN 5 % IV SOLN
250.0000 mL | INTRAVENOUS | Status: AC | PRN
  Administered 2022-07-23 (×3): 12.5 g via INTRAVENOUS
  Filled 2022-07-23 (×2): qty 250

## 2022-07-23 MED ORDER — ASPIRIN 325 MG PO TBEC
325.0000 mg | DELAYED_RELEASE_TABLET | Freq: Every day | ORAL | Status: DC
  Administered 2022-07-24 – 2022-07-28 (×5): 325 mg via ORAL
  Filled 2022-07-23 (×5): qty 1

## 2022-07-23 MED ORDER — ACETAMINOPHEN 160 MG/5ML PO SOLN: 1000.0000 mg | Freq: Four times a day (QID) | ORAL | Status: DC

## 2022-07-23 MED ORDER — DEXMEDETOMIDINE HCL IN NACL 400 MCG/100ML IV SOLN: 0.0000 ug/kg/h | INTRAVENOUS | Status: DC

## 2022-07-23 MED ORDER — POTASSIUM CHLORIDE 10 MEQ/50ML IV SOLN
10.0000 meq | INTRAVENOUS | Status: AC
  Administered 2022-07-23 (×3): 10 meq via INTRAVENOUS

## 2022-07-23 MED ORDER — SODIUM CHLORIDE 0.9% FLUSH: 3.0000 mL | INTRAVENOUS | Status: DC | PRN

## 2022-07-23 MED ORDER — SODIUM CHLORIDE (PF) 0.9 % IJ SOLN: OROMUCOSAL | Status: DC | PRN

## 2022-07-23 MED ORDER — ALBUMIN HUMAN 5 % IV SOLN: INTRAVENOUS | Status: DC | PRN

## 2022-07-23 MED ORDER — HEPARIN SODIUM (PORCINE) 1000 UNIT/ML IJ SOLN
INTRAMUSCULAR | Status: AC
  Filled 2022-07-23: qty 1

## 2022-07-23 MED ORDER — SODIUM CHLORIDE 0.9% FLUSH
3.0000 mL | Freq: Two times a day (BID) | INTRAVENOUS | Status: DC
  Administered 2022-07-24 – 2022-07-27 (×4): 3 mL via INTRAVENOUS

## 2022-07-23 MED ORDER — VANCOMYCIN HCL IN DEXTROSE 1-5 GM/200ML-% IV SOLN
1000.0000 mg | Freq: Once | INTRAVENOUS | Status: AC
  Administered 2022-07-23: 1000 mg via INTRAVENOUS
  Filled 2022-07-23: qty 200

## 2022-07-23 MED ORDER — CHLORHEXIDINE GLUCONATE 0.12 % MT SOLN
15.0000 mL | OROMUCOSAL | Status: AC
  Administered 2022-07-23: 15 mL via OROMUCOSAL
  Filled 2022-07-23: qty 15

## 2022-07-23 SURGICAL SUPPLY — 100 items
BAG DECANTER FOR FLEXI CONT (MISCELLANEOUS) ×3 IMPLANT
BLADE STERNUM SYSTEM 6 (BLADE) ×3 IMPLANT
BLADE SURG 15 STRL LF DISP TIS (BLADE) ×3 IMPLANT
BLADE SURG 15 STRL SS (BLADE) ×3
BNDG ELASTIC 4X5.8 VLCR STR LF (GAUZE/BANDAGES/DRESSINGS) ×6 IMPLANT
BNDG ELASTIC 6X5.8 VLCR STR LF (GAUZE/BANDAGES/DRESSINGS) ×3 IMPLANT
BNDG GAUZE DERMACEA FLUFF 4 (GAUZE/BANDAGES/DRESSINGS) ×3 IMPLANT
CABLE SURGICAL S-101-97-12 (CABLE) ×3 IMPLANT
CANISTER SUCT 3000ML PPV (MISCELLANEOUS) ×3 IMPLANT
CANNULA MC2 2 STG 32/40 NON-V (CANNULA) IMPLANT
CANNULA NON VENT 20FR 12 (CANNULA) ×3 IMPLANT
CANNULA NON VENT 22FR 12 (CANNULA) IMPLANT
CANNULA VENOUS 2 STG 32/40 (CANNULA) ×3
CANNULA VESSEL 3MM BLUNT TIP (CANNULA) IMPLANT
CATH ROBINSON RED A/P 18FR (CATHETERS) ×6 IMPLANT
CLIP TI MEDIUM 24 (CLIP) IMPLANT
CLIP TI WIDE RED SMALL 24 (CLIP) IMPLANT
CONN ST 1/2X1/2  BEN (MISCELLANEOUS)
CONN ST 1/2X1/2 BEN (MISCELLANEOUS) ×3 IMPLANT
CONNECTOR BLAKE 2:1 CARIO BLK (MISCELLANEOUS) ×3 IMPLANT
CONTAINER PROTECT SURGISLUSH (MISCELLANEOUS) ×6 IMPLANT
COVER MAYO STAND STRL (DRAPES) ×3 IMPLANT
DERMABOND ADVANCED .7 DNX12 (GAUZE/BANDAGES/DRESSINGS) IMPLANT
DRAIN CHANNEL 19F RND (DRAIN) ×9 IMPLANT
DRAIN CONNECTOR BLAKE 1:1 (MISCELLANEOUS) ×3 IMPLANT
DRAPE CARDIOVASCULAR INCISE (DRAPES) ×3
DRAPE EXTREMITY T 121X128X90 (DISPOSABLE) IMPLANT
DRAPE HALF SHEET 40X57 (DRAPES) ×3 IMPLANT
DRAPE INCISE IOBAN 66X45 STRL (DRAPES) IMPLANT
DRAPE SRG 135X102X78XABS (DRAPES) ×3 IMPLANT
DRAPE WARM FLUID 44X44 (DRAPES) ×3 IMPLANT
DRSG AQUACEL AG ADV 3.5X10 (GAUZE/BANDAGES/DRESSINGS) ×3 IMPLANT
DRSG AQUACEL AG ADV 3.5X14 (GAUZE/BANDAGES/DRESSINGS) ×3 IMPLANT
DRSG COVADERM 4X14 (GAUZE/BANDAGES/DRESSINGS) ×3 IMPLANT
ELECT BLADE 4.0 EZ CLEAN MEGAD (MISCELLANEOUS) ×3 IMPLANT
ELECT REM PT RETURN 9FT ADLT (ELECTROSURGICAL) ×6 IMPLANT
ELECTRODE BLDE 4.0 EZ CLN MEGD (MISCELLANEOUS) ×3 IMPLANT
ELECTRODE REM PT RTRN 9FT ADLT (ELECTROSURGICAL) ×6 IMPLANT
FELT TEFLON 1X6 (MISCELLANEOUS) ×6 IMPLANT
GAUZE 4X4 16PLY ~~LOC~~+RFID DBL (SPONGE) ×3 IMPLANT
GAUZE SPONGE 4X4 12PLY STRL (GAUZE/BANDAGES/DRESSINGS) ×6 IMPLANT
GLOVE BIO SURGEON STRL SZ 6 (GLOVE) IMPLANT
GLOVE BIO SURGEON STRL SZ 6.5 (GLOVE) IMPLANT
GLOVE BIO SURGEON STRL SZ7 (GLOVE) ×6 IMPLANT
GLOVE BIOGEL M STRL SZ7.5 (GLOVE) ×6 IMPLANT
GLOVE BIOGEL PI IND STRL 6.5 (GLOVE) IMPLANT
GLOVE BIOGEL PI IND STRL 7.0 (GLOVE) IMPLANT
GLOVE BIOGEL PI IND STRL 7.5 (GLOVE) IMPLANT
GLOVE BIOGEL PI IND STRL 8 (GLOVE) IMPLANT
GLOVE SS BIOGEL STRL SZ 7.5 (GLOVE) IMPLANT
GOWN STRL REUS W/ TWL LRG LVL3 (GOWN DISPOSABLE) ×12 IMPLANT
GOWN STRL REUS W/ TWL XL LVL3 (GOWN DISPOSABLE) ×6 IMPLANT
GOWN STRL REUS W/TWL LRG LVL3 (GOWN DISPOSABLE) ×18
GOWN STRL REUS W/TWL XL LVL3 (GOWN DISPOSABLE) ×6
HEMOSTAT POWDER SURGIFOAM 1G (HEMOSTASIS) ×6 IMPLANT
INSERT SUTURE HOLDER (MISCELLANEOUS) ×3 IMPLANT
KIT BASIN OR (CUSTOM PROCEDURE TRAY) ×3 IMPLANT
KIT SUCTION CATH 14FR (SUCTIONS) ×3 IMPLANT
KIT TURNOVER KIT B (KITS) ×3 IMPLANT
KIT VASOVIEW HEMOPRO 2 VH 4000 (KITS) ×3 IMPLANT
LEAD PACING MYOCARDI (MISCELLANEOUS) IMPLANT
MARKER GRAFT CORONARY BYPASS (MISCELLANEOUS) ×9 IMPLANT
NS IRRIG 1000ML POUR BTL (IV SOLUTION) ×15 IMPLANT
PACK E OPEN HEART (SUTURE) ×3 IMPLANT
PACK OPEN HEART (CUSTOM PROCEDURE TRAY) ×3 IMPLANT
PAD ARMBOARD 7.5X6 YLW CONV (MISCELLANEOUS) ×12 IMPLANT
PAD ELECT DEFIB RADIOL ZOLL (MISCELLANEOUS) ×3 IMPLANT
PENCIL BUTTON HOLSTER BLD 10FT (ELECTRODE) ×3 IMPLANT
POSITIONER HEAD DONUT 9IN (MISCELLANEOUS) ×3 IMPLANT
PUNCH AORTIC ROTATE 4.0MM (MISCELLANEOUS) ×3 IMPLANT
SET MPS 3-ND DEL (MISCELLANEOUS) IMPLANT
SHEARS HARMONIC 9CM CVD (BLADE) ×3 IMPLANT
SPONGE T-LAP 18X18 ~~LOC~~+RFID (SPONGE) ×12 IMPLANT
SUPPORT HEART JANKE-BARRON (MISCELLANEOUS) ×3 IMPLANT
SUT BONE WAX W31G (SUTURE) ×3 IMPLANT
SUT ETHIBOND X763 2 0 SH 1 (SUTURE) ×6 IMPLANT
SUT MNCRL AB 3-0 PS2 18 (SUTURE) ×6 IMPLANT
SUT PDS AB 1 CTX 36 (SUTURE) ×6 IMPLANT
SUT PROLENE 4 0 RB 1 (SUTURE) ×3
SUT PROLENE 4 0 SH DA (SUTURE) ×3 IMPLANT
SUT PROLENE 4-0 RB1 .5 CRCL 36 (SUTURE) IMPLANT
SUT PROLENE 5 0 C 1 36 (SUTURE) ×9 IMPLANT
SUT PROLENE 7 0 BV 1 (SUTURE) IMPLANT
SUT PROLENE 7 0 BV1 MDA (SUTURE) ×3 IMPLANT
SUT STEEL 6MS V (SUTURE) ×6 IMPLANT
SUT STEEL STERNAL CCS#1 18IN (SUTURE) IMPLANT
SUT VIC AB 2-0 CT1 27 (SUTURE) ×3
SUT VIC AB 2-0 CT1 36 (SUTURE) IMPLANT
SUT VIC AB 2-0 CT1 TAPERPNT 27 (SUTURE) IMPLANT
SUT VIC AB 3-0 X1 27 (SUTURE) IMPLANT
SUT VICRYL 3-0 27 CRC X-1 (SUTURE) IMPLANT
SYSTEM SAHARA CHEST DRAIN ATS (WOUND CARE) ×3 IMPLANT
TAPE CLOTH SURG 4X10 WHT LF (GAUZE/BANDAGES/DRESSINGS) IMPLANT
TAPE PAPER 2X10 WHT MICROPORE (GAUZE/BANDAGES/DRESSINGS) IMPLANT
TOWEL GREEN STERILE (TOWEL DISPOSABLE) ×6 IMPLANT
TOWEL GREEN STERILE FF (TOWEL DISPOSABLE) ×3 IMPLANT
TRAY FOLEY SLVR 16FR TEMP STAT (SET/KITS/TRAYS/PACK) ×3 IMPLANT
TUBING LAP HI FLOW INSUFFLATIO (TUBING) ×3 IMPLANT
UNDERPAD 30X36 HEAVY ABSORB (UNDERPADS AND DIAPERS) ×6 IMPLANT
WATER STERILE IRR 1000ML POUR (IV SOLUTION) ×6 IMPLANT

## 2022-07-23 NOTE — Consult Note (Signed)
NAME:  Luis Castillo, MRN:  [id number], DOB:  12-11-1960, LOS: 0 ADMISSION DATE:  07/23/2022, CONSULTATION DATE:  07/23/22 REFERRING MD:  Kipp Brood - CVTS, CHIEF COMPLAINT:  s/p CABG    History of Present Illness:  62 yo m PMH multivessel CAD, HTN, HLD, DM presented to hospital 07/23/22 for planned CABG x4, R radial artery harvest & L greater saphenous vein harvest.  Operative course uncomplicated  EBL: 123456 cc Received: 650 cell saver cc Xclamp: 52 min  Total pump:109 min  Postoperatively he is admitted to the ICU as per preoperative plan. PCCM is consulted in this setting   Pertinent  Medical History  CAD HTN hLD DM  Significant Hospital Events: Including procedures, antibiotic start and stop dates in addition to other pertinent events   2/21 CABG x 4 ICU admission post op   Interim History / Subjective:  POD 0 CABG x 4 EBL 951 Received 650 cell saver   HDS not on pressors on arrival to ICU   Objective   Blood pressure (!) 162/96, pulse (!) 56, temperature 98 F (36.7 C), temperature source Oral, resp. rate 16, height 6' 0.5" (1.842 m), weight 96.6 kg, SpO2 97 %.        Intake/Output Summary (Last 24 hours) at 07/23/2022 1459 Last data filed at 07/23/2022 1437 Gross per 24 hour  Intake 3500 ml  Output 1951 ml  Net 1549 ml   Filed Weights   07/23/22 0701  Weight: 96.6 kg    Examination: General: Critically ill wdwn middle aged M intubated sedated  HENT: NCAT pink mm anicteric sclera. ETT taped  Lungs: Some coarse inspiratory sounds. Mechanically ventilated. Symmetrical chest expansion  Cardiovascular: cap refill brisk. Chest tube w bloody output. Pacing wires   Abdomen: soft ndnt  Extremities: RLE wrapped, LUE wrapped. No acute joint deformity  Neuro: sedated. 16m pupils  GU: foley clear yellow urine   Resolved Hospital Problem list     Assessment & Plan:   Multivessel 3vCAD s/p CABG (L radial artery harvest, R greater saphenous vein  harvest) Endotracheally intubated // planned post op MV  HTN HLD DM 2 -uncomplicated op course, notably L radial artery was harvested as well as R greater saphenous vein)  P -chest tube, pacing wires per CVTS -CXR ABG -VAP, pulm hygiene -wean MV per protocol -- hopefully rapid wean -will add BD  -complete txa, ppx abx -insulin gtt  -on nitro gtt -cardene gtt, eventually amlodipine  -precedex + multimodal analgesia -ASA     Best Practice (right click and "Reselect all SmartList Selections" daily)   Diet/type: NPO DVT prophylaxis: not indicated GI prophylaxis: H2B and PPI Lines: Central line and Arterial Line Foley:  Yes, and it is still needed Code Status:  full code Last date of multidisciplinary goals of care discussion [--]  Labs   CBC: Recent Labs  Lab 07/22/22 1339 07/23/22 0927 07/23/22 1233 07/23/22 1240 07/23/22 1311 07/23/22 1333 07/23/22 1356  WBC 8.2  --   --   --   --   --   --   HGB 16.9   < > 11.2* 12.0* 10.2* 10.5* 10.2*  HCT 47.6   < > 33.0* 32.8* 30.0* 31.0* 30.0*  MCV 87.5  --   --   --   --   --   --   PLT 189  --   --  129*  --   --   --    < > = values in this interval  not displayed.    Basic Metabolic Panel: Recent Labs  Lab 07/22/22 1339 07/23/22 0927 07/23/22 1123 07/23/22 1158 07/23/22 1203 07/23/22 1233 07/23/22 1311 07/23/22 1333 07/23/22 1356  NA 138 141 142   < > 139 140 139 141 142  K 4.1 3.8 3.8   < > 4.4 3.7 4.0 3.9 3.5  CL 103 102 102  --   --  103  --  102 105  CO2 24  --   --   --   --   --   --   --   --   GLUCOSE 261* 190* 162*  --   --  153*  --  183* 166*  BUN 16 13 12  $ --   --  11  --  10 10  CREATININE 0.88 0.60* 0.60*  --   --  0.50*  --  0.50* 0.60*  CALCIUM 9.1  --   --   --   --   --   --   --   --    < > = values in this interval not displayed.   GFR: Estimated Creatinine Clearance: 116.3 mL/min (A) (by C-G formula based on SCr of 0.6 mg/dL (L)). Recent Labs  Lab 07/22/22 1339  WBC 8.2     Liver Function Tests: Recent Labs  Lab 07/22/22 1339  AST 30  ALT 46*  ALKPHOS 80  BILITOT 1.1  PROT 7.3  ALBUMIN 4.2   No results for input(s): "LIPASE", "AMYLASE" in the last 168 hours. No results for input(s): "AMMONIA" in the last 168 hours.  ABG    Component Value Date/Time   PHART 7.382 07/23/2022 1311   PCO2ART 42.2 07/23/2022 1311   PO2ART 231 (H) 07/23/2022 1311   HCO3 25.1 07/23/2022 1311   TCO2 24 07/23/2022 1356   O2SAT 100 07/23/2022 1311     Coagulation Profile: Recent Labs  Lab 07/22/22 1339  INR 1.1    Cardiac Enzymes: No results for input(s): "CKTOTAL", "CKMB", "CKMBINDEX", "TROPONINI" in the last 168 hours.  HbA1C: Hgb A1c MFr Bld  Date/Time Value Ref Range Status  07/22/2022 01:39 PM 7.6 (H) 4.8 - 5.6 % Final    Comment:    (NOTE) Pre diabetes:          5.7%-6.4%  Diabetes:              >6.4%  Glycemic control for   <7.0% adults with diabetes     CBG: Recent Labs  Lab 07/22/22 1257 07/23/22 0719  GLUCAP 295* 180*    Review of Systems:   Unable to obtain, intubated sedated   Past Medical History:  He,  has a past medical history of Arthritis, Complication of anesthesia, Coronary artery disease, Diabetes mellitus without complication ([place]), Headache, History of blood transfusion, and Hypertension.   Surgical History:   Past Surgical History:  Procedure Laterality Date   BACK SURGERY  2006   COLONOSCOPY WITH PROPOFOL N/A 11/10/2016   Procedure: COLONOSCOPY WITH PROPOFOL;  Surgeon: Garlan Fair, MD;  Location: [facility];  Service: Endoscopy;  Laterality: N/A;   KNEE CARTILAGE SURGERY  2015   Both knees done.   LEFT HEART CATH AND CORONARY ANGIOGRAPHY N/A 07/10/2022   Procedure: LEFT HEART CATH AND CORONARY ANGIOGRAPHY;  Surgeon: Lorretta Harp, MD;  Location: [facility];  Service: Cardiovascular;  Laterality: N/A;     Social History:   reports that he has never smoked. He has never used smokeless  tobacco. He reports current alcohol use of about 2.0 standard drinks of alcohol per week. He reports that he does not use drugs.   Family History:  His family history includes Cancer in his father; Heart attack in his sister.   Allergies No Known Allergies   Home Medications  Prior to Admission medications   Medication Sig Start Date End Date Taking? Authorizing Provider  amLODipine-benazepril (LOTREL) 10-20 MG capsule Take 1 capsule by mouth daily. 04/14/11  Yes [provider]  aspirin EC 81 MG tablet Take 81 mg by mouth daily.   Yes [provider]  atorvastatin (LIPITOR) 40 MG tablet Take 40 mg by mouth daily. 05/05/22  Yes [provider]  empagliflozin (JARDIANCE) 25 MG TABS tablet Take 25 mg by mouth daily. 01/05/18  Yes [provider]  glimepiride (AMARYL) 2 MG tablet Take 2 mg by mouth every morning. 04/17/22  Yes [provider]  hydrochlorothiazide (HYDRODIURIL) 25 MG tablet Take 25 mg by mouth daily. 04/14/11  Yes [provider]  metFORMIN (GLUCOPHAGE) 500 MG tablet Take 1,000 mg by mouth 2 (two) times daily with a meal.   Yes [provider]  meloxicam (MOBIC) 15 MG tablet Take 1 tablet daily with food for 10 days. Then take as needed. Patient not taking: Reported on 07/18/2022 01/18/18   Lilia Argue R, DO  VIAGRA 100 MG tablet Take 100 mg by mouth daily as needed for erectile dysfunction.  03/03/11   [provider]     Critical care time: 40 min      CRITICAL CARE Performed by: Cristal Generous   Total critical care time: 40 minutes  Critical care time was exclusive of separately billable procedures and treating other patients. Critical care was necessary to treat or prevent imminent or life-threatening deterioration.  Critical care was time spent personally by me on the following activities: development of treatment plan with patient and/or surrogate as well as nursing, discussions with  consultants, evaluation of patient's response to treatment, examination of patient, obtaining history from patient or surrogate, ordering and performing treatments and interventions, ordering and review of laboratory studies, ordering and review of radiographic studies, pulse oximetry and re-evaluation of patient's condition.  Eliseo Gum MSN, AGACNP-BC [place] for pager  07/23/2022, 2:59 PM

## 2022-07-23 NOTE — Anesthesia Procedure Notes (Signed)
Arterial Line Insertion Start/End2/21/2024 8:10 AM, 07/23/2022 8:45 AM Performed by: Albertha Ghee, MD, anesthesiologist  Patient location: Pre-op. Preanesthetic checklist: patient identified, IV checked, site marked, risks and benefits discussed, surgical consent, monitors and equipment checked, pre-op evaluation, timeout performed and anesthesia consent Lidocaine 1% used for infiltration Right, radial was placed Catheter size: 20 G Hand hygiene performed  and maximum sterile barriers used   Attempts: 4 Procedure performed using ultrasound guided technique. Ultrasound Notes:anatomy identified, needle tip was noted to be adjacent to the nerve/plexus identified, no ultrasound evidence of intravascular and/or intraneural injection and image(s) printed for medical record Following insertion, dressing applied. Post procedure assessment: normal and unchanged  Post procedure complications: local hematoma. Patient tolerated the procedure well with no immediate complications.

## 2022-07-23 NOTE — Progress Notes (Signed)
[facility] Progress Note: Day of Surgery Procedure(s) (LRB): CORONARY ARTERY BYPASS GRAFTING (CABG) X FOUR USING LEFT INTERNAL MAMMARY ARTERY, LEFT RADIAL ARTERY AND RIGHT GREATER SAPHENOUS VEIN HARVESTED ENDOSCOPICALLY. (N/A) RADIAL ARTERY HARVEST (Left) TRANSESOPHAGEAL ECHOCARDIOGRAM (N/A)  LOS: 0 days    2 amp bicab for bicarb 19. Was 23 on 1st gas.  If >22 bicarb and meets other criteria can extubate  Tube may be preferential to the right, po2 of around 80 is lower than would expect for his physiology  +breath sounds on left side.      Latest Ref Rng & Units 07/23/2022    2:44 PM 07/23/2022    1:56 PM 07/23/2022    1:33 PM  CBC  WBC 4.0 - 10.5 K/uL 15.3     Hemoglobin 13.0 - 17.0 g/dL 12.8  10.2  10.5   Hematocrit 39.0 - 52.0 % 35.8  30.0  31.0   Platelets 150 - 400 K/uL 106          Latest Ref Rng & Units 07/23/2022    1:56 PM 07/23/2022    1:33 PM 07/23/2022    1:11 PM  CMP  Glucose 70 - 99 mg/dL 166  183    BUN 8 - 23 mg/dL 10  10    Creatinine 0.61 - 1.24 mg/dL 0.60  0.50    Sodium 135 - 145 mmol/L 142  141  139   Potassium 3.5 - 5.1 mmol/L 3.5  3.9  4.0   Chloride 98 - 111 mmol/L 105  102      ABG    Component Value Date/Time   PHART 7.382 07/23/2022 1311   PCO2ART 42.2 07/23/2022 1311   PO2ART 231 (H) 07/23/2022 1311   HCO3 25.1 07/23/2022 1311   TCO2 24 07/23/2022 1356   O2SAT 100 07/23/2022 1311    Vent Mode: CPAP;PSV FiO2 (%):  [40 %-50 %] 40 % Set Rate:  [4 bmp-16 bmp] 4 bmp Vt Set:  [620 mL] 620 mL PEEP:  [5 cmH20] 5 cmH20 Pressure Support:  [5 cmH20-10 cmH20] 10 cmH20 Plateau Pressure:  [17 cmH20] 17 cmH20

## 2022-07-23 NOTE — Brief Op Note (Signed)
07/23/2022  7:27 AM  PATIENT:  Luis Castillo  62 y.o. male  PRE-OPERATIVE DIAGNOSIS:  CAD  POST-OPERATIVE DIAGNOSIS:  CAD  PROCEDURE:  Procedure(s): CORONARY ARTERY BYPASS GRAFTING (CABG) X FOUR USING LEFT INTERNAL MAMMARY ARTERY, LEFT RADIAL ARTERY AND RIGHT GREATER SAPHENOUS VEIN HARVESTED ENDOSCOPICALLY. (N/A) RADIAL ARTERY HARVEST (Left) TRANSESOPHAGEAL ECHOCARDIOGRAM (N/A) Vein harvest time: 59mn Vein prep time: 169m Radial harvest time 50 min     prep time 10 min  SURGEON:  Surgeon(s) and Role:    * [place], HaLucile CraterMD - Primary  PHYSICIAN ASSISTANT: Taryn Nave PA-C  ASSISTANTS: RNFA    ANESTHESIA:   general  EBL:  951 mL   BLOOD ADMINISTERED:none  DRAINS:  LEFT PLEURAL AND MEDIASTINAL CHEST TUBES    LOCAL MEDICATIONS USED:  NONE  SPECIMEN:  No Specimen  DISPOSITION OF SPECIMEN:  N/A  COUNTS:  YES  TOURNIQUET:  * No tourniquets in log *  DICTATION: .Dragon Dictation  PLAN OF CARE: Admit to inpatient   PATIENT DISPOSITION:  ICU - intubated and hemodynamically stable.   Delay start of Pharmacological VTE agent (>24hrs) due to surgical blood loss or risk of bleeding: yes  COMPLICATIONS: NO KNOWN

## 2022-07-23 NOTE — Anesthesia Preprocedure Evaluation (Signed)
Anesthesia Evaluation  Patient identified by MRN, date of birth, ID band Patient awake    Reviewed: Allergy & Precautions, H&P , NPO status , Patient's Chart, lab work & pertinent test results  Airway Mallampati: II   Neck ROM: full    Dental   Pulmonary neg pulmonary ROS   breath sounds clear to auscultation       Cardiovascular hypertension, + CAD and + CABG   Rhythm:regular Rate:Normal     Neuro/Psych  Headaches    GI/Hepatic   Endo/Other  diabetes, Type 2    Renal/GU      Musculoskeletal  (+) Arthritis ,    Abdominal   Peds  Hematology   Anesthesia Other Findings   Reproductive/Obstetrics                             Anesthesia Physical Anesthesia Plan  ASA: 3  Anesthesia Plan: General   Post-op Pain Management:    Induction: Intravenous  PONV Risk Score and Plan: 2 and Ondansetron, Dexamethasone, Midazolam and Treatment may vary due to age or medical condition  Airway Management Planned: Oral ETT  Additional Equipment: Arterial line, CVP, PA Cath, TEE and Ultrasound Guidance Line Placement  Intra-op Plan:   Post-operative Plan: Post-operative intubation/ventilation  Informed Consent: I have reviewed the patients History and Physical, chart, labs and discussed the procedure including the risks, benefits and alternatives for the proposed anesthesia with the patient or authorized representative who has indicated his/her understanding and acceptance.     Dental advisory given  Plan Discussed with: CRNA, Anesthesiologist and Surgeon  Anesthesia Plan Comments:        Anesthesia Quick Evaluation

## 2022-07-23 NOTE — Discharge Instructions (Signed)

## 2022-07-23 NOTE — Progress Notes (Signed)
Patient done with rapid heart wean. NIF/VC are within normal limit. ABG was acidotic. MD made aware. RN will call with repeat ABG.

## 2022-07-23 NOTE — Anesthesia Procedure Notes (Signed)
Central Venous Catheter Insertion Performed by: Albertha Ghee, MD, anesthesiologist Start/End2/21/2024 7:55 AM, 07/23/2022 8:12 AM Patient location: Pre-op. Preanesthetic checklist: patient identified, IV checked, site marked, risks and benefits discussed, surgical consent, monitors and equipment checked, pre-op evaluation, timeout performed and anesthesia consent Lidocaine 1% used for infiltration and patient sedated Hand hygiene performed  and maximum sterile barriers used  Catheter size: 8.5 Fr Central line was placed.Sheath introducer Procedure performed using ultrasound guided technique. Ultrasound Notes:anatomy identified, needle tip was noted to be adjacent to the nerve/plexus identified, no ultrasound evidence of intravascular and/or intraneural injection and image(s) printed for medical record Attempts: 1 Following insertion, line sutured and dressing applied. Post procedure assessment: blood return through all ports, free fluid flow and no air  Patient tolerated the procedure well with no immediate complications.

## 2022-07-23 NOTE — Transfer of Care (Signed)
Immediate Anesthesia Transfer of Care Note  Patient: Luis Castillo  Procedure(s) Performed: CORONARY ARTERY BYPASS GRAFTING (CABG) X FOUR USING LEFT INTERNAL MAMMARY ARTERY, LEFT RADIAL ARTERY AND RIGHT GREATER SAPHENOUS VEIN HARVESTED ENDOSCOPICALLY. (Chest) RADIAL ARTERY HARVEST (Left: Arm Lower) TRANSESOPHAGEAL ECHOCARDIOGRAM  Patient Location: ICU  Anesthesia Type:General  Level of Consciousness: unresponsive and Patient remains intubated per anesthesia plan  Airway & Oxygen Therapy: Patient remains intubated per anesthesia plan and Patient placed on Ventilator (see vital sign flow sheet for setting)  Post-op Assessment: Report given to RN and Post -op Vital signs reviewed and stable  Post vital signs: Reviewed and stable  Last Vitals:  Vitals Value Taken Time  BP 93/74 07/23/22 1448  Temp    Pulse 67 07/23/22 1453  Resp 16 07/23/22 1453  SpO2 95 % 07/23/22 1453  Vitals shown include unvalidated device data.  Last Pain:  Vitals:   07/23/22 0701  TempSrc: Oral  PainSc: 0-No pain         Complications: No notable events documented.

## 2022-07-23 NOTE — Discharge Summary (Signed)
[address]       [place] [address]             [phone]    Physician Discharge Summary  Patient ID: Luis Castillo MRN: [id number] DOB/AGE: 08/24/1960 62 y.o.  Admit date: 07/23/2022 Discharge date: 07/28/2022  Admission Diagnoses:  Patient Active Problem List   Diagnosis Date Noted   Endotracheally intubated 07/23/2022   Secondary hypertension 07/23/2022   Abnormal nuclear stress test 07/10/2022   Right knee pain 03/07/2013   Left knee pain 04/22/2011     Discharge Diagnoses:  Patient Active Problem List   Diagnosis Date Noted   S/P CABG x 4 07/23/2022   Endotracheally intubated 07/23/2022   Secondary hypertension 07/23/2022   Abnormal nuclear stress test 07/10/2022   Right knee pain 03/07/2013   Left knee pain 04/22/2011     Discharged Condition: stable  History of Present Illness:      Mr. Luis Castillo is a 62 year old male with past medical history of hypertension. He underwent a coronary calcium CT which showed an elevated calcium level. He subsequently underwent a stress test which was concerning for some ischemic changes. His cardiac catheterization showed severe three vessel disease including left main disease. In regards to his symptoms he continues to exercise regularly, and does about 40 minutes of cardio 5-6 times per week without symptoms. His sister died suddenly at home but there is no strong family history outside of this in regards to coronary disease. He has a preoperative A1C of 7.6.   Dr. Kipp Brood reviewed the patient and all relevant studies and determined surgical intervention would provide this patient the best long term treatment. He discussed the patient's treatment options as well as the risks and benefits of surgery. Mr. Asare was agreeable to proceed with surgery.   Hospital course:  Mr. Sharum arrived at [facility] and was taken the operating room on 07/23/2022. He underwent a CABG x 4 utilizing LIMA to LAD,  Radial artery to PLV, SVG to PDA and SVG to Diagonal. He tolerated the procedure well and was taken to the surgical intensive care unit in stable condition. He was extubated the evening of surgery without complication. His drips were weaned as hemodynamics tolerated. He was started on Amlodipine for his radial artery conduit. He had moderate chest tube drainage, chest tubes were left in place. He was started on Lopressor which was titrated as able. Arterial line was removed without complication. He was volume overload and diuresed appropriately. His epicardial pacing wires and chest tubes were removed without complication on [id number]. He was felt stable for transfer to the progressive unit. He had a brief episode of atrial fibrillation on 02/23. He quickly converted to SR with bolus of IV Amiodarone. He maintained SR on Lopressor and oral Amiodarone. He became more hypertensive so Amlodipine was increased to 5 mg daily and Lopressor was increased to 37.5 mg bid. He was weaned off oxygen and ambulating with good oxygenation on room air. All wounds are clean, dry, and healing without signs of infection. He did have some bleeding from radial artery harvest (near antecubital fossa) but this did stop;motor/sensory was intact, there was minimal pain with movement. He had been tolerating a diet and had a bowel movement. He had continued hypertension so medications were titrated accordingly. As of 02/25, he was on Amlodipine 5 mg daily, Lopressor 37.5 mg bid, and restarted on Benazepril 10 mg daily. He had thrombocytopenia post op but this  did resolve as last platelets were up to 164,000. Hypertension continued after addition of Benazepril so Lopressor was titrated to '50mg'$  BID and Amlodipine to '10mg'$  daily. His expected postoperative blood loss anemia resolved. His radial incision stopped bleeding and all other incisions were healing well without sign of infection. He was ambulating around the unit well. He had ok CBG control,  we discussed close outpatient follow up with his PCP for further diabetes surveillance as his preop A1C was 7.6. He was felt surgically stable for discharge.   Consults:  Critical care  Significant Diagnostic Studies:   DG Chest 2 View  Result Date: 07/27/2022 CLINICAL DATA:  Pleural effusion. EXAM: CHEST - 2 VIEW COMPARISON:  Chest x-rays dated 07/25/2022 and 07/23/2022. FINDINGS: RIGHT IJ Cordis has been removed. Median sternotomy wires appear intact and stable in alignment. Heart size and mediastinal contours are stable. Mild atelectasis at the LEFT lung base and small LEFT pleural effusion. RIGHT lung is clear. No pneumothorax is seen. IMPRESSION: Mild atelectasis at the LEFT lung base and small LEFT pleural effusion. Electronically Signed   By: Franki Cabot M.D.   On: 07/27/2022 08:38   DG Chest 2 View  Result Date: 07/25/2022 CLINICAL DATA:  Pneumothorax EXAM: CHEST - 2 VIEW COMPARISON:  AP chest 07/25/2022 at 5:43 a.m., 07/24/2022 FINDINGS: Right internal jugular central venous catheter sheath with indwelling catheter tip at the superior vena cava/right atrial junction, unchanged from prior. Status post median sternotomy and CABG. Cardiac silhouette is again at the upper limits of normal size for AP technique. Mediastinal contours are within normal limits. Mildly decreased lung volumes. Left midlung platelike atelectasis is similar to prior. Small left pleural effusion with mild left basilar retrocardiac opacity, unchanged. No pneumothorax is seen. No acute skeletal abnormality. IMPRESSION: 1. No pneumothorax is seen. 2. Small left pleural effusion, similar to prior. 3. Left mid and lower lung subsegmental atelectasis, similar to prior. Electronically Signed   By: Yvonne Kendall M.D.   On: 07/25/2022 13:18   DG Chest Port 1 View  Result Date: 07/25/2022 CLINICAL DATA:  Pleural effusion EXAM: PORTABLE CHEST 1 VIEW COMPARISON:  07/24/2022 FINDINGS: Right IJ central venous catheter remains in  place. Stable cardiomediastinal contours status post sternotomy and CABG. Left greater than right bibasilar atelectasis with improving aeration from prior. No significant pleural fluid collection. No pneumothorax. IMPRESSION: Left greater than right bibasilar atelectasis with improving aeration from prior. Electronically Signed   By: Davina Poke D.O.   On: 07/25/2022 08:22   ECHO INTRAOPERATIVE TEE  Result Date: 07/24/2022  *INTRAOPERATIVE TRANSESOPHAGEAL REPORT *  Patient Name:   Luis Castillo Date of Exam: 07/23/2022 Medical Rec #:  [id number]       Height:       72.5 in Accession #:    [id number]      Weight:       213.0 lb Date of Birth:  1960/07/28      BSA:          2.20 m Patient Age:    28 years        BP:           162/96 mmHg Patient Gender: M               HR:           60 bpm. Exam Location:  Anesthesiology Transesophogeal exam was perform intraoperatively during surgical procedure. Patient was closely monitored under general anesthesia during the entirety of examination. Indications:  CAD Native Vessel i25.10 Sonographer:     Raquel Sarna Senior RDCS Performing Phys: [id number] Lucile Crater LIGHTFOOT Diagnosing Phys: Albertha Ghee MD Complications: No known complications during this procedure. POST-OP IMPRESSIONS Overall, there were no significant changes from pre-bypass. PRE-OP FINDINGS  Left Ventricle: The left ventricle has normal systolic function, with an ejection fraction of 55-60%. The cavity size was normal. There is mildly increased left ventricular wall thickness. There is mild concentric left ventricular hypertrophy. Right Ventricle: The right ventricle has normal systolic function. The cavity was normal. There is no increase in right ventricular wall thickness. Left Atrium: Left atrial size was normal in size. No left atrial/left atrial appendage thrombus was detected. Right Atrium: Right atrial size was normal in size. Interatrial Septum: No atrial level shunt detected by color flow  Doppler. Pericardium: There is no evidence of pericardial effusion. Mitral Valve: The mitral valve is normal in structure. Mitral valve regurgitation is trivial by color flow Doppler. There is No evidence of mitral stenosis. Tricuspid Valve: The tricuspid valve was normal in structure. Tricuspid valve regurgitation was not visualized by color flow Doppler. Aortic Valve: The aortic valve is normal in structure. Aortic valve regurgitation was not visualized by color flow Doppler. There is no stenosis of the aortic valve. Pulmonic Valve: The pulmonic valve was normal in structure. Pulmonic valve regurgitation is mild by color flow Doppler. Aorta: The aortic root, ascending aorta and aortic arch are normal in size and structure.  Albertha Ghee MD Electronically signed by Albertha Ghee MD Signature Date/Time: 07/24/2022/7:58:54 PM    Final    DG Chest Port 1 View  Result Date: 07/24/2022 CLINICAL DATA:  Status post CABG. EXAM: PORTABLE CHEST 1 VIEW COMPARISON:  Chest x-ray from yesterday. FINDINGS: Interval removal of the endotracheal and enteric tubes. Unchanged right internal jugular central venous catheter and mediastinal drain. Stable cardiomediastinal silhouette status post CABG. Continued low lung volumes. Persistent but mildly improved left basilar atelectasis. No pneumothorax or large pleural effusion. No acute osseous abnormality. IMPRESSION: 1. Interval extubation. Persistent but mildly improved left basilar atelectasis. Electronically Signed   By: Titus Dubin M.D.   On: 07/24/2022 08:35   DG Chest Port 1 View  Result Date: 07/23/2022 CLINICAL DATA:  Sixty-two year status post coronary graft EXAM: PORTABLE CHEST - 1 VIEW COMPARISON:  07/22/2018 FINDINGS: The mediastinal contours are within normal limits. No cardiomegaly. Interval intubation with endotracheal tip approximally 1 cm above the carina. Right internal jugular central venous catheter in place with the catheter tip in the superior vena cava.  Gastric decompression tube in place terminating off the inferior aspect of this image. Trace pneumomediastinum. Low lung volumes with bibasilar subsegmental atelectasis. No evidence of significant pleural effusion, focal consolidation, or pneumothorax. Median sternotomy wires in place. IMPRESSION: 1. Interval intubation with endotracheal tip approximally 1 cm above the carina. Recommend retraction by ~2 cm. 2. Low lung volumes with associated bibasilar subsegmental atelectasis. Electronically Signed   By: Ruthann Cancer M.D.   On: 07/23/2022 15:08   EP STUDY  Result Date: 07/23/2022 See surgical note for result.  DG Chest 2 View  Result Date: 07/23/2022 CLINICAL DATA:  Patient planned for multivessel CABG.  [id number]. EXAM: CHEST - 2 VIEW COMPARISON:  Partial chest CT for coronary calcium scoring dated 05/05/2022. FINDINGS: The heart size and mediastinal contours are within normal limits. Both lungs are clear. The visualized skeletal structures are intact, with moderate thoracic spondylosis. IMPRESSION: No acute radiographic chest findings. Electronically Signed   By: Lanny Hurst  Chesser M.D.   On: 07/23/2022 06:12   VAS US DOPPLER PRE CABG  Result Date: 07/22/2022 PREOPERATIVE VASCULAR EVALUATION Patient Name:  HANCE RENSEL  Date of Exam:   07/22/2022 Medical Rec #: [id number]        Accession #:    [id number] Date of Birth: 10/14/1960       Patient Gender: M Patient Age:   55 years Exam Location:  [facility] Procedure:      VAS US DOPPLER PRE CABG Referring Phys: HARRELL LIGHTFOOT --------------------------------------------------------------------------------  Indications:      Pre-CABG. Risk Factors:     Hypertension, Diabetes, coronary artery disease. Comparison Study: No previous exams Performing Technologist: Rogelia Rohrer RVT/RDMS  Examination Guidelines: A complete evaluation includes B-mode imaging, spectral Doppler, color Doppler, and power Doppler as needed of all accessible portions of each  vessel. Bilateral testing is considered an integral part of a complete examination. Limited examinations for reoccurring indications may be performed as noted.  Right Carotid Findings: +----------+--------+--------+--------+--------+------------------+           PSV cm/sEDV cm/sStenosisDescribeComments           +----------+--------+--------+--------+--------+------------------+ CCA Prox  82      19                      intimal thickening +----------+--------+--------+--------+--------+------------------+ CCA Distal87      22                                         +----------+--------+--------+--------+--------+------------------+ ICA Prox  49      12                                         +----------+--------+--------+--------+--------+------------------+ ICA Distal62      20                                         +----------+--------+--------+--------+--------+------------------+ ECA       93      13                                         +----------+--------+--------+--------+--------+------------------+ +----------+--------+-------+--------+------------+           PSV cm/sEDV cmsDescribeArm Pressure +----------+--------+-------+--------+------------+ Subclavian73                                  +----------+--------+-------+--------+------------+ +---------+--------+--+--------+-+ VertebralPSV cm/s26EDV cm/s7 +---------+--------+--+--------+-+ Left Carotid Findings: +----------+--------+--------+--------+------------+------------------+           PSV cm/sEDV cm/sStenosisDescribe    Comments           +----------+--------+--------+--------+------------+------------------+ CCA Prox  108     23                          intimal thickening +----------+--------+--------+--------+------------+------------------+ CCA Distal80      23                          intimal thickening  +----------+--------+--------+--------+------------+------------------+ ICA Prox  52      23              heterogenous                   +----------+--------+--------+--------+------------+------------------+ ICA Distal58      25                                             +----------+--------+--------+--------+------------+------------------+ ECA       103     12                                             +----------+--------+--------+--------+------------+------------------+ +----------+--------+--------+--------+------------+ SubclavianPSV cm/sEDV cm/sDescribeArm Pressure +----------+--------+--------+--------+------------+           98                                   +----------+--------+--------+--------+------------+ +---------+--------+--+--------+--+ VertebralPSV cm/s30EDV cm/s12 +---------+--------+--+--------+--+  ABI Findings: +---------+------------------+-----+---------+--------+ Right    Rt Pressure (mmHg)IndexWaveform Comment  +---------+------------------+-----+---------+--------+ Brachial 131                    triphasic         +---------+------------------+-----+---------+--------+ PTA      166               1.24 triphasic         +---------+------------------+-----+---------+--------+ PERO     150               1.12 triphasic         +---------+------------------+-----+---------+--------+ Great Toe100               0.75 Normal            +---------+------------------+-----+---------+--------+ +---------+------------------+-----+---------+-------+ Left     Lt Pressure (mmHg)IndexWaveform Comment +---------+------------------+-----+---------+-------+ Brachial 134                    triphasic        +---------+------------------+-----+---------+-------+ PTA      167               1.25 triphasic        +---------+------------------+-----+---------+-------+ PERO     160               1.19 triphasic         +---------+------------------+-----+---------+-------+ Great Toe104               0.78 Normal           +---------+------------------+-----+---------+-------+ +-------+---------------+----------------+ ABI/TBIToday's ABI/TBIPrevious ABI/TBI +-------+---------------+----------------+ Right  1.24 / 0.75                     +-------+---------------+----------------+ Left   1.25 / 0.78                     +-------+---------------+----------------+  Right Doppler Findings: +--------+--------+-----+---------+--------+ Site    PressureIndexDoppler  Comments +--------+--------+-----+---------+--------+ [id number]          triphasic         +--------+--------+-----+---------+--------+ Radial               triphasic         +--------+--------+-----+---------+--------+ Ulnar  triphasic         +--------+--------+-----+---------+--------+  Left Doppler Findings: +--------+--------+-----+---------+--------+ Site    PressureIndexDoppler  Comments +--------+--------+-----+---------+--------+ [id number]          triphasic         +--------+--------+-----+---------+--------+ Radial               triphasic         +--------+--------+-----+---------+--------+ Ulnar                triphasic         +--------+--------+-----+---------+--------+   Summary: Right Carotid: The extracranial vessels were near-normal with only minimal wall                thickening or plaque. Left Carotid: The extracranial vessels were near-normal with only minimal wall               thickening or plaque. Vertebrals:  Bilateral vertebral arteries demonstrate antegrade flow. Subclavians: Normal flow hemodynamics were seen in bilateral subclavian              arteries. Right ABI: Resting right ankle-brachial index is within normal range. The right toe-brachial index is normal. Left ABI: Resting left ankle-brachial index is within normal range. The left toe-brachial index is normal.  Bilateral Extremity: Doppler waveforms remain within normal limits with compression bilaterally for the radial arteries. Doppler waveforms remain within normal limits with compression bilaterally for the ulnar arteries.  Electronically signed by Deitra Mayo MD on 07/22/2022 at 6:01:53 PM.    Final    CARDIAC CATHETERIZATION  Result Date: 07/10/2022 Images from the original result were not included.   RPDA lesion is 95% stenosed.   1st RPL lesion is 99% stenosed.   RPAV lesion is 90% stenosed.   Ost LM to Mid LM lesion is 60% stenosed.   Ost LAD to Prox LAD lesion is 60% stenosed.   Mid LAD lesion is 95% stenosed.   2nd Diag lesion is 95% stenosed.   Dist LAD lesion is 95% stenosed.   Ost Cx to Prox Cx lesion is 90% stenosed.   Ramus lesion is 99% stenosed. Luis Castillo is a 62 y.o. male  [id number] LOCATION:  FACILITY: [facility] PHYSICIAN: Quay Burow, M.D. 1960/10/09 DATE OF PROCEDURE:  07/10/2022 DATE OF DISCHARGE: CARDIAC CATHETERIZATION History obtained from chart review.  62 year old married Caucasian male patient of Dr. Kathalene Frames with respecters include type 2 diabetes, hyperlipidemia and potentially family history.  He is very active and exercises without limitation.  His sister apparently died suddenly recently and he sought medical evaluation.  840 calcium score was measured at 3000, 2D echo was normal and a myocardial perfusion study suggested prior infarction.  Based on this he was referred for outpatient radial diagnostic coronary angiography to define his anatomy.   Mr. Dervisevic has left main/three-vessel disease.  All of his vessels are extremely calcified.  He is asymptomatic but based on the fact that his sister recently died suddenly brings up the question whether this was ischemically mediated.  He is very active and exercises frequently without symptoms.  Based on his anatomy I favor complete revascularization.  He is a diabetic however using arterial conduits would be optimal given his age.   The sheath was removed and a TR band was placed on the right wrist to achieve patent hemostasis.  The patient left the lab in stable condition.  He will be discharged home early this afternoon.  A consult will be sent in to [facility] who  hopefully will see him early next week and arrange bypass surgery soon thereafter. Quay Burow. MD, [facility] 07/10/2022 10:43 AM    MYOCARDIAL PERFUSION IMAGING  Result Date: 07/02/2022   Findings are consistent with infarction with peri-infarct ischemia. The study is intermediate risk.   No ST deviation was noted.   LV perfusion is abnormal. There is evidence of ischemia. Defect 1: There is a medium defect with moderate reduction in uptake present in the apical to mid anterior and apex location(s) that is partially reversible. There is abnormal wall motion in the defect area. Consistent with infarction and peri-infarct ischemia. Defect 2: There is a medium defect with moderate reduction in uptake present in the apical to basal inferior location(s) that is fixed. There is normal wall motion in the defect area.   Left ventricular function is abnormal. Global function is mildly reduced. There were multiple regional abnormalities. Nuclear stress EF: 47 %. The left ventricular ejection fraction is mildly decreased (45-54%). End diastolic cavity size is mildly enlarged. End systolic cavity size is normal.   Prior study not available for comparison. Two defects: first is fixed defect in inferior wall, with normal wall motion. This suggests there may be artifact, but cannot exclude infarct. Second defect is mid to apical anterior wall and wrap around apex. This worsens with stress, concerning for infarct with peri-infarct ischemia. There is mildly abnormal wall motion in this area.     Treatments: surgery: 07/23/2022 Patient:  Luis Castillo Pre-Op Dx: 3V CAD HTN HLP DM   Post-op Dx:  same Procedure: CABG X 4. LIMA LAD, L radial to PLV (Ygraft off vein), RSVG PDA, 1st diagonal    Endoscopic greater saphenous vein harvest on the right Open left radial artery harvest  Discharge Exam: Blood pressure 119/81, pulse 94, temperature 98.5 F (36.9 C), temperature source Oral, resp. rate 18, height 6' 0.5" (1.842 m), weight 93.1 kg, SpO2 98 %. General appearance: alert, cooperative, and no distress Neurologic: intact Heart: regular rate and rhythm, no murmur Lungs: Slightly diminished bibasilar breath sounds, otherwise clear to auscultation Abdomen: soft, non-tender; bowel sounds normal; no masses,  no organomegaly Extremities: extremities normal, atraumatic, no cyanosis or edema Wound: Clean and dry, no erythema or sign of infection. Slight inflammation at superior portion of sternal incision, no erythema or drainage.   Discharge Medications:  The patient has been discharged on:   1.Beta Blocker:  Yes [  X ]                              No   [   ]                              If No, reason:  2.Ace Inhibitor/ARB: Yes [  X ]                                     No  [    ]                                     If No, reason:  3.Statin:   Yes [ X ]  No  [   ]                  If No, reason:  4.Ecasa:  Yes  [ X ]                  No   [   ]                  If No, reason:  Patient had ACS upon admission: No  Plavix/P2Y12 inhibitor: Yes [   ]                                      No  [  X ]     Discharge Instructions     Amb Referral to Cardiac Rehabilitation   Complete by: As directed    [place]   Diagnosis: CABG   CABG X ___: 4   After initial evaluation and assessments completed: Virtual Based Care may be provided alone or in conjunction with Phase 2 Cardiac Rehab based on patient barriers.: Yes   Intensive Cardiac Rehabilitation (ICR) [place] location only OR Traditional Cardiac Rehabilitation (TCR) *If criteria for ICR are not met will enroll in TCR [facility] only): Yes      Allergies as of 07/28/2022   No Known Allergies       Medication List     STOP taking these medications    amLODipine-benazepril 10-20 MG capsule Commonly known as: LOTREL   hydrochlorothiazide 25 MG tablet Commonly known as: HYDRODIURIL   meloxicam 15 MG tablet Commonly known as: MOBIC       TAKE these medications    amiodarone 200 MG tablet Commonly known as: PACERONE Take 2 tabs twice per day for 7 days, then take 1 tab twice per day for 7 days, then take 1 tab once per day thereafter   amLODipine 10 MG tablet Commonly known as: NORVASC Take 1 tablet (10 mg total) by mouth daily.   aspirin EC 325 MG tablet Take 1 tablet (325 mg total) by mouth daily. What changed:  medication strength how much to take   atorvastatin 40 MG tablet Commonly known as: LIPITOR Take 40 mg by mouth daily.   benazepril 10 MG tablet Commonly known as: LOTENSIN Take 1 tablet (10 mg total) by mouth daily.   empagliflozin 25 MG Tabs tablet Commonly known as: JARDIANCE Take 25 mg by mouth daily.   glimepiride 2 MG tablet Commonly known as: AMARYL Take 2 mg by mouth every morning.   metFORMIN 500 MG tablet Commonly known as: GLUCOPHAGE Take 1,000 mg by mouth 2 (two) times daily with a meal.   metoprolol tartrate 50 MG tablet Commonly known as: LOPRESSOR Take 1 tablet (50 mg total) by mouth 2 (two) times daily.   oxyCODONE 5 MG immediate release tablet Commonly known as: Oxy IR/ROXICODONE Take 1 tablet (5 mg total) by mouth every 6 (six) hours as needed for severe pain.   Viagra 100 MG tablet Generic drug: sildenafil Take 100 mg by mouth daily as needed for erectile dysfunction.        Follow-up Information     Lajuana Matte, MD Follow up on 08/01/2022.   Specialty: Cardiothoracic Surgery Why: Virtual follow up at 2:20PM. Please do NOT come to the office as this is a VIRTUAL appointment, Dr. Kipp Brood will call you. Contact information: [place]  [address] [place] [address] [phone]          Deberah Pelton, NP Follow up on 08/13/2022.   Specialty: Cardiology Why: Cardiology follow up at 10:05AM Contact information: [place] [address] [phone]         Wenda Low, MD. Schedule an appointment as soon as possible for a visit.   Specialty: Internal Medicine Why: For diabetes surveillance. Preop A1C 7.6 Contact information: [address] [place] [address] [phone]                 Signed:  Magdalene River, PA-C  07/28/2022, 2:40 PM

## 2022-07-23 NOTE — Procedures (Signed)
Extubation Procedure Note  Patient Details:   Name: Luis Castillo DOB: 1960-08-30 MRN: [id number]   Airway Documentation:  Airway (Active)     Airway 8 mm (Active)  Secured at (cm) 24 cm 07/23/22 1500  Measured From Lips 07/23/22 1500  Secured Location Right 07/23/22 1500  Secured By [employer] Tape 07/23/22 1500  Tube Holder Repositioned Yes 07/23/22 1450  Prone position No 07/23/22 1500  Site Condition Cool;Dry 07/23/22 1500   Vent end date: (not recorded) Vent end time: (not recorded)   Evaluation  O2 sats: stable throughout Complications: No apparent complications Patient did tolerate procedure well. Bilateral Breath Sounds: Clear, Diminished   Yes Pt extubated per protocol without complications and no strider.  Second Abg came normal. NIF: -30 vc of 1.2l. pt currently resting on 4l [place] with stable vitals. Will continue to monitor.   [name]  [name] 07/23/2022, 7:38 PM

## 2022-07-23 NOTE — Op Note (Signed)
[address]       [place] [address]             [phone]                                          07/23/2022 Patient:  Luis Castillo Pre-Op Dx: 3V CAD HTN HLP DM   Post-op Dx:  same Procedure: CABG X 4.  LIMA LAD, L radial to PLV (Ygraft off vein), RSVG PDA, 1st diagonal   Endoscopic greater saphenous vein harvest on the right Open left radial artery harvest   Surgeon and Role:      * Aston Lawhorn, Lucile Crater, MD - Primary    Evonnie Pat , PA-C - assisting An experienced assistant was required given the complexity of this surgery and the standard of surgical care. The assistant was needed for exposure, dissection, suctioning, retraction of delicate tissues and sutures, instrument exchange and for overall help during this procedure.    Anesthesia  general EBL:  1040m Blood Administration: none Xclamp Time:  52 min Pump Time:  1046m  Drains: 1944 blake drain: L, mediastinal  Wires: ventricular Counts: correct   Indications: [age]ear old male with three-vessel coronary artery disease.  His echocardiogram demonstrates preserved biventricular function and no significant valvular disease.  He does have mild dilation of his ascending aorta 4.1 cm on echo.  Personally reviewed his left heart cath, and he has diffusely diseased vessels.  There is a good target on the LAD, diagonal, obtuse marginal, PLV, and possibly the PDA.  We discussed the risk and benefits of surgical revascularization with the use of the left radial artery.  He is agreeable to proceed.   Findings: Small LIMA, good vein, small radial.  Good LAD, good diag, large PLV, moderate sized PDA.  No good targets on the lateral wall  Operative Technique: All invasive lines were placed in pre-op holding.  After the risks, benefits and alternatives were thoroughly discussed, the patient was brought to the operative theatre.  Anesthesia was induced, and the patient was prepped and draped in normal  sterile fashion.  An appropriate surgical pause was performed, and pre-operative antibiotics were dosed accordingly.  We began with simultaneous incisions along the right leg for harvesting of the greater saphenous vein, the left arm for the radial and the chest for the sternotomy.  In regards to the sternotomy, this was carried down with bovie cautery, and the sternum was divided with a reciprocating saw.  Meticulous hemostasis was obtained.  The left internal thoracic artery was exposed and harvested in in pedicled fashion.  The patient was systemically heparinized, and the artery was divided distally, and placed in a papaverine sponge.    The sternal elevator was removed, and a retractor was placed.  The pericardium was divided in the midline and fashioned into a cradle with pericardial stitches.   After we confirmed an appropriate ACT, the ascending aorta was cannulated in standard fashion.  The right atrial appendage was used for venous cannulation site.  Cardiopulmonary bypass was initiated, and the heart retractor was placed. The cross clamp was applied, and a dose of anterograde cardioplegia was given with good arrest of the heart.  We moved to the posterior wall of the heart, and found a good target on the PDA.  An arteriotomy was made, and the vein graft  was anastomosed to it in an end to side fashion.  Next we exposed the lateral wall, and found a good target on the PLV.  An end to side anastomosis with the radial artery graft was then created.  Next, we exposed the anterior wall of the heart and identified a good target on diagonal.   An arteriotomy was created.  The vein was anastomosed in an end to side fashion.  Finally, we exposed a good target on the  LAD, and fashioned an end to side anastomosis between it and the LITA.  We began to re-warm, and a re-animation dose of cardioplegia was given.  The heart was de-aired, and the cross clamp was removed.  Meticulous hemostasis was obtained.    A  partial occludding clamp was then placed on the ascending aorta, and we created an end to side anastomosis between it and the proximal vein grafts.  Rings were placed on the proximal anastomosis.  The radial artery was jumped off the PDA vein graft.  Hemostasis was obtained, and we separated from cardiopulmonary bypass without event.  The heparin was reversed with protamine.  Chest tubes and wires were placed, and the sternum was re-approximated with sternal wires.  The soft tissue and skin were re-approximated wth absorbable suture.    The patient tolerated the procedure without any immediate complications, and was transferred to the ICU in guarded condition.  Myeisha Kruser Bary Leriche

## 2022-07-23 NOTE — Anesthesia Procedure Notes (Signed)
Procedure Name: Intubation Date/Time: 07/23/2022 9:07 AM  Performed by: Heide Scales, CRNAPre-anesthesia Checklist: Patient identified, Emergency Drugs available, Suction available and Patient being monitored Patient Re-evaluated:Patient Re-evaluated prior to induction Oxygen Delivery Method: Circle system utilized Preoxygenation: Pre-oxygenation with 100% oxygen Induction Type: IV induction and Cricoid Pressure applied Ventilation: Mask ventilation without difficulty Laryngoscope Size: Mac and 4 Grade View: Grade I Tube type: Oral Tube size: 8.0 mm Number of attempts: 1 Airway Equipment and Method: Stylet and Oral airway Placement Confirmation: ETT inserted through vocal cords under direct vision, positive ETCO2 and breath sounds checked- equal and bilateral Secured at: 22 cm Tube secured with: Tape Dental Injury: Teeth and Oropharynx as per pre-operative assessment

## 2022-07-23 NOTE — Interval H&P Note (Signed)
History and Physical Interval Note:  07/23/2022 8:37 AM  Luis Castillo  has presented today for surgery, with the diagnosis of CAD.  The various methods of treatment have been discussed with the patient and family. After consideration of risks, benefits and other options for treatment, the patient has consented to  Procedure(s): CORONARY ARTERY BYPASS GRAFTING (CABG) (N/A) RADIAL ARTERY HARVEST (Left) TRANSESOPHAGEAL ECHOCARDIOGRAM (N/A) as a surgical intervention.  The patient's history has been reviewed, patient examined, no change in status, stable for surgery.  I have reviewed the patient's chart and labs.  Questions were answered to the patient's satisfaction.     Basma Buchner Bary Leriche

## 2022-07-23 NOTE — Hospital Course (Addendum)
History of Present Illness:      Luis Castillo is a 62 year old male with past medical history of hypertension. He underwent a coronary calcium CT which showed an elevated calcium level. He subsequently underwent a stress test which was concerning for some ischemic changes. His cardiac catheterization showed severe three vessel disease including left main disease. In regards to his symptoms he continues to exercise regularly, and does about 40 minutes of cardio 5-6 times per week without symptoms. His sister died suddenly at home but there is no strong family history outside of this in regards to coronary disease. He has a preoperative A1C of 7.6.   Dr. Kipp Brood reviewed the patient and all relevant studies and determined surgical intervention would provide this patient the best long term treatment. He discussed the patient's treatment options as well as the risks and benefits of surgery. Luis Castillo was agreeable to proceed with surgery.   Hospital course:  [name] arrived at [facility] and was taken the operating room on 07/23/2022. He underwent a CABG x 4 utilizing LIMA to LAD, Radial artery to PLV, SVG to PDA and SVG to Diagonal. He tolerated the procedure well and was taken to the surgical intensive care unit in stable condition. He was extubated the evening of surgery without complication. His drips were weaned as hemodynamics tolerated. He was started on Amlodipine for his radial artery conduit. He had moderate chest tube drainage, chest tubes were left in place. He was started on Lopressor which was titrated as able. Arterial line was removed without complication. He was volume overload and diuresed appropriately. His epicardial pacing wires and chest tubes were removed without complication on [id number]. He was felt stable for transfer to the progressive unit. He had a brief episode of atrial fibrillation on 02/23. He quickly converted to SR with bolus of IV Amiodarone. He maintained SR on Lopressor  and oral Amiodarone. He became more hypertensive so Amlodipine was increased to 5 mg daily and Lopressor was increased to 37.5 mg bid. He was weaned off oxygen and ambulating with good oxygenation on room air. All wounds are clean, dry, and healing without signs of infection. He did have some bleeding from radial artery harvest (near antecubital fossa) but this did stop;motor/sensory was intact, there was minimal pain with movement. He had been tolerating a diet and had a bowel movement. He had continued hypertension so medications were titrated accordingly. As of 02/25, he was on Amlodipine 5 mg daily, Lopressor 37.5 mg bid, and restarted on Benazepril 10 mg daily. He had thrombocytopenia post op but this did resolve as last platelets were up to 164,000. Hypertension continued after addition of Benazepril so Lopressor was titrated to '50mg'$  BID and Amlodipine to '10mg'$  daily. His expected postoperative blood loss anemia resolved. His radial incision stopped bleeding and all other incisions were healing well without sign of infection. He was ambulating around the unit well. He had ok CBG control, we discussed close outpatient follow up with his PCP for further diabetes surveillance as his preop A1C was 7.6. He was felt surgically stable for discharge.

## 2022-07-24 ENCOUNTER — Encounter (HOSPITAL_COMMUNITY): Payer: Self-pay | Admitting: Thoracic Surgery (Cardiothoracic Vascular Surgery)

## 2022-07-24 ENCOUNTER — Inpatient Hospital Stay (HOSPITAL_COMMUNITY): Payer: 59

## 2022-07-24 LAB — CBC
HCT: 36.6 % — ABNORMAL LOW (ref 39.0–52.0)
HCT: 39 % (ref 39.0–52.0)
Hemoglobin: 12.8 g/dL — ABNORMAL LOW (ref 13.0–17.0)
Hemoglobin: 13.2 g/dL (ref 13.0–17.0)
MCH: 31.1 pg (ref 26.0–34.0)
MCH: 31 pg (ref 26.0–34.0)
MCHC: 35 g/dL (ref 30.0–36.0)
MCHC: 33.8 g/dL (ref 30.0–36.0)
MCV: 89.1 fL (ref 80.0–100.0)
MCV: 91.5 fL (ref 80.0–100.0)
Platelets: 110 10*3/uL — ABNORMAL LOW (ref 150–400)
Platelets: 113 10*3/uL — ABNORMAL LOW (ref 150–400)
RBC: 4.11 MIL/uL — ABNORMAL LOW (ref 4.22–5.81)
RBC: 4.26 MIL/uL (ref 4.22–5.81)
RDW: 12.6 % (ref 11.5–15.5)
RDW: 12.8 % (ref 11.5–15.5)
WBC: 12.3 10*3/uL — ABNORMAL HIGH (ref 4.0–10.5)
WBC: 15.7 10*3/uL — ABNORMAL HIGH (ref 4.0–10.5)
nRBC: 0 % (ref 0.0–0.2)
nRBC: 0 % (ref 0.0–0.2)

## 2022-07-24 LAB — BASIC METABOLIC PANEL
Anion gap: 8 (ref 5–15)
Anion gap: 9 (ref 5–15)
BUN: 6 mg/dL — ABNORMAL LOW (ref 8–23)
BUN: 7 mg/dL — ABNORMAL LOW (ref 8–23)
CO2: 22 mmol/L (ref 22–32)
CO2: 29 mmol/L (ref 22–32)
Calcium: 7.1 mg/dL — ABNORMAL LOW (ref 8.9–10.3)
Calcium: 8.7 mg/dL — ABNORMAL LOW (ref 8.9–10.3)
Chloride: 110 mmol/L (ref 98–111)
Chloride: 100 mmol/L (ref 98–111)
Creatinine, Ser: 0.74 mg/dL (ref 0.61–1.24)
Creatinine, Ser: 0.89 mg/dL (ref 0.61–1.24)
GFR, Estimated: >60 mL/min (ref >60)
GFR, Estimated: >60 mL/min (ref >60)
Glucose, Bld: 142 mg/dL — ABNORMAL HIGH (ref 70–99)
Glucose, Bld: 224 mg/dL — ABNORMAL HIGH (ref 70–99)
Potassium: 3.3 mmol/L — ABNORMAL LOW (ref 3.5–5.1)
Potassium: 4 mmol/L (ref 3.5–5.1)
Sodium: 140 mmol/L (ref 135–145)
Sodium: 138 mmol/L (ref 135–145)

## 2022-07-24 LAB — GLUCOSE, CAPILLARY
Glucose-Capillary: 101 mg/dL — ABNORMAL HIGH (ref 70–99)
Glucose-Capillary: 105 mg/dL — ABNORMAL HIGH (ref 70–99)
Glucose-Capillary: 110 mg/dL — ABNORMAL HIGH (ref 70–99)
Glucose-Capillary: 115 mg/dL — ABNORMAL HIGH (ref 70–99)
Glucose-Capillary: 119 mg/dL — ABNORMAL HIGH (ref 70–99)
Glucose-Capillary: 122 mg/dL — ABNORMAL HIGH (ref 70–99)
Glucose-Capillary: 180 mg/dL — ABNORMAL HIGH (ref 70–99)
Glucose-Capillary: 191 mg/dL — ABNORMAL HIGH (ref 70–99)
Glucose-Capillary: 213 mg/dL — ABNORMAL HIGH (ref 70–99)
Glucose-Capillary: 256 mg/dL — ABNORMAL HIGH (ref 70–99)

## 2022-07-24 LAB — MAGNESIUM
Magnesium: 2 mg/dL (ref 1.7–2.4)
Magnesium: 2.2 mg/dL (ref 1.7–2.4)

## 2022-07-24 LAB — LIPID PANEL
Cholesterol: 65 mg/dL (ref 0–200)
HDL: 28 mg/dL — ABNORMAL LOW (ref >40)
LDL Cholesterol: 27 mg/dL (ref 0–99)
Total CHOL/HDL Ratio: 2.3 RATIO
Triglycerides: 50 mg/dL (ref <150)
VLDL: 10 mg/dL (ref 0–40)

## 2022-07-24 LAB — ECHO INTRAOPERATIVE TEE
Height: 72.5 in
Weight: 3407.99 oz

## 2022-07-24 MED ORDER — INSULIN ASPART 100 UNIT/ML IJ SOLN
0.0000 [IU] | INTRAMUSCULAR | Status: DC
  Administered 2022-07-24: 4 [IU] via SUBCUTANEOUS
  Administered 2022-07-24: 12 [IU] via SUBCUTANEOUS
  Administered 2022-07-24: 4 [IU] via SUBCUTANEOUS
  Administered 2022-07-25 (×2): 8 [IU] via SUBCUTANEOUS
  Administered 2022-07-25: 4 [IU] via SUBCUTANEOUS
  Administered 2022-07-25: 8 [IU] via SUBCUTANEOUS

## 2022-07-24 MED ORDER — HYDROMORPHONE HCL 1 MG/ML IJ SOLN
1.0000 mg | INTRAMUSCULAR | Status: DC | PRN
  Administered 2022-07-24: 1 mg via INTRAVENOUS
  Filled 2022-07-24: qty 1

## 2022-07-24 MED ORDER — AMLODIPINE BESYLATE 5 MG PO TABS
2.5000 mg | ORAL_TABLET | Freq: Every day | ORAL | Status: DC
  Administered 2022-07-24 – 2022-07-26 (×3): 2.5 mg via ORAL
  Filled 2022-07-24 (×3): qty 1

## 2022-07-24 MED ORDER — FUROSEMIDE 10 MG/ML IJ SOLN
40.0000 mg | Freq: Once | INTRAMUSCULAR | Status: AC
  Administered 2022-07-24: 40 mg via INTRAVENOUS
  Filled 2022-07-24: qty 4

## 2022-07-24 MED ORDER — POLYETHYLENE GLYCOL 3350 17 G PO PACK
17.0000 g | PACK | Freq: Every day | ORAL | Status: DC
  Administered 2022-07-25 – 2022-07-26 (×2): 17 g via ORAL
  Filled 2022-07-24 (×2): qty 1

## 2022-07-24 MED ORDER — ENOXAPARIN SODIUM 40 MG/0.4ML IJ SOSY
40.0000 mg | PREFILLED_SYRINGE | Freq: Every day | INTRAMUSCULAR | Status: DC
  Administered 2022-07-24 – 2022-07-27 (×4): 40 mg via SUBCUTANEOUS
  Filled 2022-07-24 (×4): qty 0.4

## 2022-07-24 MED ORDER — POTASSIUM CHLORIDE 10 MEQ/50ML IV SOLN
10.0000 meq | INTRAVENOUS | Status: AC
  Administered 2022-07-24 (×2): 10 meq via INTRAVENOUS

## 2022-07-24 MED ORDER — POTASSIUM CHLORIDE 10 MEQ/50ML IV SOLN
INTRAVENOUS | Status: AC
  Administered 2022-07-24: 10 meq via INTRAVENOUS
  Filled 2022-07-24: qty 150

## 2022-07-24 MED ORDER — METHOCARBAMOL 500 MG PO TABS
500.0000 mg | ORAL_TABLET | Freq: Three times a day (TID) | ORAL | Status: DC
  Administered 2022-07-24 – 2022-07-28 (×13): 500 mg via ORAL
  Filled 2022-07-24 (×13): qty 1

## 2022-07-24 MED ORDER — DOCUSATE SODIUM 100 MG PO CAPS
100.0000 mg | ORAL_CAPSULE | Freq: Two times a day (BID) | ORAL | Status: DC
  Administered 2022-07-24 – 2022-07-28 (×8): 100 mg via ORAL
  Filled 2022-07-24 (×8): qty 1

## 2022-07-24 MED ORDER — INSULIN DETEMIR 100 UNIT/ML ~~LOC~~ SOLN
10.0000 [IU] | Freq: Every day | SUBCUTANEOUS | Status: DC
  Administered 2022-07-25: 10 [IU] via SUBCUTANEOUS
  Filled 2022-07-24 (×2): qty 0.1

## 2022-07-24 MED ORDER — INSULIN DETEMIR 100 UNIT/ML ~~LOC~~ SOLN
10.0000 [IU] | Freq: Once | SUBCUTANEOUS | Status: AC
  Administered 2022-07-24: 10 [IU] via SUBCUTANEOUS
  Filled 2022-07-24: qty 0.1

## 2022-07-24 MED ORDER — ORAL CARE MOUTH RINSE: 15.0000 mL | OROMUCOSAL | Status: DC | PRN

## 2022-07-24 MED ORDER — LIDOCAINE 5 % EX PTCH
2.0000 | MEDICATED_PATCH | CUTANEOUS | Status: DC
  Administered 2022-07-24 – 2022-07-27 (×4): 2 via TRANSDERMAL
  Filled 2022-07-24 (×4): qty 2

## 2022-07-24 MED ORDER — HYDROMORPHONE HCL 1 MG/ML IJ SOLN
2.0000 mg | INTRAMUSCULAR | Status: DC | PRN
  Administered 2022-07-24 – 2022-07-25 (×4): 2 mg via INTRAVENOUS
  Filled 2022-07-24 (×4): qty 2

## 2022-07-24 MED ORDER — METOPROLOL TARTRATE 25 MG PO TABS
25.0000 mg | ORAL_TABLET | Freq: Two times a day (BID) | ORAL | Status: DC
  Administered 2022-07-24 – 2022-07-26 (×4): 25 mg via ORAL
  Filled 2022-07-24 (×4): qty 1

## 2022-07-24 MED ORDER — MELATONIN 3 MG PO TABS
3.0000 mg | ORAL_TABLET | Freq: Every day | ORAL | Status: DC
  Administered 2022-07-24 – 2022-07-27 (×4): 3 mg via ORAL
  Filled 2022-07-24 (×4): qty 1

## 2022-07-24 MED FILL — Lidocaine HCl Local Preservative Free (PF) Inj 2%: INTRAMUSCULAR | Qty: 14 | Status: AC

## 2022-07-24 MED FILL — Potassium Chloride Inj 2 mEq/ML: INTRAVENOUS | Qty: 40 | Status: AC

## 2022-07-24 MED FILL — Heparin Sodium (Porcine) Inj 1000 Unit/ML: Qty: 1000 | Status: AC

## 2022-07-24 NOTE — Progress Notes (Signed)
   NAME:  Luis Castillo, MRN:  [id number], DOB:  05-02-1961, LOS: 1 ADMISSION DATE:  07/23/2022, CONSULTATION DATE:  07/23/22 REFERRING MD:  Kipp Brood - CVTS, CHIEF COMPLAINT:  s/p CABG    History of Present Illness:  62 yo m PMH multivessel CAD, HTN, HLD, DM presented to hospital 07/23/22 for planned CABG x4, R radial artery harvest & L greater saphenous vein harvest.  Operative course uncomplicated  EBL: 123456 cc Received: 650 cell saver cc Xclamp: 52 min  Total pump:109 min  Postoperatively he is admitted to the ICU as per preoperative plan. PCCM is consulted in this setting   Pertinent  Medical History  CAD HTN hLD DM  Significant Hospital Events: Including procedures, antibiotic start and stop dates in addition to other pertinent events   2/21 CABG x 4 ICU admission post op   Interim History / Subjective:  Lots of pain overnight. Wife at bedside. Pain was more internal but today now feels more excisional.  Objective   Blood pressure 128/85, pulse 79, temperature (!) 100.4 F (38 C), resp. rate 19, height 6' 0.5" (1.842 m), weight 99.8 kg, SpO2 95 %. CVP:  [3 mmHg-23 mmHg] 10 mmHg  Vent Mode: CPAP;PSV FiO2 (%):  [40 %-50 %] 40 % Set Rate:  [4 bmp-16 bmp] 4 bmp Vt Set:  [620 mL] 620 mL PEEP:  [5 cmH20] 5 cmH20 Pressure Support:  [5 cmH20-10 cmH20] 10 cmH20 Plateau Pressure:  [17 cmH20] 17 cmH20   Intake/Output Summary (Last 24 hours) at 07/24/2022 0855 Last data filed at 07/24/2022 0700 Gross per 24 hour  Intake 6384.22 ml  Output 4511 ml  Net 1873.22 ml    Filed Weights   07/23/22 0701 07/24/22 0500  Weight: 96.6 kg 99.8 kg    Examination: No distress laying in bed Pupils equal, tracking Midline incision CDI Ext warm, radial harvest site dressed Moves ext to command RASS 0  K low being repleted Cr ok CBG ok CXR left CPA blunting otherwise clear  Resolved Hospital Problem list     Assessment & Plan:   Multivessel 3vCAD s/p CABG (L radial artery  harvest, R greater saphenous vein harvest) Endotracheally intubated // planned post op MV  HTN HLD DM 2  P - drain/pacer management per CCS - Will try dilaudid IVP for severe pain, tramadol moderate in addition to standing tylenol, lidocaine patches, robaxin - Encourage IS, braced coughing - start qHS melatonin - switching to basal bolus insulin - amlodipine to start tomorrow post radial harvest - will follow while in [place] (right click and "Reselect all SmartList Selections" daily)   Diet/type: cardiac DVT prophylaxis: lovenox GI prophylaxis:PPI Lines: Central line and Arterial Line Foley:  Yes, and it is still needed Code Status:  full code Last date of multidisciplinary goals of care discussion [--]  Erskine Emery MD PCCM

## 2022-07-24 NOTE — Progress Notes (Addendum)
TCTS DAILY ICU PROGRESS NOTE                   [address]            [place] [address]          [phone]   1 Day Post-Op Procedure(s) (LRB): CORONARY ARTERY BYPASS GRAFTING (CABG) X FOUR USING LEFT INTERNAL MAMMARY ARTERY, LEFT RADIAL ARTERY AND RIGHT GREATER SAPHENOUS VEIN HARVESTED ENDOSCOPICALLY. (N/A) RADIAL ARTERY HARVEST (Left) TRANSESOPHAGEAL ECHOCARDIOGRAM (N/A)  Total Length of Stay:  LOS: 1 day   Subjective: Primary issus is pain control  Objective: Vital signs in last 24 hours: Temp:  [97.9 F (36.6 C)-101.1 F (38.4 C)] 100.4 F (38 C) (02/22 0700) Pulse Rate:  [68-87] 79 (02/22 0700) Cardiac Rhythm: Normal sinus rhythm (02/21 1600) Resp:  [11-28] 19 (02/22 0700) BP: (93-128)/(74-85) 128/85 (02/22 0700) SpO2:  [93 %-100 %] 95 % (02/22 0700) Arterial Line BP: (92-173)/(49-98) 143/67 (02/22 0700) FiO2 (%):  [40 %-50 %] 40 % (02/21 1741) Weight:  [99.8 kg] 99.8 kg (02/22 0500)  Filed Weights   07/23/22 0701 07/24/22 0500  Weight: 96.6 kg 99.8 kg    Weight change:    Hemodynamic parameters for last 24 hours: CVP:  [3 mmHg-23 mmHg] 10 mmHg  Intake/Output from previous day: 02/21 0701 - 02/22 0700 In: 6384.2 [P.O.:100; I.V.:3602.2; Blood:650; IV Piggyback:2032.1] Out: [id number] [Urine:3000; Blood:951; Chest Tube:560]  Intake/Output this shift: No intake/output data recorded.  Current Meds: Scheduled Meds:  acetaminophen  1,000 mg Oral Q6H   Or   acetaminophen (TYLENOL) oral liquid 160 mg/5 mL  1,000 mg Per Tube Q6H   aspirin EC  325 mg Oral Daily   Or   aspirin  324 mg Per Tube Daily   atorvastatin  40 mg Oral Daily   bisacodyl  10 mg Oral Daily   Or   bisacodyl  10 mg Rectal Daily   Chlorhexidine Gluconate Cloth  6 each Topical Daily   docusate  100 mg Per Tube BID   metoprolol tartrate  12.5 mg Oral BID   Or   metoprolol tartrate  12.5 mg Per Tube BID   mouth rinse  15 mL Mouth Rinse Q2H   mouth rinse  15 mL Mouth Rinse  Q2H   [START ON 07/25/2022] pantoprazole  40 mg Oral Daily   polyethylene glycol  17 g Per Tube Daily   sodium chloride flush  3 mL Intravenous Q12H   Continuous Infusions:  sodium chloride 10 mL/hr at 07/24/22 0700   sodium chloride     sodium chloride     albumin human 60 mL/hr at 07/24/22 0700    ceFAZolin (ANCEF) IV Stopped (07/24/22 0535)   dexmedetomidine (PRECEDEX) IV infusion Stopped (07/23/22 1926)   DOBUTamine     insulin 1 Units/hr (07/24/22 0700)   lactated ringers     lactated ringers     lactated ringers 10 mL/hr at 07/24/22 0700   niCARDipine 2.5 mg/hr (07/24/22 0700)   nitroGLYCERIN Stopped (07/23/22 1734)   norepinephrine (LEVOPHED) Adult infusion     phenylephrine (NEO-SYNEPHRINE) Adult infusion 0 mcg/min (07/23/22 1445)   PRN Meds:.sodium chloride, albumin human, dextrose, fentaNYL (SUBLIMAZE) injection, fentaNYL (SUBLIMAZE) injection, hydrALAZINE, ipratropium-albuterol, lactated ringers, metoprolol tartrate, midazolam, morphine injection, ondansetron (ZOFRAN) IV, mouth rinse, mouth rinse, oxyCODONE, sodium chloride flush, traMADol  General appearance: alert, cooperative, and no distress Heart: regular rate and rhythm Lungs: dim in bases Abdomen: benign Extremities: no edema, left hand n/v  intact Wound: incisions dressed  Lab Results: CBC: Recent Labs    07/23/22 2050 07/24/22 0317  WBC 13.3* 12.3*  HGB 12.6* 12.8*  HCT 36.9* 36.6*  PLT 120* 110*   BMET:  Recent Labs    07/23/22 2050 07/24/22 0317  NA 141 140  K 3.5 3.3*  CL 111 110  CO2 23 22  GLUCOSE 129* 142*  BUN 9 6*  CREATININE 0.67 0.74  CALCIUM 7.1* 7.1*    CMET: Lab Results  Component Value Date   WBC 12.3 (H) 07/24/2022   HGB 12.8 (L) 07/24/2022   HCT 36.6 (L) 07/24/2022   PLT 110 (L) 07/24/2022   GLUCOSE 142 (H) 07/24/2022   CHOL 65 07/24/2022   TRIG 50 07/24/2022   HDL 28 (L) 07/24/2022   LDLCALC 27 07/24/2022   ALT 46 (H) 07/22/2022   AST 30 07/22/2022   NA 140  07/24/2022   K 3.3 (L) 07/24/2022   CL 110 07/24/2022   CREATININE 0.74 07/24/2022   BUN 6 (L) 07/24/2022   CO2 22 07/24/2022   INR 1.4 (H) 07/23/2022   HGBA1C 7.6 (H) 07/22/2022      PT/INR:  Recent Labs    07/23/22 1444  LABPROT 16.7*  INR 1.4*   Radiology: DG Chest Port 1 View  Result Date: 07/23/2022 CLINICAL DATA:  Sixty-two year status post coronary graft EXAM: PORTABLE CHEST - 1 VIEW COMPARISON:  07/22/2018 FINDINGS: The mediastinal contours are within normal limits. No cardiomegaly. Interval intubation with endotracheal tip approximally 1 cm above the carina. Right internal jugular central venous catheter in place with the catheter tip in the superior vena cava. Gastric decompression tube in place terminating off the inferior aspect of this image. Trace pneumomediastinum. Low lung volumes with bibasilar subsegmental atelectasis. No evidence of significant pleural effusion, focal consolidation, or pneumothorax. Median sternotomy wires in place. IMPRESSION: 1. Interval intubation with endotracheal tip approximally 1 cm above the carina. Recommend retraction by ~2 cm. 2. Low lung volumes with associated bibasilar subsegmental atelectasis. Electronically Signed   By: Ruthann Cancer M.D.   On: 07/23/2022 15:08   EP STUDY  Result Date: 07/23/2022 See surgical note for result.    Assessment/Plan: S/P Procedure(s) (LRB): CORONARY ARTERY BYPASS GRAFTING (CABG) X FOUR USING LEFT INTERNAL MAMMARY ARTERY, LEFT RADIAL ARTERY AND RIGHT GREATER SAPHENOUS VEIN HARVESTED ENDOSCOPICALLY. (N/A) RADIAL ARTERY HARVEST (Left) TRANSESOPHAGEAL ECHOCARDIOGRAM (N/A) POD#1  1 Tmax 101.1, s BP 90's-120's, sinus rhythm, some pvc's gtts-none, stating norvasc for radial artery. Adding robaxin and lidocaine patches for pain 2 sats good on 3 liters, extubated without problem 3 good UOP, weight up 3 kg,  4 CT 560 - leave in place for now, slowing 5 BS well controlled 6 K+ 3.3- replace per protocols 7  normal renal fxn 8 minor reactive leukocytosis- trending lower 9 minor expected ABLA 10 thrombocytopenia- platelets  110K- monitor 11 small L effus/atx  12 routine progression, cardiac rehab and pulm hygiene John [facility] Pager [phone] 07/24/2022 7:31 AM  Agree with above Adding meds for pain control Amlodipine for radial harvest IS, ambulation Titrating BB, will remove A line. Floor potentially today  Lajuana Matte

## 2022-07-24 NOTE — TOC Initial Note (Signed)
Transition of Care [facility]) - Initial/Assessment Note    Patient Details  Name: Luis Castillo MRN: [id number] Date of Birth: 1960/12/25  Transition of Care [facility] CM/SW Contact:    Bethena Roys, RN Phone Number: 07/24/2022, 2:09 PM  Clinical Narrative: Patient presented for three vessel CAD- POD-1 CABG. PTA patient was independent from home with spouse. Case Manager will continue to follow for transition of care needs as the patient progresses.                 Expected Discharge Plan: [place] Barriers to Discharge: Continued Medical Work up   Patient Goals and CMS Choice Patient states their goals for this hospitalization and ongoing recovery are:: to return home.          Expected Discharge Plan and Services In-house Referral: NA Discharge Planning Services: CM Consult   Living arrangements for the past 2 months: [place]                   DME Agency: NA    Prior Living Arrangements/Services Living arrangements for the past 2 months: [place] Lives with:: Spouse Patient language and need for interpreter reviewed:: Yes Do you feel safe going back to the place where you live?: Yes      Need for Family Participation in Patient Care: Yes (Comment) Care giver support system in place?: Yes (comment)   Criminal Activity/Legal Involvement Pertinent to Current Situation/Hospitalization: No - Comment as needed  Permission Sought/Granted Permission sought to share information with : Family Supports, Case Manager   Emotional Assessment Appearance:: Appears stated age Attitude/Demeanor/Rapport: Engaged Affect (typically observed): Appropriate Orientation: : Oriented to Situation, Oriented to  Time, Oriented to Place, Oriented to Self Alcohol / Substance Use: Not Applicable Psych Involvement: No (comment)  Admission diagnosis:  S/P CABG x 4 [Z95.1] Patient Active Problem List   Diagnosis Date Noted   S/P CABG x 4  07/23/2022   Endotracheally intubated 07/23/2022   Secondary hypertension 07/23/2022   Abnormal nuclear stress test 07/10/2022   Right knee pain 03/07/2013   Left knee pain 04/22/2011   PCP:  Wenda Low, MD Pharmacy:   [place], [place] [address] Phone: [phone] Fax: [phone]  Social Determinants of Health (SDOH) Social History: SDOH Screenings   Tobacco Use: Low Risk  (07/24/2022)   Readmission Risk Interventions     No data to display

## 2022-07-24 NOTE — Progress Notes (Signed)
Patient ID: Luis Castillo, male   DOB: 05-26-1961, 62 y.o.   MRN: [id number]  TCTS Evening Rounds:  Hemodynamically stable in sinus rhythm.   Sats 95% 2L.  Diuresing well.  Having a lot of pain likely related to chest tubes. May be able to come out tomorrow which should help.

## 2022-07-24 NOTE — Anesthesia Postprocedure Evaluation (Signed)
Anesthesia Post Note  Patient: Luis Castillo  Procedure(s) Performed: CORONARY ARTERY BYPASS GRAFTING (CABG) X FOUR USING LEFT INTERNAL MAMMARY ARTERY, LEFT RADIAL ARTERY AND RIGHT GREATER SAPHENOUS VEIN HARVESTED ENDOSCOPICALLY. (Chest) RADIAL ARTERY HARVEST (Left: Arm Lower) TRANSESOPHAGEAL ECHOCARDIOGRAM     Patient location during evaluation: SICU Anesthesia Type: General Level of consciousness: sedated Pain management: pain level controlled Vital Signs Assessment: post-procedure vital signs reviewed and stable Respiratory status: patient remains intubated per anesthesia plan Cardiovascular status: stable Postop Assessment: no apparent nausea or vomiting Anesthetic complications: no   No notable events documented.  Last Vitals:  Vitals:   07/24/22 1730 07/24/22 1900  BP: (!) 134/91   Pulse: 81   Resp: 14   Temp:  36.8 C  SpO2: 95%     Last Pain:  Vitals:   07/24/22 1945  TempSrc:   PainSc: Luis Castillo

## 2022-07-25 ENCOUNTER — Other Ambulatory Visit: Payer: Self-pay

## 2022-07-25 ENCOUNTER — Inpatient Hospital Stay (HOSPITAL_COMMUNITY): Payer: 59

## 2022-07-25 LAB — GLUCOSE, CAPILLARY
Glucose-Capillary: 220 mg/dL — ABNORMAL HIGH (ref 70–99)
Glucose-Capillary: 236 mg/dL — ABNORMAL HIGH (ref 70–99)
Glucose-Capillary: 177 mg/dL — ABNORMAL HIGH (ref 70–99)
Glucose-Capillary: 142 mg/dL — ABNORMAL HIGH (ref 70–99)
Glucose-Capillary: 136 mg/dL — ABNORMAL HIGH (ref 70–99)

## 2022-07-25 LAB — BASIC METABOLIC PANEL
Anion gap: 8 (ref 5–15)
BUN: 9 mg/dL (ref 8–23)
CO2: 30 mmol/L (ref 22–32)
Calcium: 9 mg/dL (ref 8.9–10.3)
Chloride: 100 mmol/L (ref 98–111)
Creatinine, Ser: 0.85 mg/dL (ref 0.61–1.24)
GFR, Estimated: >60 mL/min (ref >60)
Glucose, Bld: 148 mg/dL — ABNORMAL HIGH (ref 70–99)
Potassium: 3.6 mmol/L (ref 3.5–5.1)
Sodium: 138 mmol/L (ref 135–145)

## 2022-07-25 LAB — CBC
HCT: 37.9 % — ABNORMAL LOW (ref 39.0–52.0)
Hemoglobin: 12.6 g/dL — ABNORMAL LOW (ref 13.0–17.0)
MCH: 30.7 pg (ref 26.0–34.0)
MCHC: 33.2 g/dL (ref 30.0–36.0)
MCV: 92.2 fL (ref 80.0–100.0)
Platelets: 118 10*3/uL — ABNORMAL LOW (ref 150–400)
RBC: 4.11 MIL/uL — ABNORMAL LOW (ref 4.22–5.81)
RDW: 12.8 % (ref 11.5–15.5)
WBC: 13.7 10*3/uL — ABNORMAL HIGH (ref 4.0–10.5)
nRBC: 0 % (ref 0.0–0.2)

## 2022-07-25 MED ORDER — POTASSIUM CHLORIDE CRYS ER 20 MEQ PO TBCR
20.0000 meq | EXTENDED_RELEASE_TABLET | ORAL | Status: AC
  Administered 2022-07-25 (×3): 20 meq via ORAL
  Filled 2022-07-25 (×3): qty 1

## 2022-07-25 MED ORDER — INSULIN ASPART 100 UNIT/ML IJ SOLN
0.0000 [IU] | Freq: Three times a day (TID) | INTRAMUSCULAR | Status: DC
  Administered 2022-07-25 (×2): 2 [IU] via SUBCUTANEOUS
  Administered 2022-07-26: 4 [IU] via SUBCUTANEOUS
  Administered 2022-07-26: 2 [IU] via SUBCUTANEOUS
  Administered 2022-07-27: 4 [IU] via SUBCUTANEOUS
  Administered 2022-07-27: 2 [IU] via SUBCUTANEOUS
  Administered 2022-07-27 – 2022-07-28 (×3): 4 [IU] via SUBCUTANEOUS

## 2022-07-25 MED ORDER — FUROSEMIDE 40 MG PO TABS
40.0000 mg | ORAL_TABLET | Freq: Every day | ORAL | Status: DC
  Administered 2022-07-25 – 2022-07-28 (×4): 40 mg via ORAL
  Filled 2022-07-25 (×4): qty 1

## 2022-07-25 MED ORDER — SODIUM CHLORIDE 0.9% FLUSH
3.0000 mL | Freq: Two times a day (BID) | INTRAVENOUS | Status: DC
  Administered 2022-07-25 – 2022-07-28 (×7): 3 mL via INTRAVENOUS

## 2022-07-25 MED ORDER — AMIODARONE HCL IN DEXTROSE 360-4.14 MG/200ML-% IV SOLN
INTRAVENOUS | Status: AC
  Filled 2022-07-25: qty 200

## 2022-07-25 MED ORDER — AMIODARONE HCL 200 MG PO TABS
400.0000 mg | ORAL_TABLET | Freq: Two times a day (BID) | ORAL | Status: DC
  Administered 2022-07-25 – 2022-07-28 (×6): 400 mg via ORAL
  Filled 2022-07-25 (×6): qty 2

## 2022-07-25 MED ORDER — AMIODARONE LOAD VIA INFUSION
150.0000 mg | Freq: Once | INTRAVENOUS | Status: DC
  Filled 2022-07-25: qty 83.34

## 2022-07-25 MED ORDER — SODIUM CHLORIDE 0.9% FLUSH: 3.0000 mL | INTRAVENOUS | Status: DC | PRN

## 2022-07-25 MED ORDER — INSULIN ASPART 100 UNIT/ML IJ SOLN: 0.0000 [IU] | INTRAMUSCULAR | Status: DC

## 2022-07-25 MED ORDER — ~~LOC~~ CARDIAC SURGERY, PATIENT & FAMILY EDUCATION: Freq: Once | Status: AC

## 2022-07-25 MED ORDER — EMPAGLIFLOZIN 25 MG PO TABS
25.0000 mg | ORAL_TABLET | Freq: Every day | ORAL | Status: DC
  Administered 2022-07-25 – 2022-07-28 (×4): 25 mg via ORAL
  Filled 2022-07-25 (×4): qty 1

## 2022-07-25 MED ORDER — SODIUM CHLORIDE 0.9 % IV SOLN: 250.0000 mL | INTRAVENOUS | Status: DC | PRN

## 2022-07-25 MED ORDER — AMIODARONE IV BOLUS ONLY 150 MG/100ML
INTRAVENOUS | Status: AC
  Administered 2022-07-25: 150 mg
  Filled 2022-07-25: qty 100

## 2022-07-25 NOTE — Progress Notes (Signed)
Patient ambulated in hallway with rolling walker approximately 400 feet with nursing staff. Meesha Sek, Bettina Gavia RN

## 2022-07-25 NOTE — Progress Notes (Signed)
2 Days Post-Op Procedure(s) (LRB): CORONARY ARTERY BYPASS GRAFTING (CABG) X FOUR USING LEFT INTERNAL MAMMARY ARTERY, LEFT RADIAL ARTERY AND RIGHT GREATER SAPHENOUS VEIN HARVESTED ENDOSCOPICALLY. (N/A) RADIAL ARTERY HARVEST (Left) TRANSESOPHAGEAL ECHOCARDIOGRAM (N/A) Subjective: Pain control improved  Objective: Vital signs in last 24 hours: Temp:  [98.2 F (36.8 C)-100.6 F (38.1 C)] 98.6 F (37 C) (02/23 0700) Pulse Rate:  [77-99] 81 (02/23 0700) Cardiac Rhythm: Normal sinus rhythm (02/22 2000) Resp:  [6-26] 12 (02/23 0700) BP: (123-177)/(80-99) 123/84 (02/23 0700) SpO2:  [86 %-98 %] 95 % (02/23 0700) Weight:  [98.2 kg] 98.2 kg (02/23 0500)  Hemodynamic parameters for last 24 hours: CVP:  [10 mmHg-15 mmHg] 13 mmHg  Intake/Output from previous day: 02/22 0701 - 02/23 0700 In: 353.1 [I.V.:253.1; IV Piggyback:100] Out: 3125 [Urine:2900; Chest Tube:225] Intake/Output this shift: No intake/output data recorded.  General appearance: alert, cooperative, and no distress Heart: regular rate and rhythm Lungs: clear to auscultation bilaterally Abdomen: benign Extremities: no edema Wound: evh site ok, left hand n/v intact with incis healing well, sternal dressing in place  Lab Results: Recent Labs    07/24/22 2224 07/25/22 0411  WBC 15.7* 13.7*  HGB 13.2 12.6*  HCT 39.0 37.9*  PLT 113* 118*   BMET:  Recent Labs    07/24/22 2224 07/25/22 0411  NA 138 138  K 4.0 3.6  CL 100 100  CO2 29 30  GLUCOSE 224* 148*  BUN 7* 9  CREATININE 0.89 0.85  CALCIUM 8.7* 9.0    PT/INR:  Recent Labs    07/23/22 1444  LABPROT 16.7*  INR 1.4*   ABG    Component Value Date/Time   PHART 7.343 (L) 07/23/2022 2024   HCO3 24.2 07/23/2022 2024   TCO2 26 07/23/2022 2024   ACIDBASEDEF 1.0 07/23/2022 2024   O2SAT 93 07/23/2022 2024   CBG (last 3)  Recent Labs    07/24/22 2358 07/25/22 0348 07/25/22 0742  GLUCAP 213* 220* 236*    Meds Scheduled Meds:  acetaminophen  1,000  mg Oral Q6H   Or   acetaminophen (TYLENOL) oral liquid 160 mg/5 mL  1,000 mg Per Tube Q6H   amLODipine  2.5 mg Oral Daily   aspirin EC  325 mg Oral Daily   atorvastatin  40 mg Oral Daily   bisacodyl  10 mg Oral Daily   Or   bisacodyl  10 mg Rectal Daily   Chlorhexidine Gluconate Cloth  6 each Topical Daily   [facility], Patient & Family Education   Does not apply Once   docusate sodium  100 mg Oral BID   enoxaparin (LOVENOX) injection  40 mg Subcutaneous QHS   insulin aspart  0-24 Units Subcutaneous Q4H   insulin detemir  10 Units Subcutaneous Daily   lidocaine  2 patch Transdermal Q24H   melatonin  3 mg Oral QHS   methocarbamol  500 mg Oral TID   metoprolol tartrate  25 mg Oral BID   pantoprazole  40 mg Oral Daily   polyethylene glycol  17 g Oral Daily   potassium chloride  20 mEq Oral Q4H   sodium chloride flush  3 mL Intravenous Q12H   sodium chloride flush  3 mL Intravenous Q12H   Continuous Infusions:  sodium chloride Stopped (07/24/22 1846)   sodium chloride 1 mL/hr at 07/24/22 2300   sodium chloride     sodium chloride      ceFAZolin (ANCEF) IV 2 g (07/25/22 [id number])   insulin Stopped (  07/24/22 1250)   lactated ringers     lactated ringers     lactated ringers Stopped (07/24/22 1147)   PRN Meds:.sodium chloride, sodium chloride, dextrose, fentaNYL (SUBLIMAZE) injection, hydrALAZINE, HYDROmorphone (DILAUDID) injection, ipratropium-albuterol, lactated ringers, metoprolol tartrate, midazolam, ondansetron (ZOFRAN) IV, mouth rinse, mouth rinse, mouth rinse, oxyCODONE, sodium chloride flush, sodium chloride flush, traMADol  Xrays DG Chest Port 1 View  Result Date: 07/24/2022 CLINICAL DATA:  Status post CABG. EXAM: PORTABLE CHEST 1 VIEW COMPARISON:  Chest x-ray from yesterday. FINDINGS: Interval removal of the endotracheal and enteric tubes. Unchanged right internal jugular central venous catheter and mediastinal drain. Stable cardiomediastinal silhouette status  post CABG. Continued low lung volumes. Persistent but mildly improved left basilar atelectasis. No pneumothorax or large pleural effusion. No acute osseous abnormality. IMPRESSION: 1. Interval extubation. Persistent but mildly improved left basilar atelectasis. Electronically Signed   By: Titus Dubin M.D.   On: 07/24/2022 08:35   DG Chest Port 1 View  Result Date: 07/23/2022 CLINICAL DATA:  Sixty-two year status post coronary graft EXAM: PORTABLE CHEST - 1 VIEW COMPARISON:  07/22/2018 FINDINGS: The mediastinal contours are within normal limits. No cardiomegaly. Interval intubation with endotracheal tip approximally 1 cm above the carina. Right internal jugular central venous catheter in place with the catheter tip in the superior vena cava. Gastric decompression tube in place terminating off the inferior aspect of this image. Trace pneumomediastinum. Low lung volumes with bibasilar subsegmental atelectasis. No evidence of significant pleural effusion, focal consolidation, or pneumothorax. Median sternotomy wires in place. IMPRESSION: 1. Interval intubation with endotracheal tip approximally 1 cm above the carina. Recommend retraction by ~2 cm. 2. Low lung volumes with associated bibasilar subsegmental atelectasis. Electronically Signed   By: Ruthann Cancer M.D.   On: 07/23/2022 15:08   EP STUDY  Result Date: 07/23/2022 See surgical note for result.   Assessment/Plan: S/P Procedure(s) (LRB): CORONARY ARTERY BYPASS GRAFTING (CABG) X FOUR USING LEFT INTERNAL MAMMARY ARTERY, LEFT RADIAL ARTERY AND RIGHT GREATER SAPHENOUS VEIN HARVESTED ENDOSCOPICALLY. (N/A) RADIAL ARTERY HARVEST (Left) TRANSESOPHAGEAL ECHOCARDIOGRAM (N/A) POD#2  1 Tmax 100.6, s BP 120's-170's, mostly in 130's, sinus with PVC's 2 sats high 80's- low 90's, on 2 l [place] 3 good UOP, weight down about a kg, up about 2 kg from preop- diuretics ordered 4 normal renal fxn 5 BS control is fair, will resume home jardiance, amaryl soon 6  leukocytosis trending lower 7 H/H pretty stable  8 thrombocytopenia trend improving 9 CXR minor atx, small left effus, CT drainage 225- prob d/c soon 10 prob tx to floor, cont pulm hygiene and cardiac rehab     LOS: 2 days    John Giovanni PA-C Pager [phone]  07/25/2022

## 2022-07-25 NOTE — Progress Notes (Signed)
No events, to floor today. PCCM will sign off, please reach out if any questions/concerns.  Erskine Emery MD PCCM

## 2022-07-25 NOTE — Progress Notes (Signed)
   07/25/22 1752  Vitals  BP (!) 132/92  MAP (mmHg) 105  BP Location Right Arm  BP Method Automatic  Patient Position (if appropriate) Lying  Pulse Rate 85  ECG Heart Rate 86  Resp 20  MEWS COLOR  MEWS Score Color Green  Oxygen Therapy  SpO2 92 %  O2 Device Nasal Cannula  O2 Flow Rate (L/min) 1.5 L/min  MEWS Score  MEWS Temp 0  MEWS Systolic 0  MEWS Pulse 0  MEWS RR 0  MEWS LOC 0  MEWS Score 0   Patient Central line removed as ordered. Tip intact pressure health and dressing applied. Bedside RN made aware patient on bed rest for 30 minutes.call bell within reach family at bedside.  Meleane Selinger, Bettina Gavia rN

## 2022-07-25 NOTE — Progress Notes (Signed)
      [address]       [place] [address]             [phone]                 2 Days Post-Op Procedure(s) (LRB): CORONARY ARTERY BYPASS GRAFTING (CABG) X FOUR USING LEFT INTERNAL MAMMARY ARTERY, LEFT RADIAL ARTERY AND RIGHT GREATER SAPHENOUS VEIN HARVESTED ENDOSCOPICALLY. (N/A) RADIAL ARTERY HARVEST (Left) TRANSESOPHAGEAL ECHOCARDIOGRAM (N/A)   Events: No events _______________________________________________________________ Vitals: BP 123/84   Pulse 81   Temp 98.6 F (37 C) (Oral)   Resp 12   Ht 6' 0.5" (1.842 m)   Wt 98.2 kg   SpO2 95%   BMI 28.96 kg/m  Filed Weights   07/23/22 0701 07/24/22 0500 07/25/22 0500  Weight: 96.6 kg 99.8 kg 98.2 kg     - Neuro: alert NAD  - Cardiovascular: sinus  Drips: none.   CVP:  [10 mmHg-15 mmHg] 13 mmHg  - Pulm: EWOB    ABG    Component Value Date/Time   PHART 7.343 (L) 07/23/2022 2024   PCO2ART 45.2 07/23/2022 2024   PO2ART 75 (L) 07/23/2022 2024   HCO3 24.2 07/23/2022 2024   TCO2 26 07/23/2022 2024   ACIDBASEDEF 1.0 07/23/2022 2024   O2SAT 93 07/23/2022 2024    - Abd: ND - Extremity: warm  .Intake/Output      02/22 0701 02/23 0700 02/23 0701 02/24 0700   P.O.     I.V. (mL/kg) 253.1 (2.6)    Blood     IV Piggyback 100    Total Intake(mL/kg) 353.1 (3.6)    Urine (mL/kg/hr) 2900 (1.2)    Blood     Chest Tube 225    Total Output 3125    Net -2771.9            _______________________________________________________________ Labs:    Latest Ref Rng & Units 07/25/2022    4:11 AM 07/24/2022   10:24 PM 07/24/2022    3:17 AM  CBC  WBC 4.0 - 10.5 K/uL 13.7  15.7  12.3   Hemoglobin 13.0 - 17.0 g/dL 12.6  13.2  12.8   Hematocrit 39.0 - 52.0 % 37.9  39.0  36.6   Platelets 150 - 400 K/uL 118  113  110       Latest Ref Rng & Units 07/25/2022    4:11 AM 07/24/2022   10:24 PM 07/24/2022    3:17 AM  CMP  Glucose 70 - 99 mg/dL 148  224  142   BUN 8 - 23 mg/dL 9  7  6   $ Creatinine 0.61 -  1.24 mg/dL 0.85  0.89  0.74   Sodium 135 - 145 mmol/L 138  138  140   Potassium 3.5 - 5.1 mmol/L 3.6  4.0  3.3   Chloride 98 - 111 mmol/L 100  100  110   CO2 22 - 32 mmol/L 30  29  22   $ Calcium 8.9 - 10.3 mg/dL 9.0  8.7  7.1     CXR: clear  _______________________________________________________________  Assessment and Plan: POD 2 s/p CABG with L radial  Neuro: titrating pain meds CV: will remove wires.  A/S/BB Pulm: IS, ambulation Renal: creat stable, continue diuresis GI: on diet Heme: stable ID: afebrile Endo: SSI Dispo: floor   Luis Castillo 07/25/2022 7:47 AM

## 2022-07-25 NOTE — Progress Notes (Signed)
Patient brought to 4E from [place]. VSS. Telemetry applied, CCMD notified. Patient stated pain 0/10 on pain scale. Patient oriented to room and staff. Call bell in reach. Wife present.  Daymon Larsen, RN

## 2022-07-26 LAB — CBC
HCT: 36.6 % — ABNORMAL LOW (ref 39.0–52.0)
Hemoglobin: 12.5 g/dL — ABNORMAL LOW (ref 13.0–17.0)
MCH: 30.7 pg (ref 26.0–34.0)
MCHC: 34.2 g/dL (ref 30.0–36.0)
MCV: 89.9 fL (ref 80.0–100.0)
Platelets: 110 10*3/uL — ABNORMAL LOW (ref 150–400)
RBC: 4.07 MIL/uL — ABNORMAL LOW (ref 4.22–5.81)
RDW: 12.6 % (ref 11.5–15.5)
WBC: 12.5 10*3/uL — ABNORMAL HIGH (ref 4.0–10.5)
nRBC: 0 % (ref 0.0–0.2)

## 2022-07-26 LAB — GLUCOSE, CAPILLARY
Glucose-Capillary: 102 mg/dL — ABNORMAL HIGH (ref 70–99)
Glucose-Capillary: 140 mg/dL — ABNORMAL HIGH (ref 70–99)
Glucose-Capillary: 172 mg/dL — ABNORMAL HIGH (ref 70–99)
Glucose-Capillary: 172 mg/dL — ABNORMAL HIGH (ref 70–99)

## 2022-07-26 LAB — BASIC METABOLIC PANEL
Anion gap: 11 (ref 5–15)
BUN: 9 mg/dL (ref 8–23)
CO2: 27 mmol/L (ref 22–32)
Calcium: 8.5 mg/dL — ABNORMAL LOW (ref 8.9–10.3)
Chloride: 101 mmol/L (ref 98–111)
Creatinine, Ser: 0.91 mg/dL (ref 0.61–1.24)
GFR, Estimated: >60 mL/min (ref >60)
Glucose, Bld: 106 mg/dL — ABNORMAL HIGH (ref 70–99)
Potassium: 3.6 mmol/L (ref 3.5–5.1)
Sodium: 139 mmol/L (ref 135–145)

## 2022-07-26 MED ORDER — POTASSIUM CHLORIDE CRYS ER 20 MEQ PO TBCR
20.0000 meq | EXTENDED_RELEASE_TABLET | Freq: Every day | ORAL | Status: DC
  Administered 2022-07-27 – 2022-07-28 (×2): 20 meq via ORAL
  Filled 2022-07-26 (×2): qty 1

## 2022-07-26 MED ORDER — AMLODIPINE BESYLATE 5 MG PO TABS
5.0000 mg | ORAL_TABLET | Freq: Every day | ORAL | Status: DC
  Administered 2022-07-27: 5 mg via ORAL
  Filled 2022-07-26: qty 1

## 2022-07-26 MED ORDER — METFORMIN HCL 500 MG PO TABS
500.0000 mg | ORAL_TABLET | Freq: Two times a day (BID) | ORAL | Status: DC
  Administered 2022-07-26 – 2022-07-28 (×5): 500 mg via ORAL
  Filled 2022-07-26 (×5): qty 1

## 2022-07-26 MED ORDER — METOPROLOL TARTRATE 25 MG PO TABS
37.5000 mg | ORAL_TABLET | Freq: Two times a day (BID) | ORAL | Status: DC
  Administered 2022-07-26 – 2022-07-27 (×3): 37.5 mg via ORAL
  Filled 2022-07-26 (×3): qty 1

## 2022-07-26 MED ORDER — POTASSIUM CHLORIDE CRYS ER 20 MEQ PO TBCR
30.0000 meq | EXTENDED_RELEASE_TABLET | Freq: Two times a day (BID) | ORAL | Status: AC
  Administered 2022-07-26 (×2): 30 meq via ORAL
  Filled 2022-07-26 (×2): qty 1

## 2022-07-26 MED ORDER — GLIMEPIRIDE 2 MG PO TABS
2.0000 mg | ORAL_TABLET | Freq: Every day | ORAL | Status: DC
  Filled 2022-07-26: qty 1

## 2022-07-26 NOTE — Progress Notes (Signed)
Came back to do ed. Ed given to pt and spouse. Discussed heart healthy diet, sternal precautions, IS use, wound care, and exercise guidelines. Will refer to [facility]. Pt left in the bed w/ call bell in reach. Discussed family care upon discharge w/ pt.

## 2022-07-26 NOTE — Progress Notes (Signed)
CARDIAC REHAB PHASE I   PRE:  Rate/Rhythm: 86 SR  BP:  Sitting: 143/87      SaO2: 98 RA  MODE:  Ambulation: 400 ft   POST:  Rate/Rhythm: 91 SR  BP:  Sitting: 173/99      SaO2: 96 RA  Pt tolerated exercise well and amb 400 ft with 2 wheeled walker, and stand-by assist. Pt denies CP, SOB, or dizziness throughout walk. Encouraged continual IS use. RN in room to give morning meds. Will try to come back to do ed. Will continue to follow.  MR:[id number] Sheppard Plumber, MS, ACSM-CEP 07/26/2022 9:49 AM

## 2022-07-26 NOTE — Progress Notes (Addendum)
      [address]       [place] [address]             [phone]        3 Days Post-Op Procedure(s) (LRB): CORONARY ARTERY BYPASS GRAFTING (CABG) X FOUR USING LEFT INTERNAL MAMMARY ARTERY, LEFT RADIAL ARTERY AND RIGHT GREATER SAPHENOUS VEIN HARVESTED ENDOSCOPICALLY. (N/A) RADIAL ARTERY HARVEST (Left) TRANSESOPHAGEAL ECHOCARDIOGRAM (N/A)  Subjective: Patient felt a fib yesterday. He is passing flatus but no bowel movement yet.  Objective: Vital signs in last 24 hours: Temp:  [98.2 F (36.8 C)-99.3 F (37.4 C)] 98.2 F (36.8 C) (02/24 0200) Pulse Rate:  [75-112] 75 (02/24 0200) Cardiac Rhythm: Atrial fibrillation (02/23 2130) Resp:  [10-20] 19 (02/24 0200) BP: (106-164)/(65-98) 119/80 (02/24 0200) SpO2:  [90 %-98 %] 98 % (02/24 0200) Weight:  [101.9 kg] 101.9 kg (02/24 0200)  Pre op weight 96.6 kg Current Weight  07/26/22 101.9 kg      Intake/Output from previous day: 02/23 0701 - 02/24 0700 In: 229 [P.O.:120; I.V.:9; IV Piggyback:100] Out: 2750 [Urine:2740; Chest Tube:10]   Physical Exam:  Cardiovascular: RRR Pulmonary: Slightly diminished bibasilar breath sounds L>R Abdomen: Soft, non tender, bowel sounds present. Extremities: Mild bilateral lower extremity edema. Motor/sensory intact left forearm (radial artery harvest).  Wounds: Aquacel sternal dressing removed and wound is clean and dry.  No erythema or signs of infection. RLE wound is clean and dry. Left forearm wound has bloody drainage closest to elbow. Dressing changed this am. No sign of infection.  Lab Results: CBC: Recent Labs    07/25/22 0411 07/26/22 0146  WBC 13.7* 12.5*  HGB 12.6* 12.5*  HCT 37.9* 36.6*  PLT 118* 110*   BMET:  Recent Labs    07/25/22 0411 07/26/22 0146  NA 138 139  K 3.6 3.6  CL 100 101  CO2 30 27  GLUCOSE 148* 106*  BUN 9 9  CREATININE 0.85 0.91  CALCIUM 9.0 8.5*    PT/INR:  Lab Results  Component Value Date   INR 1.4 (H) 07/23/2022    INR 1.1 07/22/2022   ABG:  INR: Will add last result for INR, ABG once components are confirmed Will add last 4 CBG results once components are confirmed  Assessment/Plan:  1. CV -He had a fib yesterday but quickly converted with IV Amiodarone bolus.  SR this am. On Amiodarone 400 mg bid, Amlodipine 2.5 mg daily and Lopressor 25 mg bid 2.  Pulmonary - On 1 liter of oxygen. Wean as able. Check CXR in am. Encourage incentive spirometer. 3. Volume Overload - On Lasix 40 mg daily 4.  Expected post op acute blood loss anemia - H and H this am stable at 12.5 and 36.6 5. DM-CBGs 142/136/102. Pre op HGA1C 7.6. On Insulin. Will restart low dose Metformin and stop scheduled Insulin. He will need close medical follow up after discharge. 6. Supplement potassium 7. Mild thrombocytopenia-platelets this am 110,000 8. Discharge 1-2 days  Donielle M ZimmermanPA-C 7:44 AM  Agree with above Doing well [place]

## 2022-07-27 ENCOUNTER — Inpatient Hospital Stay (HOSPITAL_COMMUNITY): Payer: 59

## 2022-07-27 LAB — CBC
HCT: 39.3 % (ref 39.0–52.0)
Hemoglobin: 13.6 g/dL (ref 13.0–17.0)
MCH: 30.8 pg (ref 26.0–34.0)
MCHC: 34.6 g/dL (ref 30.0–36.0)
MCV: 89.1 fL (ref 80.0–100.0)
Platelets: 164 10*3/uL (ref 150–400)
RBC: 4.41 MIL/uL (ref 4.22–5.81)
RDW: 12.4 % (ref 11.5–15.5)
WBC: 10.5 10*3/uL (ref 4.0–10.5)
nRBC: 0 % (ref 0.0–0.2)

## 2022-07-27 LAB — GLUCOSE, CAPILLARY
Glucose-Capillary: 123 mg/dL — ABNORMAL HIGH (ref 70–99)
Glucose-Capillary: 153 mg/dL — ABNORMAL HIGH (ref 70–99)
Glucose-Capillary: 167 mg/dL — ABNORMAL HIGH (ref 70–99)
Glucose-Capillary: 180 mg/dL — ABNORMAL HIGH (ref 70–99)
Glucose-Capillary: 169 mg/dL — ABNORMAL HIGH (ref 70–99)

## 2022-07-27 MED ORDER — BENAZEPRIL HCL 5 MG PO TABS
10.0000 mg | ORAL_TABLET | Freq: Every day | ORAL | Status: DC
  Administered 2022-07-27 – 2022-07-28 (×2): 10 mg via ORAL
  Filled 2022-07-27 (×2): qty 2

## 2022-07-27 NOTE — Progress Notes (Signed)
Pt ambulated entire unit without assistance. Patient tolerated activity well and is back in bed resting.   Luis Castillo

## 2022-07-27 NOTE — Progress Notes (Addendum)
      [address]       [place], [address]             [phone]        4 Days Post-Op Procedure(s) (LRB): CORONARY ARTERY BYPASS GRAFTING (CABG) X FOUR USING LEFT INTERNAL MAMMARY ARTERY, LEFT RADIAL ARTERY AND RIGHT GREATER SAPHENOUS VEIN HARVESTED ENDOSCOPICALLY. (N/A) RADIAL ARTERY HARVEST (Left) TRANSESOPHAGEAL ECHOCARDIOGRAM (N/A)  Subjective: Patient slept well. He walked a few times yesterday.  Objective: Vital signs in last 24 hours: Temp:  [98.2 F (36.8 C)-98.8 F (37.1 C)] 98.2 F (36.8 C) (02/25 0757) Pulse Rate:  [80-89] 89 (02/24 2011) Cardiac Rhythm: Normal sinus rhythm (02/24 2011) Resp:  [20-21] 21 (02/24 2011) BP: (139-144)/(78-84) 139/78 (02/24 2011) SpO2:  [95 %-96 %] 96 % (02/25 0757) Weight:  [95.1 kg] 95.1 kg (02/25 0616)  Pre op weight 96.6 kg Current Weight  07/27/22 95.1 kg      Intake/Output from previous day: No intake/output data recorded.   Physical Exam:  Cardiovascular: RRR Pulmonary: Slightly diminished bibasilar breath sounds L>R Abdomen: Soft, non tender, bowel sounds present. Extremities: Mild bilateral lower extremity edema. Motor/sensory intact left forearm (radial artery harvest).  Wounds: Sternal wound is clean and dry.  No erythema or signs of infection. RLE wound is clean and dry. Left forearm wound has no more bloody drainage.   Lab Results: CBC: Recent Labs    07/26/22 0146 07/27/22 0552  WBC 12.5* 10.5  HGB 12.5* 13.6  HCT 36.6* 39.3  PLT 110* 164    BMET:  Recent Labs    07/25/22 0411 07/26/22 0146  NA 138 139  K 3.6 3.6  CL 100 101  CO2 30 27  GLUCOSE 148* 106*  BUN 9 9  CREATININE 0.85 0.91  CALCIUM 9.0 8.5*     PT/INR:  Lab Results  Component Value Date   INR 1.4 (H) 07/23/2022   INR 1.1 07/22/2022   ABG:  INR: Will add last result for INR, ABG once components are confirmed Will add last 4 CBG results once components are confirmed  Assessment/Plan:  1. CV -He  had a fib Friday but quickly converted with IV Amiodarone bolus.  He is maintaining SR. On Amiodarone 400 mg bid, Amlodipine 5 mg daily and Lopressor 37.5  mg bid. Restart Benazepril for better BP control. 2.  Pulmonary - On room air. PA/LAT CXR this am shows mild left base atelectasis and small left pleural effusion. Encourage incentive spirometer. 3. Volume Overload - On Lasix 40 mg daily 4.  Expected post op acute blood loss anemia resolved - H and H this am stable at 13.6 and 39.3 5. DM-CBGs 172/123/153. Pre op HGA1C 7.6. On low dose Metformin. He will need close medical follow up after discharge. 6. Thrombocytopenia resolved-platelets this am 164,000 7. May be able to discharge later today if BP better controlled  Donielle M ZimmermanPA-C 8:36 AM   Agree with above Titrating HTN meds Home tomorrow  Lajuana Matte

## 2022-07-28 LAB — TYPE AND SCREEN
ABO/RH(D): A POS
Antibody Screen: NEGATIVE
Unit division: 0
Unit division: 0
Unit division: 0
Unit division: 0
Unit division: 0
Unit division: 0

## 2022-07-28 LAB — BPAM RBC
Blood Product Expiration Date: 202403062359
Blood Product Expiration Date: 202403102359
Blood Product Expiration Date: 202403112359
Blood Product Expiration Date: 202403122359
Blood Product Expiration Date: 202403132359
Blood Product Expiration Date: 202403132359
ISSUE DATE / TIME: 202402230241
ISSUE DATE / TIME: 202402230756
ISSUE DATE / TIME: 202402241018
ISSUE DATE / TIME: 202402251935
Unit Type and Rh: 6200
Unit Type and Rh: 6200
Unit Type and Rh: 6200
Unit Type and Rh: 6200
Unit Type and Rh: 6200
Unit Type and Rh: 6200

## 2022-07-28 LAB — GLUCOSE, CAPILLARY: Glucose-Capillary: 180 mg/dL — ABNORMAL HIGH (ref 70–99)

## 2022-07-28 MED ORDER — OXYCODONE HCL 5 MG PO TABS: 5.0000 mg | ORAL_TABLET | Freq: Four times a day (QID) | ORAL | 0 refills | Status: DC | PRN

## 2022-07-28 MED ORDER — AMLODIPINE BESYLATE 10 MG PO TABS: 10.0000 mg | ORAL_TABLET | Freq: Every day | ORAL | 1 refills | Status: DC

## 2022-07-28 MED ORDER — METOPROLOL TARTRATE 50 MG PO TABS
50.0000 mg | ORAL_TABLET | Freq: Two times a day (BID) | ORAL | Status: DC
  Administered 2022-07-28: 50 mg via ORAL
  Filled 2022-07-28: qty 1

## 2022-07-28 MED ORDER — BENAZEPRIL HCL 10 MG PO TABS: 10.0000 mg | ORAL_TABLET | Freq: Every day | ORAL | 1 refills | Status: DC

## 2022-07-28 MED ORDER — ASPIRIN 325 MG PO TBEC: 325.0000 mg | DELAYED_RELEASE_TABLET | Freq: Every day | ORAL | Status: DC

## 2022-07-28 MED ORDER — AMLODIPINE BESYLATE 10 MG PO TABS
10.0000 mg | ORAL_TABLET | Freq: Every day | ORAL | Status: DC
  Administered 2022-07-28: 10 mg via ORAL
  Filled 2022-07-28: qty 1

## 2022-07-28 MED ORDER — METOPROLOL TARTRATE 50 MG PO TABS: 50.0000 mg | ORAL_TABLET | Freq: Two times a day (BID) | ORAL | 1 refills | Status: DC

## 2022-07-28 MED ORDER — AMIODARONE HCL 200 MG PO TABS: ORAL_TABLET | ORAL | 1 refills | Status: DC

## 2022-07-28 NOTE — Progress Notes (Signed)
Pt walking in hallway with nurse, pt is very active-wants to get back on the elliptical asap. Pt denies questions from Saturday's teachings however he is unsure if he has received OHS book. Will supply materials. Will refer to [facility] for CRPII.   Christen Bame 07/28/2022 8:50 AM   [phone]

## 2022-07-28 NOTE — Progress Notes (Addendum)
Pt received OHS book, wife was educated on appropriate exercise progression, heart healthy diet, and CRPII. Pt is being referred to [facility] for CRPII b/c he works in [place].

## 2022-07-28 NOTE — Progress Notes (Signed)
IV removed by discharge RN.  Patient transported by spouse.

## 2022-07-28 NOTE — Progress Notes (Cosign Needed Addendum)
      [address]       [place] [address]             [phone]      5 Days Post-Op Procedure(s) (LRB): CORONARY ARTERY BYPASS GRAFTING (CABG) X FOUR USING LEFT INTERNAL MAMMARY ARTERY, LEFT RADIAL ARTERY AND RIGHT GREATER SAPHENOUS VEIN HARVESTED ENDOSCOPICALLY. (N/A) RADIAL ARTERY HARVEST (Left) TRANSESOPHAGEAL ECHOCARDIOGRAM (N/A) Subjective: Patient states he had a rough night last night because he could not get comfortable.   Objective: Vital signs in last 24 hours: Temp:  [98 F (36.7 C)-98.8 F (37.1 C)] 98.3 F (36.8 C) (02/26 0341) Pulse Rate:  [69-90] 82 (02/26 0341) Cardiac Rhythm: Normal sinus rhythm;Bundle branch block (02/25 1933) Resp:  [16-20] 20 (02/26 0341) BP: (132-168)/(83-91) 151/89 (02/26 0341) SpO2:  [95 %-97 %] 96 % (02/26 0341)  Hemodynamic parameters for last 24 hours:    Intake/Output from previous day: 02/25 0701 - 02/26 0700 In: -  Out: 1175 [Urine:1175] Intake/Output this shift: No intake/output data recorded.  General appearance: alert, cooperative, and no distress Neurologic: intact Heart: regular rate and rhythm, no murmur Lungs: Slightly diminished bibasilar breath sounds, otherwise clear to auscultation Abdomen: soft, non-tender; bowel sounds normal; no masses,  no organomegaly Extremities: extremities normal, atraumatic, no cyanosis or edema Wound: Clean and dry, no erythema or sign of infection. Slight inflammation at superior portion of sternal incision, no erythema or drainage.   Lab Results: Recent Labs    07/26/22 0146 07/27/22 0552  WBC 12.5* 10.5  HGB 12.5* 13.6  HCT 36.6* 39.3  PLT 110* 164   BMET:  Recent Labs    07/26/22 0146  NA 139  K 3.6  CL 101  CO2 27  GLUCOSE 106*  BUN 9  CREATININE 0.91  CALCIUM 8.5*    PT/INR: No results for input(s): "LABPROT", "INR" in the last 72 hours. ABG    Component Value Date/Time   PHART 7.343 (L) 07/23/2022 2024   HCO3 24.2 07/23/2022 2024    TCO2 26 07/23/2022 2024   ACIDBASEDEF 1.0 07/23/2022 2024   O2SAT 93 07/23/2022 2024   CBG (last 3)  Recent Labs    07/27/22 1557 07/27/22 2107 07/28/22 0625  GLUCAP 180* 169* 180*    Assessment/Plan: S/P Procedure(s) (LRB): CORONARY ARTERY BYPASS GRAFTING (CABG) X FOUR USING LEFT INTERNAL MAMMARY ARTERY, LEFT RADIAL ARTERY AND RIGHT GREATER SAPHENOUS VEIN HARVESTED ENDOSCOPICALLY. (N/A) RADIAL ARTERY HARVEST (Left) TRANSESOPHAGEAL ECHOCARDIOGRAM (N/A)  CV: Hypertension, SBP 146-164. On Amlodipine 47m daily, Lopressor 37.573mBID, and Benazapril 1026maily. HR 80s. Afib Friday, now in NSR, bundle branch block. On Amiodarone 400m63mD. Will titrate Lopressor.   Pulm: Saturating 96% on RA. Mild atelectasis and small left pleural effusion on CXR.   GI: +BM yesterday  Endo: CBGs controlled 180/169/180. On Jardiance and Metformin, SSI PRN. Discussed close follow up with PCP. Will restart home meds at discharge.   Renal: UO 2275cc/24hrs. Cr 0.91. Under preop weight.   ID: Leukocytosis resolved  Thrombocytopenia: Resolved 164,000  Expected postop ABLA: Resolved H/H 13.6/39.3   Dispo: Ambulating well. Titrate Lopressor and hopefully D/C to home today.   LOS: 5 days    BailMagdalene River-C  07/28/2022

## 2022-07-28 NOTE — Progress Notes (Signed)
Discharge instructions given. Patient verbalized understanding and all questions were answered.  

## 2022-07-28 NOTE — Plan of Care (Signed)
  Problem: Education: Goal: Understanding of CV disease, CV risk reduction, and recovery process will improve Outcome: Adequate for Discharge Goal: Individualized Educational Video(s) Outcome: Adequate for Discharge   Problem: Activity: Goal: Ability to return to baseline activity level will improve Outcome: Adequate for Discharge   Problem: Cardiovascular: Goal: Ability to achieve and maintain adequate cardiovascular perfusion will improve Outcome: Adequate for Discharge Goal: Vascular access site(s) Level 0-1 will be maintained Outcome: Adequate for Discharge   Problem: Health Behavior/Discharge Planning: Goal: Ability to safely manage health-related needs after discharge will improve Outcome: Adequate for Discharge   Problem: Education: Goal: Knowledge of General Education information will improve Description: Including pain rating scale, medication(s)/side effects and non-pharmacologic comfort measures Outcome: Adequate for Discharge   Problem: Health Behavior/Discharge Planning: Goal: Ability to manage health-related needs will improve Outcome: Adequate for Discharge   Problem: Clinical Measurements: Goal: Ability to maintain clinical measurements within normal limits will improve Outcome: Adequate for Discharge Goal: Will remain free from infection Outcome: Adequate for Discharge Goal: Diagnostic test results will improve Outcome: Adequate for Discharge Goal: Respiratory complications will improve Outcome: Adequate for Discharge Goal: Cardiovascular complication will be avoided Outcome: Adequate for Discharge

## 2022-07-31 ENCOUNTER — Telehealth (HOSPITAL_COMMUNITY): Payer: Self-pay

## 2022-07-31 NOTE — Telephone Encounter (Signed)
Per phase I cardiac rehab, fax referral to [facility] cardiac rehab.

## 2022-08-01 ENCOUNTER — Ambulatory Visit (INDEPENDENT_AMBULATORY_CARE_PROVIDER_SITE_OTHER): Payer: 59 | Admitting: Thoracic Surgery (Cardiothoracic Vascular Surgery)

## 2022-08-01 DIAGNOSIS — Z951 Presence of aortocoronary bypass graft: Secondary | ICD-10-CM

## 2022-08-01 NOTE — Progress Notes (Signed)
     [address]       [place] [address]             [phone]       Patient: Home Provider: Office Consent for Telemedicine visit obtained.  Today's visit was completed via a real-time telehealth (see specific modality noted below). The patient/authorized person provided oral consent at the time of the visit to engage in a telemedicine encounter with the present provider at [facility]. The patient/authorized person was informed of the potential benefits, limitations, and risks of telemedicine. The patient/authorized person expressed understanding that the laws that protect confidentiality also apply to telemedicine. The patient/authorized person acknowledged understanding that telemedicine does not provide emergency services and that he or she would need to call 911 or proceed to the nearest hospital for help if such a need arose.   Total time spent in the clinical discussion 10 minutes.  Telehealth Modality: Phone visit (audio only)  I had a telephone visit with  Luis Castillo who is s/p CABG.  Overall doing well.  Pain is minimal.  Ambulating well. Vitals have been stable.  Luis Castillo will see [place] back in 1 month with a chest x-ray for cardiac rehab clearance.  Luis Castillo

## 2022-08-08 MED FILL — Heparin Sodium (Porcine) Inj 1000 Unit/ML: INTRAMUSCULAR | Qty: 20 | Status: AC

## 2022-08-08 MED FILL — Electrolyte-R (PH 7.4) Solution: INTRAVENOUS | Qty: 4000 | Status: AC

## 2022-08-08 MED FILL — Mannitol IV Soln 20%: INTRAVENOUS | Qty: 500 | Status: AC

## 2022-08-08 MED FILL — Calcium Chloride Inj 10%: INTRAVENOUS | Qty: 10 | Status: AC

## 2022-08-08 MED FILL — Sodium Bicarbonate IV Soln 8.4%: INTRAVENOUS | Qty: 50 | Status: AC

## 2022-08-08 MED FILL — Sodium Chloride IV Soln 0.9%: INTRAVENOUS | Qty: 4000 | Status: AC

## 2022-08-11 NOTE — Progress Notes (Unsigned)
Cardiology Clinic Note   Patient Name: Luis Castillo Date of Encounter: 08/13/2022  Primary Care Provider:  Wenda Low, MD Primary Cardiologist:  Evalina Field, MD  Patient Profile    Luis Castillo 62 year old male presents the clinic today for follow-up evaluation post CABG x 4 on 07/23/2022.  Past Medical History    Past Medical History:  Diagnosis Date   Arthritis    Complication of anesthesia    Heart rate dropped with Knee surgery   Coronary artery disease    Diabetes mellitus without complication ([place])    Headache    History of blood transfusion    Hypertension    Past Surgical History:  Procedure Laterality Date   BACK SURGERY  2006   COLONOSCOPY WITH PROPOFOL N/A 11/10/2016   Procedure: COLONOSCOPY WITH PROPOFOL;  Surgeon: Garlan Fair, MD;  Location: [facility];  Service: Endoscopy;  Laterality: N/A;   CORONARY ARTERY BYPASS GRAFT N/A 07/23/2022   Procedure: CORONARY ARTERY BYPASS GRAFTING (CABG) X FOUR USING LEFT INTERNAL MAMMARY ARTERY, LEFT RADIAL ARTERY AND RIGHT GREATER SAPHENOUS VEIN HARVESTED ENDOSCOPICALLY.;  Surgeon: Lajuana Matte, MD;  Location: [place];  Service: Open Heart Surgery;  Laterality: N/A;   KNEE CARTILAGE SURGERY  2015   Both knees done.   LEFT HEART CATH AND CORONARY ANGIOGRAPHY N/A 07/10/2022   Procedure: LEFT HEART CATH AND CORONARY ANGIOGRAPHY;  Surgeon: Lorretta Harp, MD;  Location: [facility] [facility];  Service: Cardiovascular;  Laterality: N/A;   RADIAL ARTERY HARVEST Left 07/23/2022   Procedure: RADIAL ARTERY HARVEST;  Surgeon: Lajuana Matte, MD;  Location: [place];  Service: Open Heart Surgery;  Laterality: Left;   TEE WITHOUT CARDIOVERSION N/A 07/23/2022   Procedure: TRANSESOPHAGEAL ECHOCARDIOGRAM;  Surgeon: Lajuana Matte, MD;  Location: [place];  Service: Open Heart Surgery;  Laterality: N/A;    Allergies  No Known Allergies  History of Present Illness    Luis Castillo has a PMH of coronary  artery disease status post CABG x 4 on 07/23/2022, HTN, HLD, and diabetes.  He underwent coronary calcium CT which showed an elevated calcium score.  He underwent stress testing which was concerning for ischemic changes.  He subsequently had cardiac catheterization which showed severe multivessel coronary disease.  He reported that he continued to exercise regularly and will do 40 minutes of cardiovascular exercise 5-6 times per week without symptoms.  His sister had suddenly died at home but there was no strong family history of coronary disease.  Preoperatively his A1c was noted to be 7.6.  He was seen and evaluated by Dr. Kipp Brood.  Shared decision making was used to decide to proceed with CABG.  He was taken to the operating room 07/23/2022 and underwent CABG x 4.  (LIMA-LAD, radial artery to PLV, SVG-PDA, SVG-diagonal.  He tolerated surgery well.  He was extubated without complications.  He was started on amlodipine for radial artery harvest.  He was fluid volume overloaded and diuresed appropriately.  His chest tubes and p epicardial pacing wires were removed without complication on 0000000.  He was noted to have a brief episode of atrial fibrillation on 07/25/2022.  He received IV amiodarone and converted to sinus rhythm.  He became hypertensive and his amlodipine was increased to 5 mg daily and his Lopressor was increased to 37.5 mg twice daily.  His hypertension continued after benazepril was added to his medication regimen so his metoprolol was uptitrated to 50 mg twice daily  and his amlodipine uptitrated to 10 mg daily.  He continued to progress well and was discharged in stable condition on 07/28/2022.  He presents to the clinic today for follow-up evaluation and states he felt like he was hit by a truck the first 2 days after his surgery.  He is now walking regularly, 2.5 miles daily for the last 3 days.  I encouraged him to not increase his physical activity too fast.  We reviewed his medications.   He and his wife expressed understanding.  We reviewed sternal precautions.  We reviewed his CABG surgery.  His blood pressure in the clinic today is 136/88.  His EKG shows some normal sinus rhythm right axis deviation 64 bpm.  He denies subsequent palpitations.  He has noted a few episodes where he feels a head rash when standing.  We reviewed the importance of standing up slowly and taking p.o. hydration.  Will plan follow-up with Dr. Audie Box in 3 months.  Today he denies chest pain, shortness of breath, lower extremity edema, fatigue, palpitations, melena, hematuria, hemoptysis, diaphoresis, weakness,  orthopnea, and PND.     Home Medications    Prior to Admission medications   Medication Sig Start Date End Date Taking? Authorizing Provider  amiodarone (PACERONE) 200 MG tablet Take 2 tabs twice per day for 7 days, then take 1 tab twice per day for 7 days, then take 1 tab once per day thereafter 07/28/22   Thomasenia Sales, Pricilla Larsson, PA-C  amLODipine (NORVASC) 10 MG tablet Take 1 tablet (10 mg total) by mouth daily. 07/28/22   Stehler, Pricilla Larsson, PA-C  aspirin EC 325 MG tablet Take 1 tablet (325 mg total) by mouth daily. 07/28/22   Stehler, Pricilla Larsson, PA-C  atorvastatin (LIPITOR) 40 MG tablet Take 40 mg by mouth daily. 05/05/22   [provider]  benazepril (LOTENSIN) 10 MG tablet Take 1 tablet (10 mg total) by mouth daily. 07/28/22   Stehler, Pricilla Larsson, PA-C  empagliflozin (JARDIANCE) 25 MG TABS tablet Take 25 mg by mouth daily. 01/05/18   [provider]  glimepiride (AMARYL) 2 MG tablet Take 2 mg by mouth every morning. 04/17/22   [provider]  metFORMIN (GLUCOPHAGE) 500 MG tablet Take 1,000 mg by mouth 2 (two) times daily with a meal.    [provider]  metoprolol tartrate (LOPRESSOR) 50 MG tablet Take 1 tablet (50 mg total) by mouth 2 (two) times daily. 07/28/22   Stehler, Pricilla Larsson, PA-C  oxyCODONE (OXY IR/ROXICODONE) 5 MG immediate release tablet Take 1 tablet (5 mg  total) by mouth every 6 (six) hours as needed for severe pain. 07/28/22   Stehler, Pricilla Larsson, PA-C  VIAGRA 100 MG tablet Take 100 mg by mouth daily as needed for erectile dysfunction.  03/03/11   [provider]    Family History    Family History  Problem Relation Age of Onset   Cancer Father    Heart attack Sister    He indicated that his mother is deceased. He indicated that his father is deceased. He indicated that the status of his sister is unknown.  Social History    Social History   Socioeconomic History   Marital status: Married    Spouse name: Not on file   Number of children: 3   Years of education: Not on file   Highest education level: Not on file  Occupational History   Occupation: COO Catering manager  Tobacco Use   Smoking status: Never  Smokeless tobacco: Never  Vaping Use   Vaping Use: Never used  Substance and Sexual Activity   Alcohol use: Yes    Alcohol/week: 2.0 standard drinks of alcohol    Types: 2 Cans of beer per week    Comment: Weekly   Drug use: No   Sexual activity: Yes  Other Topics Concern   Not on file  Social History Narrative   Not on file   Social Determinants of Health   Financial Resource Strain: Not on file  Food Insecurity: Not on file  Transportation Needs: Not on file  Physical Activity: Not on file  Stress: Not on file  Social Connections: Not on file  Intimate Partner Violence: Not on file     Review of Systems    General:  No chills, fever, night sweats or weight changes.  Cardiovascular:  No chest pain, dyspnea on exertion, edema, orthopnea, palpitations, paroxysmal nocturnal dyspnea. Dermatological: No rash, lesions/masses Respiratory: No cough, dyspnea Urologic: No hematuria, dysuria Abdominal:   No nausea, vomiting, diarrhea, bright red blood per rectum, melena, or hematemesis Neurologic:  No visual changes, wkns, changes in mental status. All other systems reviewed and are otherwise negative except  as noted above.  Physical Exam    VS:  BP 136/88   Pulse 64   Ht '6\' 3"'$  (1.905 m)   Wt 208 lb (94.3 kg)   SpO2 96%   BMI 26.00 kg/m  , BMI Body mass index is 26 kg/m. GEN: Well nourished, well developed, in no acute distress. HEENT: normal. Neck: Supple, no JVD, carotid bruits, or masses. Cardiac: RRR, no murmurs, rubs, or gallops. No clubbing, cyanosis, edema.  Radials/DP/PT 2+ and equal bilaterally.  Respiratory:  Respirations regular and unlabored, clear to auscultation bilaterally. GI: Soft, nontender, nondistended, BS + x 4. MS: no deformity or atrophy. Skin: warm and dry, no rash.  Surgical incisions healing well no signs of infection. Neuro:  Strength and sensation are intact. Psych: Normal affect.  Accessory Clinical Findings    Recent Labs: 07/22/2022: ALT 46 07/24/2022: Magnesium 2.2 07/26/2022: BUN 9; Creatinine, Ser 0.91; Potassium 3.6; Sodium 139 07/27/2022: Hemoglobin 13.6; Platelets 164   Recent Lipid Panel    Component Value Date/Time   CHOL 65 07/24/2022 0317   TRIG 50 07/24/2022 0317   HDL 28 (L) 07/24/2022 0317   CHOLHDL 2.3 07/24/2022 0317   VLDL 10 07/24/2022 0317   LDLCALC 27 07/24/2022 0317         ECG personally reviewed by me today-normal sinus rhythm right axis deviation 64 bpm- No acute changes  Echocardiogram 06/17/2022  IMPRESSIONS     1. Left ventricular ejection fraction, by estimation, is 55 to 60%. The  left ventricle has normal function. The left ventricle has no regional  wall motion abnormalities. There is mild concentric left ventricular  hypertrophy. Left ventricular diastolic  parameters are consistent with Grade I diastolic dysfunction (impaired  relaxation).   2. Right ventricular systolic function is normal. The right ventricular  size is normal. Tricuspid regurgitation signal is inadequate for assessing  PA pressure.   3. The mitral valve is normal in structure. Trivial mitral valve  regurgitation. No evidence of  mitral stenosis.   4. The aortic valve is tricuspid. Aortic valve regurgitation is not  visualized. No aortic stenosis is present.   5. Aortic dilatation noted. There is mild dilatation of the aortic root,  measuring 41 mm. There is mild dilatation of the ascending aorta,  measuring 41 mm.  6. The inferior vena cava is normal in size with greater than 50%  respiratory variability, suggesting right atrial pressure of 3 mmHg.   FINDINGS   Left Ventricle: Left ventricular ejection fraction, by estimation, is 55  to 60%. The left ventricle has normal function. The left ventricle has no  regional wall motion abnormalities. The left ventricular internal cavity  size was normal in size. There is   mild concentric left ventricular hypertrophy. Left ventricular diastolic  parameters are consistent with Grade I diastolic dysfunction (impaired  relaxation).   Right Ventricle: The right ventricular size is normal. No increase in  right ventricular wall thickness. Right ventricular systolic function is  normal. Tricuspid regurgitation signal is inadequate for assessing PA  pressure.   Left Atrium: Left atrial size was normal in size.   Right Atrium: Right atrial size was normal in size.   Pericardium: There is no evidence of pericardial effusion.   Mitral Valve: The mitral valve is normal in structure. Trivial mitral  valve regurgitation. No evidence of mitral valve stenosis.   Tricuspid Valve: The tricuspid valve is normal in structure. Tricuspid  valve regurgitation is not demonstrated.   Aortic Valve: The aortic valve is tricuspid. Aortic valve regurgitation is  not visualized. No aortic stenosis is present.   Pulmonic Valve: The pulmonic valve was normal in structure. Pulmonic valve  regurgitation is trivial.   Aorta: Aortic dilatation noted. There is mild dilatation of the aortic  root, measuring 41 mm. There is mild dilatation of the ascending aorta,  measuring 41 mm.   Venous:  The inferior vena cava is normal in size with greater than 50%  respiratory variability, suggesting right atrial pressure of 3 mmHg.   IAS/Shunts: No atrial level shunt detected by color flow Doppler.    Assessment & Plan   1.  Coronary artery disease-notes only surgical site discomfort today.  Surgical incisions healing well without signs of infection.  Underwent CABG x 4 on 07/23/2022.  SVG as well as radial conduit harvest. Continue amlodipine, atorvastatin, metoprolol Heart healthy low-sodium diet-salty 6 given Increase physical activity as tolerated while maintaining sternal precautions  Hyperlipidemia-LDL 27 on 07/24/22.  Previously on lower dose of atorvastatin and increased to 40 mg daily preoperatively. Continue aspirin, atorvastatin Heart healthy low-sodium high-fiber diet Increase physical activity as tolerated while maintaining sternal precautions  Postoperative atrial fibrillation-denies further episodes of accelerated or irregular heartbeat. Continue amiodarone, metoprolol Heart healthy low-sodium diet-salty 6 given Avoid triggers caffeine, chocolate, EtOH, dehydration etc.  Essential hypertension-BP today 136/88.  Lowest blood pressure at home [id number] systolic, encourage p.o. hydration. Continue benazepril, amlodipine, metoprolol Maintain blood pressure log Increase physical activity as tolerated  Disposition: Follow-up with Dr. Audie Box or me in 3-4 months.   Jossie Ng. Davidson Palmieri NP-C     08/13/2022, 12:40 PM [facility] [address] Office [phone] Fax [phone]    I spent 15 minutes examining this patient, reviewing medications, and using patient centered shared decision making involving her cardiac care.  Prior to her visit I spent greater than 20 minutes reviewing her past medical history,  medications, and prior cardiac tests.

## 2022-08-13 ENCOUNTER — Encounter: Payer: Self-pay | Admitting: General Practice

## 2022-08-13 ENCOUNTER — Ambulatory Visit: Payer: 59 | Attending: General Practice | Admitting: General Practice

## 2022-08-13 VITALS — BP 136/88 | HR 64 | Ht 75.0 in | Wt 208.0 lb

## 2022-08-13 DIAGNOSIS — I4891 Unspecified atrial fibrillation: Secondary | ICD-10-CM | POA: Diagnosis not present

## 2022-08-13 DIAGNOSIS — Z951 Presence of aortocoronary bypass graft: Secondary | ICD-10-CM

## 2022-08-13 DIAGNOSIS — E782 Mixed hyperlipidemia: Secondary | ICD-10-CM | POA: Diagnosis not present

## 2022-08-13 DIAGNOSIS — I10 Essential (primary) hypertension: Secondary | ICD-10-CM

## 2022-08-13 DIAGNOSIS — I2510 Atherosclerotic heart disease of native coronary artery without angina pectoris: Secondary | ICD-10-CM | POA: Diagnosis not present

## 2022-08-13 NOTE — Patient Instructions (Addendum)
Medication Instructions:  The current medical regimen is effective;  continue present plan and medications as directed. Please refer to the Current Medication list given to you today.  *If you need a refill on your cardiac medications before your next appointment, please call your pharmacy*  Lab Work: NONE If you have labs (blood work) drawn today and your tests are completely normal, you will receive your results only by: [place] (if you have MyChart) OR A paper copy in the mail If you have any lab test that is abnormal or we need to change your treatment, we will call you to review the results.  Testing/Procedures: NONE  Follow-Up: At [facility], you and your health needs are our priority.  As part of our continuing mission to provide you with exceptional heart care, we have created designated Provider Care Teams.  These Care Teams include your primary Cardiologist (physician) and Advanced Practice Providers (APPs -  Physician Assistants and Nurse Practitioners) who all work together to provide you with the care you need, when you need it.  Your next appointment:   3 month(s)  Provider:   Evalina Field, MD     Other Instructions

## 2022-08-14 ENCOUNTER — Other Ambulatory Visit: Payer: Self-pay | Admitting: Physician Assistant

## 2022-08-14 ENCOUNTER — Other Ambulatory Visit: Payer: Self-pay | Admitting: Cardiovascular Disease

## 2022-08-14 ENCOUNTER — Telehealth: Payer: Self-pay | Admitting: Cardiovascular Disease

## 2022-08-14 NOTE — Telephone Encounter (Signed)
Left message that there is no clearance request in EPIC, not mentioned in NP note from 3/13, and they can have any clearance faxed to our office for review.

## 2022-08-14 NOTE — Telephone Encounter (Signed)
Patient's wife was calling to see if we received medical clearance form from dr

## 2022-08-15 NOTE — Telephone Encounter (Signed)
Call to patient, VM picked up.  LVM to call office

## 2022-08-20 ENCOUNTER — Telehealth: Payer: Self-pay | Admitting: Cardiovascular Disease

## 2022-08-20 ENCOUNTER — Telehealth: Payer: Self-pay | Admitting: *Deleted

## 2022-08-20 ENCOUNTER — Emergency Department (HOSPITAL_BASED_OUTPATIENT_CLINIC_OR_DEPARTMENT_OTHER): Payer: 59

## 2022-08-20 ENCOUNTER — Encounter (HOSPITAL_BASED_OUTPATIENT_CLINIC_OR_DEPARTMENT_OTHER): Payer: Self-pay

## 2022-08-20 ENCOUNTER — Other Ambulatory Visit: Payer: Self-pay

## 2022-08-20 ENCOUNTER — Inpatient Hospital Stay (HOSPITAL_BASED_OUTPATIENT_CLINIC_OR_DEPARTMENT_OTHER)
Admission: EM | Admit: 2022-08-20 | Discharge: 2022-08-23 | DRG: 418 | Disposition: A | Discharge status: Home / Self Care | Payer: 59 | Attending: Family Medicine | Admitting: Family Medicine

## 2022-08-20 DIAGNOSIS — E119 Type 2 diabetes mellitus without complications: Secondary | ICD-10-CM

## 2022-08-20 DIAGNOSIS — I9789 Other postprocedural complications and disorders of the circulatory system, not elsewhere classified: Secondary | ICD-10-CM | POA: Insufficient documentation

## 2022-08-20 DIAGNOSIS — R9431 Abnormal electrocardiogram [ECG] [EKG]: Secondary | ICD-10-CM

## 2022-08-20 DIAGNOSIS — I2510 Atherosclerotic heart disease of native coronary artery without angina pectoris: Secondary | ICD-10-CM | POA: Diagnosis present

## 2022-08-20 DIAGNOSIS — K660 Peritoneal adhesions (postprocedural) (postinfection): Secondary | ICD-10-CM | POA: Diagnosis present

## 2022-08-20 DIAGNOSIS — I159 Secondary hypertension, unspecified: Secondary | ICD-10-CM | POA: Diagnosis present

## 2022-08-20 DIAGNOSIS — Z7982 Long term (current) use of aspirin: Secondary | ICD-10-CM

## 2022-08-20 DIAGNOSIS — K810 Acute cholecystitis: Principal | ICD-10-CM | POA: Diagnosis present

## 2022-08-20 DIAGNOSIS — R0902 Hypoxemia: Secondary | ICD-10-CM | POA: Diagnosis present

## 2022-08-20 DIAGNOSIS — K82A2 Perforation of gallbladder in cholecystitis: Secondary | ICD-10-CM | POA: Diagnosis present

## 2022-08-20 DIAGNOSIS — I5032 Chronic diastolic (congestive) heart failure: Secondary | ICD-10-CM | POA: Diagnosis present

## 2022-08-20 DIAGNOSIS — Z7984 Long term (current) use of oral hypoglycemic drugs: Secondary | ICD-10-CM

## 2022-08-20 DIAGNOSIS — Z8249 Family history of ischemic heart disease and other diseases of the circulatory system: Secondary | ICD-10-CM

## 2022-08-20 DIAGNOSIS — Z01810 Encounter for preprocedural cardiovascular examination: Secondary | ICD-10-CM

## 2022-08-20 DIAGNOSIS — E1165 Type 2 diabetes mellitus with hyperglycemia: Secondary | ICD-10-CM | POA: Diagnosis present

## 2022-08-20 DIAGNOSIS — Z79899 Other long term (current) drug therapy: Secondary | ICD-10-CM

## 2022-08-20 DIAGNOSIS — E785 Hyperlipidemia, unspecified: Secondary | ICD-10-CM | POA: Diagnosis present

## 2022-08-20 DIAGNOSIS — Z951 Presence of aortocoronary bypass graft: Secondary | ICD-10-CM

## 2022-08-20 DIAGNOSIS — I4891 Unspecified atrial fibrillation: Secondary | ICD-10-CM | POA: Insufficient documentation

## 2022-08-20 DIAGNOSIS — K82A1 Gangrene of gallbladder in cholecystitis: Secondary | ICD-10-CM | POA: Diagnosis present

## 2022-08-20 LAB — URINALYSIS, ROUTINE W REFLEX MICROSCOPIC
Bilirubin Urine: NEGATIVE
Glucose, UA: >=500 mg/dL — AB
Hgb urine dipstick: NEGATIVE
Ketones, ur: NEGATIVE mg/dL
Leukocytes,Ua: NEGATIVE
Nitrite: NEGATIVE
Protein, ur: NEGATIVE mg/dL
Specific Gravity, Urine: 1.02 (ref 1.005–1.030)
pH: 5.5 (ref 5.0–8.0)

## 2022-08-20 LAB — CBC
HCT: 42.6 % (ref 39.0–52.0)
Hemoglobin: 14.5 g/dL (ref 13.0–17.0)
MCH: 31 pg (ref 26.0–34.0)
MCHC: 34 g/dL (ref 30.0–36.0)
MCV: 91.2 fL (ref 80.0–100.0)
Platelets: 210 10*3/uL (ref 150–400)
RBC: 4.67 MIL/uL (ref 4.22–5.81)
RDW: 13.3 % (ref 11.5–15.5)
WBC: 12.4 10*3/uL — ABNORMAL HIGH (ref 4.0–10.5)
nRBC: 0 % (ref 0.0–0.2)

## 2022-08-20 LAB — URINALYSIS, MICROSCOPIC (REFLEX)

## 2022-08-20 LAB — COMPREHENSIVE METABOLIC PANEL
ALT: 26 U/L (ref 0–44)
AST: 20 U/L (ref 15–41)
Albumin: 4.2 g/dL (ref 3.5–5.0)
Alkaline Phosphatase: 115 U/L (ref 38–126)
Anion gap: 9 (ref 5–15)
BUN: 17 mg/dL (ref 8–23)
CO2: 26 mmol/L (ref 22–32)
Calcium: 9 mg/dL (ref 8.9–10.3)
Chloride: 100 mmol/L (ref 98–111)
Creatinine, Ser: 1.21 mg/dL (ref 0.61–1.24)
GFR, Estimated: >60 mL/min (ref >60)
Glucose, Bld: 135 mg/dL — ABNORMAL HIGH (ref 70–99)
Potassium: 3.8 mmol/L (ref 3.5–5.1)
Sodium: 135 mmol/L (ref 135–145)
Total Bilirubin: 1.2 mg/dL (ref 0.3–1.2)
Total Protein: 7.8 g/dL (ref 6.5–8.1)

## 2022-08-20 LAB — TROPONIN I (HIGH SENSITIVITY): Troponin I (High Sensitivity): 4 ng/L (ref <18)

## 2022-08-20 LAB — LIPASE, BLOOD: Lipase: 26 U/L (ref 11–51)

## 2022-08-20 LAB — BRAIN NATRIURETIC PEPTIDE: B Natriuretic Peptide: 94.7 pg/mL (ref 0.0–100.0)

## 2022-08-20 MED ORDER — MAGIC MOUTHWASH: 15.0000 mL | Freq: Four times a day (QID) | ORAL | Status: DC | PRN

## 2022-08-20 MED ORDER — ACETAMINOPHEN 650 MG RE SUPP: 650.0000 mg | Freq: Four times a day (QID) | RECTAL | Status: DC | PRN

## 2022-08-20 MED ORDER — ALUM & MAG HYDROXIDE-SIMETH 200-200-20 MG/5ML PO SUSP: 30.0000 mL | Freq: Four times a day (QID) | ORAL | Status: DC | PRN

## 2022-08-20 MED ORDER — METHOCARBAMOL 500 MG PO TABS
1000.0000 mg | ORAL_TABLET | Freq: Four times a day (QID) | ORAL | Status: DC | PRN
  Administered 2022-08-21: 1000 mg via ORAL
  Filled 2022-08-20: qty 2

## 2022-08-20 MED ORDER — OXYCODONE HCL 5 MG PO TABS
5.0000 mg | ORAL_TABLET | ORAL | Status: DC | PRN
  Administered 2022-08-21 – 2022-08-22 (×4): 10 mg via ORAL
  Filled 2022-08-20 (×4): qty 2

## 2022-08-20 MED ORDER — ONDANSETRON HCL 4 MG/2ML IJ SOLN
4.0000 mg | Freq: Four times a day (QID) | INTRAMUSCULAR | Status: DC | PRN
  Administered 2022-08-21: 4 mg via INTRAVENOUS
  Filled 2022-08-20: qty 2

## 2022-08-20 MED ORDER — METHOCARBAMOL 1000 MG/10ML IJ SOLN
1000.0000 mg | Freq: Four times a day (QID) | INTRAVENOUS | Status: DC | PRN
  Filled 2022-08-20: qty 10

## 2022-08-20 MED ORDER — MORPHINE SULFATE (PF) 4 MG/ML IV SOLN
4.0000 mg | Freq: Once | INTRAVENOUS | Status: AC
  Administered 2022-08-20: 4 mg via INTRAVENOUS
  Filled 2022-08-20: qty 1

## 2022-08-20 MED ORDER — SODIUM CHLORIDE 0.9 % IV BOLUS
500.0000 mL | Freq: Once | INTRAVENOUS | Status: AC
  Administered 2022-08-20: 500 mL via INTRAVENOUS

## 2022-08-20 MED ORDER — IOHEXOL 350 MG/ML SOLN
100.0000 mL | Freq: Once | INTRAVENOUS | Status: AC | PRN
  Administered 2022-08-20: 100 mL via INTRAVENOUS

## 2022-08-20 MED ORDER — BISACODYL 10 MG RE SUPP: 10.0000 mg | Freq: Two times a day (BID) | RECTAL | Status: DC | PRN

## 2022-08-20 MED ORDER — ACETAMINOPHEN 325 MG PO TABS
325.0000 mg | ORAL_TABLET | Freq: Four times a day (QID) | ORAL | Status: DC | PRN
  Administered 2022-08-22 – 2022-08-23 (×2): 650 mg via ORAL
  Filled 2022-08-20 (×2): qty 2

## 2022-08-20 MED ORDER — HYDROMORPHONE HCL 1 MG/ML IJ SOLN
1.0000 mg | Freq: Once | INTRAMUSCULAR | Status: AC
  Administered 2022-08-20: 1 mg via INTRAVENOUS
  Filled 2022-08-20: qty 1

## 2022-08-20 MED ORDER — LIP MEDEX EX OINT
TOPICAL_OINTMENT | Freq: Two times a day (BID) | CUTANEOUS | Status: DC
  Administered 2022-08-21 – 2022-08-22 (×2): 75 via TOPICAL
  Filled 2022-08-20 (×2): qty 7

## 2022-08-20 MED ORDER — PROCHLORPERAZINE EDISYLATE 10 MG/2ML IJ SOLN: 5.0000 mg | INTRAMUSCULAR | Status: DC | PRN

## 2022-08-20 MED ORDER — HYDROMORPHONE HCL 1 MG/ML IJ SOLN
0.5000 mg | INTRAMUSCULAR | Status: DC | PRN
  Administered 2022-08-20: 1 mg via INTRAVENOUS
  Administered 2022-08-21 (×6): 2 mg via INTRAVENOUS
  Filled 2022-08-20 (×4): qty 2
  Filled 2022-08-20: qty 1
  Filled 2022-08-20 (×2): qty 2

## 2022-08-20 MED ORDER — MENTHOL 3 MG MT LOZG
1.0000 | LOZENGE | OROMUCOSAL | Status: DC | PRN
  Filled 2022-08-20: qty 9

## 2022-08-20 MED ORDER — PHENOL 1.4 % MT LIQD
2.0000 | OROMUCOSAL | Status: DC | PRN
  Filled 2022-08-20: qty 177

## 2022-08-20 MED ORDER — METOPROLOL TARTRATE 5 MG/5ML IV SOLN: 5.0000 mg | Freq: Four times a day (QID) | INTRAVENOUS | Status: DC | PRN

## 2022-08-20 MED ORDER — FENTANYL CITRATE PF 50 MCG/ML IJ SOSY
50.0000 ug | PREFILLED_SYRINGE | Freq: Once | INTRAMUSCULAR | Status: AC
  Administered 2022-08-20: 50 ug via INTRAVENOUS
  Filled 2022-08-20: qty 1

## 2022-08-20 MED ORDER — SODIUM CHLORIDE 0.9 % IV SOLN
2.0000 g | INTRAVENOUS | Status: DC
  Administered 2022-08-20: 2 g via INTRAVENOUS
  Filled 2022-08-20: qty 20

## 2022-08-20 MED ORDER — SODIUM CHLORIDE 0.9 % IV SOLN
8.0000 mg | Freq: Four times a day (QID) | INTRAVENOUS | Status: DC | PRN
  Filled 2022-08-20: qty 4

## 2022-08-20 MED ORDER — SIMETHICONE 40 MG/0.6ML PO SUSP: 80.0000 mg | Freq: Four times a day (QID) | ORAL | Status: DC | PRN

## 2022-08-20 MED ORDER — LACTATED RINGERS IV BOLUS: 1000.0000 mL | Freq: Three times a day (TID) | INTRAVENOUS | Status: AC | PRN

## 2022-08-20 NOTE — Progress Notes (Signed)
Patient's SPO2 dropped to 83%.  Placed patient on 2 liter nasal cannula.  Patient's SPO2 is 94%.  RT will continue to monitor.

## 2022-08-20 NOTE — ED Provider Notes (Signed)
[facility] Provider Note   CSN: [id number] Arrival date & time: 08/20/22  1641     History {Add pertinent medical, surgical, social history, OB history to HPI:1} Chief Complaint  Patient presents with   Abdominal Pain    Luis Castillo is a 62 y.o. male.  He has a history of diabetes and hypertension, CAD and just had a CABG done in the last month.  He was not having any chest pain but was just incidentally found to have a positive coronary score and got a cath.  He has been doing great from a rehab standpoint exercising walking and was at work today when he began experiencing some subxiphoid discomfort that he rates about 6 out of 10.  It does not radiate anywhere and not associated with anything including shortness of breath diaphoresis nausea vomiting.  No prior abdominal surgeries.  Tried some Pepto-Bismol and Tylenol without improvement.  The history is provided by the patient.  Abdominal Pain Pain location:  Epigastric Pain quality: pressure   Pain radiates to:  Does not radiate Pain severity:  Moderate Onset quality:  Gradual Duration:  6 hours Timing:  Constant Progression:  Unchanged Chronicity:  New Context: not trauma   Relieved by:  Nothing Worsened by:  Nothing Ineffective treatments:  OTC medications Associated symptoms: no chest pain, no constipation, no cough, no diarrhea, no dysuria, no fever, no hematemesis, no nausea, no shortness of breath and no vomiting   Risk factors: recent hospitalization        Home Medications Prior to Admission medications   Medication Sig Start Date End Date Taking? Authorizing Provider  amiodarone (PACERONE) 200 MG tablet Take 2 tabs twice per day for 7 days, then take 1 tab twice per day for 7 days, then take 1 tab once per day thereafter 07/28/22   Thomasenia Sales, Pricilla Larsson, PA-C  amLODipine (NORVASC) 10 MG tablet Take 1 tablet (10 mg total) by mouth daily. 07/28/22   Stehler, Pricilla Larsson,  PA-C  aspirin EC 325 MG tablet Take 1 tablet (325 mg total) by mouth daily. 07/28/22   Stehler, Pricilla Larsson, PA-C  atorvastatin (LIPITOR) 40 MG tablet Take 40 mg by mouth daily. 05/05/22   [provider]  benazepril (LOTENSIN) 10 MG tablet Take 1 tablet (10 mg total) by mouth daily. 07/28/22   Stehler, Pricilla Larsson, PA-C  empagliflozin (JARDIANCE) 25 MG TABS tablet Take 25 mg by mouth daily. 01/05/18   [provider]  glimepiride (AMARYL) 2 MG tablet Take 2 mg by mouth every morning. 04/17/22   [provider]  metFORMIN (GLUCOPHAGE) 500 MG tablet Take 1,000 mg by mouth 2 (two) times daily with a meal.    [provider]  metoprolol tartrate (LOPRESSOR) 50 MG tablet Take 1 tablet (50 mg total) by mouth 2 (two) times daily. 07/28/22   Stehler, Pricilla Larsson, PA-C  oxyCODONE (OXY IR/ROXICODONE) 5 MG immediate release tablet Take 1 tablet (5 mg total) by mouth every 6 (six) hours as needed for severe pain. 07/28/22   Stehler, Pricilla Larsson, PA-C  VIAGRA 100 MG tablet Take 100 mg by mouth daily as needed for erectile dysfunction.  03/03/11   [provider]      Allergies    Patient has no known allergies.    Review of Systems   Review of Systems  Constitutional:  Negative for fever.  Respiratory:  Negative for cough and shortness of breath.   Cardiovascular:  Negative  for chest pain.  Gastrointestinal:  Positive for abdominal pain. Negative for constipation, diarrhea, hematemesis, nausea and vomiting.  Genitourinary:  Negative for dysuria.    Physical Exam Updated Vital Signs BP (!) 155/92 (BP Location: Right Arm)   Pulse 62   Temp 98.5 F (36.9 C) (Oral)   Resp 20   Ht 6\' 3"  (1.905 m)   Wt 91.6 kg   SpO2 100%   BMI 25.25 kg/m  Physical Exam Vitals and nursing note reviewed.  Constitutional:      General: He is not in acute distress.    Appearance: Normal appearance. He is well-developed.  HENT:     Head: Normocephalic and atraumatic.  Eyes:      Conjunctiva/sclera: Conjunctivae normal.  Cardiovascular:     Rate and Rhythm: Normal rate and regular rhythm.     Heart sounds: No murmur heard. Pulmonary:     Effort: Pulmonary effort is normal. No respiratory distress.     Breath sounds: Normal breath sounds.  Abdominal:     Palpations: Abdomen is soft.     Tenderness: There is abdominal tenderness in the right upper quadrant and epigastric area. There is no guarding or rebound.  Musculoskeletal:        General: Normal range of motion.     Cervical back: Neck supple.     Right lower leg: No edema.     Left lower leg: No edema.  Skin:    General: Skin is warm and dry.     Capillary Refill: Capillary refill takes less than 2 seconds.  Neurological:     General: No focal deficit present.     Mental Status: He is alert.     ED Results / Procedures / Treatments   Labs (all labs ordered are listed, but only abnormal results are displayed) Labs Reviewed  LIPASE, BLOOD  COMPREHENSIVE METABOLIC PANEL  CBC  URINALYSIS, ROUTINE W REFLEX MICROSCOPIC  TROPONIN I (HIGH SENSITIVITY)    EKG EKG Interpretation  Date/Time:  Wednesday August 20 2022 16:49:47 EDT Ventricular Rate:  60 PR Interval:  192 QRS Duration: 93 QT Interval:  508 QTC Calculation: 508 R Axis:   114 Text Interpretation: Sinus rhythm Low voltage, precordial leads Abnrm T, consider ischemia, anterolateral lds ST elevation, consider lateral injury Prolonged QT interval prior ecg was rapid afib 2/24 Confirmed by Aletta Edouard [phone]) on 08/20/2022 4:51:44 PM  Radiology No results found.  Procedures Procedures  {Document cardiac monitor, telemetry assessment procedure when appropriate:1}  Medications Ordered in ED Medications  sodium chloride 0.9 % bolus 500 mL (has no administration in time range)    ED Course/ Medical Decision Making/ A&P   {   Click here for ABCD2, HEART and other calculatorsREFRESH Note before signing :1}                           Medical Decision Making Amount and/or Complexity of Data Reviewed Labs: ordered.   This patient complains of ***; this involves an extensive number of treatment Options and is a complaint that carries with it a high risk of complications and morbidity. The differential includes ***  I ordered, reviewed and interpreted labs, which included *** I ordered medication *** and reviewed PMP when indicated. I ordered imaging studies which included *** and I independently    visualized and interpreted imaging which showed *** Additional history obtained from *** Previous records obtained and reviewed *** I consulted *** and discussed lab  and imaging findings and discussed disposition.  Cardiac monitoring reviewed, *** Social determinants considered, *** Critical Interventions: ***  After the interventions stated above, I reevaluated the patient and found *** Admission and further testing considered, ***   {Document critical care time when appropriate:1} {Document review of labs and clinical decision tools ie heart score, Chads2Vasc2 etc:1}  {Document your independent review of radiology images, and any outside records:1} {Document your discussion with family members, caretakers, and with consultants:1} {Document social determinants of health affecting pt's care:1} {Document your decision making why or why not admission, treatments were needed:1} Final Clinical Impression(s) / ED Diagnoses Final diagnoses:  None    Rx / DC Orders ED Discharge Orders     None

## 2022-08-20 NOTE — Telephone Encounter (Signed)
Patient contacted the office stating around 10:30/11 today he began experiencing pain below his sternal incision. Patient states he has not been in this much pain since surgery. States location is at bottom of sternum, below incision. Patient states he is having bowel movements. Denies fever or incisional issues. Patient does not recall doing anything to cause the pain. Per patient, pain is midline but has started to spread outward. Patient states pain has worsened throughout the day. Patient reports taking Tylenol and Pepto Bismol about 30 mins prior to calling our office without any relief. Advised patient that he may wait to see if medications help ease his pain or seek care via local ED for sudden onset and worsening of pain. Patient and wife verbalize understanding.

## 2022-08-20 NOTE — ED Notes (Signed)
Called [facility] for transport @ 2218

## 2022-08-20 NOTE — ED Triage Notes (Signed)
Pt arrives with c/o epigastric pain that started this morning. Pt tried OTC meds without relief. Pt has a CABG about a month ago and no issues that he knows of. Pt denies n/v or SOB.

## 2022-08-20 NOTE — ED Notes (Signed)
Patient transported to CT 

## 2022-08-20 NOTE — Progress Notes (Signed)
Hospitalist Transfer Note:  Transferring facility: [facility] Requesting provider: Dr. Melina Copa (EDP at [facility]) Reason for transfer: admission for further evaluation and management of suspected acute cholecystitis.   62  year old male w/ h/o CAD s/p recent CABG, who presented to [facility] ED complaining of 1 day of epigastric pain associated with nausea.  Denies any recent prior epigastric discomfort.  Not associate any chest pain, fever, chills, shortness of breath.  Vital signs in the ED were notable for the following: Afebrile, normotensive.  Labs were notable for CMP showing no LFT elevation, We will troponin found to be nonelevated.  Additionally, CBC notable for mildly elevated white blood cell count of 12,400.   Imaging notable for CTA chest, abdomen, pelvis, which showed minimal haziness around the gallbladder neck as well as some moderate colonic stool burden, but no evidence of additional acute process.  Right upper quadrant ultrasound showed a distended gallbladder with bladder wall thickening and the presence of sludge.   EDP discussed patient's case with the on-call general surgeon, Dr. Johney Maine, Who recommended [facility] admission to either [facility] or [facility], conveying that general surgery will formally consult and see the patient at either of these locations on the morning of 3/21. He also request initiation of empiric antibiotics for coverage of potential acute cholecystitis.  Subsequently, I accepted this patient for transfer for inpatient admission to a MedSurg bed at [facility] or [facility] (first available) for further work-up and management of the above .       Check [email or web address] for on-call coverage.   Nursing staff, Please call [place] number on Amion as soon as patient's arrival, so appropriate admitting provider can evaluate the pt.     Babs Bertin, DO Hospitalist

## 2022-08-20 NOTE — Telephone Encounter (Signed)
Amy of [facility] states an order for Cardiac Rehab was faxed to the office on 3/07. The order was to be signed by Dr. Audie Box and returned if possible, but they haven't gotten any correspondence. Amy would like to confirm whether this has been received/discuss any updates. Please advise.

## 2022-08-21 ENCOUNTER — Other Ambulatory Visit: Payer: Self-pay

## 2022-08-21 ENCOUNTER — Encounter (HOSPITAL_COMMUNITY)
Admission: EM | Disposition: A | Discharge status: Home / Self Care | Payer: Self-pay | Source: Home / Self Care | Attending: Family Medicine

## 2022-08-21 ENCOUNTER — Inpatient Hospital Stay (HOSPITAL_COMMUNITY): Payer: 59 | Admitting: Certified Registered"

## 2022-08-21 ENCOUNTER — Encounter (HOSPITAL_COMMUNITY): Payer: Self-pay | Admitting: Family Medicine

## 2022-08-21 DIAGNOSIS — Z01810 Encounter for preprocedural cardiovascular examination: Secondary | ICD-10-CM | POA: Diagnosis not present

## 2022-08-21 DIAGNOSIS — K810 Acute cholecystitis: Secondary | ICD-10-CM | POA: Diagnosis present

## 2022-08-21 DIAGNOSIS — I2510 Atherosclerotic heart disease of native coronary artery without angina pectoris: Secondary | ICD-10-CM | POA: Diagnosis present

## 2022-08-21 DIAGNOSIS — I5032 Chronic diastolic (congestive) heart failure: Secondary | ICD-10-CM | POA: Diagnosis present

## 2022-08-21 DIAGNOSIS — K660 Peritoneal adhesions (postprocedural) (postinfection): Secondary | ICD-10-CM | POA: Diagnosis present

## 2022-08-21 DIAGNOSIS — R9431 Abnormal electrocardiogram [ECG] [EKG]: Secondary | ICD-10-CM | POA: Diagnosis not present

## 2022-08-21 DIAGNOSIS — K82A2 Perforation of gallbladder in cholecystitis: Secondary | ICD-10-CM

## 2022-08-21 DIAGNOSIS — K82A1 Gangrene of gallbladder in cholecystitis: Secondary | ICD-10-CM | POA: Diagnosis present

## 2022-08-21 DIAGNOSIS — Z79899 Other long term (current) drug therapy: Secondary | ICD-10-CM | POA: Diagnosis not present

## 2022-08-21 DIAGNOSIS — E1165 Type 2 diabetes mellitus with hyperglycemia: Secondary | ICD-10-CM | POA: Diagnosis present

## 2022-08-21 DIAGNOSIS — R0902 Hypoxemia: Secondary | ICD-10-CM | POA: Diagnosis present

## 2022-08-21 DIAGNOSIS — E785 Hyperlipidemia, unspecified: Secondary | ICD-10-CM | POA: Diagnosis present

## 2022-08-21 DIAGNOSIS — I119 Hypertensive heart disease without heart failure: Secondary | ICD-10-CM

## 2022-08-21 DIAGNOSIS — I9789 Other postprocedural complications and disorders of the circulatory system, not elsewhere classified: Secondary | ICD-10-CM | POA: Diagnosis not present

## 2022-08-21 DIAGNOSIS — Z951 Presence of aortocoronary bypass graft: Secondary | ICD-10-CM

## 2022-08-21 DIAGNOSIS — Z8249 Family history of ischemic heart disease and other diseases of the circulatory system: Secondary | ICD-10-CM | POA: Diagnosis not present

## 2022-08-21 DIAGNOSIS — Z7984 Long term (current) use of oral hypoglycemic drugs: Secondary | ICD-10-CM | POA: Diagnosis not present

## 2022-08-21 DIAGNOSIS — Z7982 Long term (current) use of aspirin: Secondary | ICD-10-CM | POA: Diagnosis not present

## 2022-08-21 DIAGNOSIS — I159 Secondary hypertension, unspecified: Secondary | ICD-10-CM | POA: Diagnosis present

## 2022-08-21 HISTORY — PX: CHOLECYSTECTOMY: SHX55

## 2022-08-21 LAB — COMPREHENSIVE METABOLIC PANEL
ALT: 25 U/L (ref 0–44)
AST: 23 U/L (ref 15–41)
Albumin: 4 g/dL (ref 3.5–5.0)
Alkaline Phosphatase: 100 U/L (ref 38–126)
Anion gap: 13 (ref 5–15)
BUN: 17 mg/dL (ref 8–23)
CO2: 25 mmol/L (ref 22–32)
Calcium: 9 mg/dL (ref 8.9–10.3)
Chloride: 102 mmol/L (ref 98–111)
Creatinine, Ser: 1.11 mg/dL (ref 0.61–1.24)
GFR, Estimated: >60 mL/min (ref >60)
Glucose, Bld: 236 mg/dL — ABNORMAL HIGH (ref 70–99)
Potassium: 4.2 mmol/L (ref 3.5–5.1)
Sodium: 140 mmol/L (ref 135–145)
Total Bilirubin: 1.4 mg/dL — ABNORMAL HIGH (ref 0.3–1.2)
Total Protein: 7.5 g/dL (ref 6.5–8.1)

## 2022-08-21 LAB — GLUCOSE, CAPILLARY
Glucose-Capillary: 229 mg/dL — ABNORMAL HIGH (ref 70–99)
Glucose-Capillary: 201 mg/dL — ABNORMAL HIGH (ref 70–99)
Glucose-Capillary: 173 mg/dL — ABNORMAL HIGH (ref 70–99)
Glucose-Capillary: 164 mg/dL — ABNORMAL HIGH (ref 70–99)
Glucose-Capillary: 233 mg/dL — ABNORMAL HIGH (ref 70–99)
Glucose-Capillary: 236 mg/dL — ABNORMAL HIGH (ref 70–99)
Glucose-Capillary: 220 mg/dL — ABNORMAL HIGH (ref 70–99)

## 2022-08-21 LAB — SURGICAL PCR SCREEN
MRSA, PCR: NEGATIVE
Staphylococcus aureus: POSITIVE — AB

## 2022-08-21 LAB — CBC WITH DIFFERENTIAL/PLATELET
Abs Immature Granulocytes: 0.14 10*3/uL — ABNORMAL HIGH (ref 0.00–0.07)
Basophils Absolute: 0.1 10*3/uL (ref 0.0–0.1)
Basophils Relative: 0 %
Eosinophils Absolute: 0 10*3/uL (ref 0.0–0.5)
Eosinophils Relative: 0 %
HCT: 44.7 % (ref 39.0–52.0)
Hemoglobin: 14.4 g/dL (ref 13.0–17.0)
Immature Granulocytes: 1 %
Lymphocytes Relative: 10 %
Lymphs Abs: 1.8 10*3/uL (ref 0.7–4.0)
MCH: 29.8 pg (ref 26.0–34.0)
MCHC: 32.2 g/dL (ref 30.0–36.0)
MCV: 92.4 fL (ref 80.0–100.0)
Monocytes Absolute: 2 10*3/uL — ABNORMAL HIGH (ref 0.1–1.0)
Monocytes Relative: 10 %
Neutro Abs: 15 10*3/uL — ABNORMAL HIGH (ref 1.7–7.7)
Neutrophils Relative %: 79 %
Platelets: 189 10*3/uL (ref 150–400)
RBC: 4.84 MIL/uL (ref 4.22–5.81)
RDW: 13.5 % (ref 11.5–15.5)
WBC: 19 10*3/uL — ABNORMAL HIGH (ref 4.0–10.5)
nRBC: 0 % (ref 0.0–0.2)

## 2022-08-21 LAB — PROTIME-INR
INR: 1.2 (ref 0.8–1.2)
Prothrombin Time: 14.9 seconds (ref 11.4–15.2)

## 2022-08-21 LAB — HIV ANTIBODY (ROUTINE TESTING W REFLEX): HIV Screen 4th Generation wRfx: NONREACTIVE

## 2022-08-21 SURGERY — LAPAROSCOPIC CHOLECYSTECTOMY
Anesthesia: General

## 2022-08-21 MED ORDER — DEXAMETHASONE SODIUM PHOSPHATE 10 MG/ML IJ SOLN
INTRAMUSCULAR | Status: AC
  Filled 2022-08-21: qty 1

## 2022-08-21 MED ORDER — LACTATED RINGERS IR SOLN
Status: DC | PRN
  Administered 2022-08-21: 1000 mL
  Administered 2022-08-21: 2000 mL

## 2022-08-21 MED ORDER — PIPERACILLIN-TAZOBACTAM 3.375 G IVPB
3.3750 g | Freq: Three times a day (TID) | INTRAVENOUS | Status: DC
  Administered 2022-08-21 – 2022-08-23 (×6): 3.375 g via INTRAVENOUS
  Filled 2022-08-21 (×6): qty 50

## 2022-08-21 MED ORDER — CHLORHEXIDINE GLUCONATE 0.12 % MT SOLN: 15.0000 mL | Freq: Once | OROMUCOSAL | Status: DC

## 2022-08-21 MED ORDER — FENTANYL CITRATE (PF) 100 MCG/2ML IJ SOLN
INTRAMUSCULAR | Status: DC | PRN
  Administered 2022-08-21: 50 ug via INTRAVENOUS

## 2022-08-21 MED ORDER — EPHEDRINE SULFATE (PRESSORS) 50 MG/ML IJ SOLN
INTRAMUSCULAR | Status: DC | PRN
  Administered 2022-08-21: 5 mg via INTRAVENOUS

## 2022-08-21 MED ORDER — INSULIN ASPART 100 UNIT/ML IJ SOLN
INTRAMUSCULAR | Status: AC
  Administered 2022-08-21: 5 [IU] via SUBCUTANEOUS
  Filled 2022-08-21: qty 1

## 2022-08-21 MED ORDER — SUGAMMADEX SODIUM 200 MG/2ML IV SOLN
INTRAVENOUS | Status: DC | PRN
  Administered 2022-08-21: 200 mg via INTRAVENOUS

## 2022-08-21 MED ORDER — ALBUMIN HUMAN 5 % IV SOLN: INTRAVENOUS | Status: DC | PRN

## 2022-08-21 MED ORDER — METOPROLOL TARTRATE 50 MG PO TABS
50.0000 mg | ORAL_TABLET | Freq: Two times a day (BID) | ORAL | Status: DC
  Administered 2022-08-21 – 2022-08-23 (×5): 50 mg via ORAL
  Filled 2022-08-21 (×5): qty 1

## 2022-08-21 MED ORDER — ALBUMIN HUMAN 5 % IV SOLN
INTRAVENOUS | Status: AC
  Filled 2022-08-21: qty 250

## 2022-08-21 MED ORDER — SODIUM CHLORIDE 0.9% FLUSH
3.0000 mL | Freq: Two times a day (BID) | INTRAVENOUS | Status: DC
  Administered 2022-08-21 – 2022-08-22 (×3): 3 mL via INTRAVENOUS

## 2022-08-21 MED ORDER — LACTATED RINGERS IV SOLN: INTRAVENOUS | Status: DC

## 2022-08-21 MED ORDER — ROCURONIUM BROMIDE 10 MG/ML (PF) SYRINGE
PREFILLED_SYRINGE | INTRAVENOUS | Status: AC
  Filled 2022-08-21: qty 10

## 2022-08-21 MED ORDER — ONDANSETRON HCL 4 MG/2ML IJ SOLN
INTRAMUSCULAR | Status: DC | PRN
  Administered 2022-08-21: 4 mg via INTRAVENOUS

## 2022-08-21 MED ORDER — PHENYLEPHRINE 80 MCG/ML (10ML) SYRINGE FOR IV PUSH (FOR BLOOD PRESSURE SUPPORT)
PREFILLED_SYRINGE | INTRAVENOUS | Status: DC | PRN
  Administered 2022-08-21: 80 ug via INTRAVENOUS
  Administered 2022-08-21 (×2): 160 ug via INTRAVENOUS

## 2022-08-21 MED ORDER — ATORVASTATIN CALCIUM 20 MG PO TABS
40.0000 mg | ORAL_TABLET | Freq: Every day | ORAL | Status: DC
  Administered 2022-08-21 – 2022-08-23 (×3): 40 mg via ORAL
  Filled 2022-08-21 (×3): qty 2

## 2022-08-21 MED ORDER — HEPARIN SODIUM (PORCINE) 5000 UNIT/ML IJ SOLN: 5000.0000 [IU] | Freq: Three times a day (TID) | INTRAMUSCULAR | Status: DC

## 2022-08-21 MED ORDER — MUPIROCIN 2 % EX OINT
1.0000 | TOPICAL_OINTMENT | Freq: Two times a day (BID) | CUTANEOUS | Status: DC
  Administered 2022-08-21 – 2022-08-22 (×3): 1 via NASAL
  Filled 2022-08-21: qty 22

## 2022-08-21 MED ORDER — FENTANYL CITRATE (PF) 100 MCG/2ML IJ SOLN
INTRAMUSCULAR | Status: AC
  Filled 2022-08-21: qty 2

## 2022-08-21 MED ORDER — BENAZEPRIL HCL 5 MG PO TABS
10.0000 mg | ORAL_TABLET | Freq: Every day | ORAL | Status: DC
  Administered 2022-08-21 – 2022-08-23 (×3): 10 mg via ORAL
  Filled 2022-08-21 (×3): qty 2

## 2022-08-21 MED ORDER — LACTATED RINGERS IV SOLN: Freq: Once | INTRAVENOUS | Status: AC

## 2022-08-21 MED ORDER — DEXAMETHASONE SODIUM PHOSPHATE 10 MG/ML IJ SOLN
INTRAMUSCULAR | Status: DC | PRN
  Administered 2022-08-21: 4 mg via INTRAVENOUS

## 2022-08-21 MED ORDER — VASOPRESSIN 20 UNIT/ML IV SOLN
INTRAVENOUS | Status: DC | PRN
  Administered 2022-08-21: .5 [IU] via INTRAVENOUS

## 2022-08-21 MED ORDER — BUPIVACAINE-EPINEPHRINE 0.25% -1:200000 IJ SOLN
INTRAMUSCULAR | Status: DC | PRN
  Administered 2022-08-21: 30 mL

## 2022-08-21 MED ORDER — CHLORHEXIDINE GLUCONATE CLOTH 2 % EX PADS
6.0000 | MEDICATED_PAD | Freq: Every day | CUTANEOUS | Status: DC
  Administered 2022-08-21: 6 via TOPICAL

## 2022-08-21 MED ORDER — BUPIVACAINE-EPINEPHRINE (PF) 0.25% -1:200000 IJ SOLN
INTRAMUSCULAR | Status: AC
  Filled 2022-08-21: qty 30

## 2022-08-21 MED ORDER — CHLORHEXIDINE GLUCONATE 0.12 % MT SOLN
15.0000 mL | Freq: Once | OROMUCOSAL | Status: AC
  Administered 2022-08-21: 15 mL via OROMUCOSAL

## 2022-08-21 MED ORDER — ASPIRIN 81 MG PO TBEC
81.0000 mg | DELAYED_RELEASE_TABLET | Freq: Every day | ORAL | Status: DC
  Administered 2022-08-22 – 2022-08-23 (×2): 81 mg via ORAL
  Filled 2022-08-21 (×2): qty 1

## 2022-08-21 MED ORDER — SPY AGENT GREEN - (INDOCYANINE FOR INJECTION)
INTRAMUSCULAR | Status: DC | PRN
  Administered 2022-08-21: 2 mL via INTRAVENOUS

## 2022-08-21 MED ORDER — AMLODIPINE BESYLATE 10 MG PO TABS
10.0000 mg | ORAL_TABLET | Freq: Every day | ORAL | Status: DC
  Administered 2022-08-21 – 2022-08-23 (×3): 10 mg via ORAL
  Filled 2022-08-21 (×3): qty 1

## 2022-08-21 MED ORDER — PHENYLEPHRINE HCL (PRESSORS) 10 MG/ML IV SOLN
INTRAVENOUS | Status: AC
  Filled 2022-08-21: qty 1

## 2022-08-21 MED ORDER — ONDANSETRON HCL 4 MG/2ML IJ SOLN
INTRAMUSCULAR | Status: AC
  Filled 2022-08-21: qty 2

## 2022-08-21 MED ORDER — VASOPRESSIN 20 UNIT/ML IV SOLN
INTRAVENOUS | Status: AC
  Filled 2022-08-21: qty 1

## 2022-08-21 MED ORDER — ROCURONIUM BROMIDE 10 MG/ML (PF) SYRINGE
PREFILLED_SYRINGE | INTRAVENOUS | Status: DC | PRN
  Administered 2022-08-21: 20 mg via INTRAVENOUS
  Administered 2022-08-21: 40 mg via INTRAVENOUS

## 2022-08-21 MED ORDER — MIDAZOLAM HCL 2 MG/2ML IJ SOLN
INTRAMUSCULAR | Status: AC
  Filled 2022-08-21: qty 2

## 2022-08-21 MED ORDER — MIDAZOLAM HCL 2 MG/2ML IJ SOLN
INTRAMUSCULAR | Status: DC | PRN
  Administered 2022-08-21: 2 mg via INTRAVENOUS

## 2022-08-21 MED ORDER — INSULIN ASPART 100 UNIT/ML IJ SOLN
0.0000 [IU] | INTRAMUSCULAR | Status: DC
  Administered 2022-08-21 (×2): 5 [IU] via SUBCUTANEOUS
  Administered 2022-08-21: 3 [IU] via SUBCUTANEOUS
  Administered 2022-08-21: 5 [IU] via SUBCUTANEOUS
  Administered 2022-08-22: 3 [IU] via SUBCUTANEOUS

## 2022-08-21 MED ORDER — POTASSIUM CHLORIDE IN NACL 20-0.9 MEQ/L-% IV SOLN
INTRAVENOUS | Status: DC
  Filled 2022-08-21 (×3): qty 1000

## 2022-08-21 MED ORDER — MEPERIDINE HCL 50 MG/ML IJ SOLN: 6.2500 mg | INTRAMUSCULAR | Status: DC | PRN

## 2022-08-21 MED ORDER — PROPOFOL 10 MG/ML IV BOLUS
INTRAVENOUS | Status: DC | PRN
  Administered 2022-08-21: 150 mg via INTRAVENOUS

## 2022-08-21 MED ORDER — PROPOFOL 10 MG/ML IV BOLUS
INTRAVENOUS | Status: AC
  Filled 2022-08-21: qty 20

## 2022-08-21 MED ORDER — LIDOCAINE HCL (PF) 2 % IJ SOLN
INTRAMUSCULAR | Status: AC
  Filled 2022-08-21: qty 5

## 2022-08-21 MED ORDER — OXYCODONE HCL 5 MG/5ML PO SOLN: 5.0000 mg | Freq: Once | ORAL | Status: DC | PRN

## 2022-08-21 MED ORDER — AMISULPRIDE (ANTIEMETIC) 5 MG/2ML IV SOLN: 10.0000 mg | Freq: Once | INTRAVENOUS | Status: DC | PRN

## 2022-08-21 MED ORDER — OXYCODONE HCL 5 MG PO TABS: 5.0000 mg | ORAL_TABLET | Freq: Once | ORAL | Status: DC | PRN

## 2022-08-21 MED ORDER — LIDOCAINE 2% (20 MG/ML) 5 ML SYRINGE
INTRAMUSCULAR | Status: DC | PRN
  Administered 2022-08-21: 100 mg via INTRAVENOUS

## 2022-08-21 MED ORDER — PROMETHAZINE HCL 25 MG/ML IJ SOLN: 6.2500 mg | INTRAMUSCULAR | Status: DC | PRN

## 2022-08-21 MED ORDER — SUCCINYLCHOLINE CHLORIDE 200 MG/10ML IV SOSY
PREFILLED_SYRINGE | INTRAVENOUS | Status: DC | PRN
  Administered 2022-08-21: 120 mg via INTRAVENOUS

## 2022-08-21 MED ORDER — AMIODARONE HCL 200 MG PO TABS
200.0000 mg | ORAL_TABLET | Freq: Every day | ORAL | Status: DC
  Administered 2022-08-21: 200 mg via ORAL
  Filled 2022-08-21: qty 1

## 2022-08-21 MED ORDER — HYDROMORPHONE HCL 1 MG/ML IJ SOLN: 0.2500 mg | INTRAMUSCULAR | Status: DC | PRN

## 2022-08-21 MED ORDER — PHENYLEPHRINE HCL-NACL 20-0.9 MG/250ML-% IV SOLN
INTRAVENOUS | Status: DC | PRN
  Administered 2022-08-21: 30 ug/min via INTRAVENOUS

## 2022-08-21 SURGICAL SUPPLY — 39 items
APPLIER CLIP 5 13 M/L LIGAMAX5 (MISCELLANEOUS) ×1 IMPLANT
BAG COUNTER SPONGE SURGICOUNT (BAG) IMPLANT
CABLE HIGH FREQUENCY MONO STRZ (ELECTRODE) ×1 IMPLANT
CHLORAPREP W/TINT 26 (MISCELLANEOUS) ×1 IMPLANT
CLIP APPLIE 5 13 M/L LIGAMAX5 (MISCELLANEOUS) ×1 IMPLANT
COVER MAYO STAND STRL (DRAPES) IMPLANT
DERMABOND ADVANCED .7 DNX12 (GAUZE/BANDAGES/DRESSINGS) ×1 IMPLANT
DRAIN CHANNEL 19F RND (DRAIN) IMPLANT
DRAPE C-ARM 42X120 X-RAY (DRAPES) IMPLANT
ELECT REM PT RETURN 15FT ADLT (MISCELLANEOUS) ×1 IMPLANT
ENDOLOOP SUT PDS II  0 18 (SUTURE) ×1
ENDOLOOP SUT PDS II 0 18 (SUTURE) IMPLANT
EVACUATOR SILICONE 100CC (DRAIN) IMPLANT
GLOVE BIO SURGEON STRL SZ 6.5 (GLOVE) ×1 IMPLANT
GLOVE BIOGEL PI IND STRL 7.0 (GLOVE) ×1 IMPLANT
GLOVE INDICATOR 6.5 STRL GRN (GLOVE) ×1 IMPLANT
GOWN STRL REUS W/ TWL XL LVL3 (GOWN DISPOSABLE) ×3 IMPLANT
GOWN STRL REUS W/TWL XL LVL3 (GOWN DISPOSABLE) ×3
IRRIG SUCT STRYKERFLOW 2 WTIP (MISCELLANEOUS) ×1 IMPLANT
IRRIGATION SUCT STRKRFLW 2 WTP (MISCELLANEOUS) ×1 IMPLANT
IV CATH 14GX2 1/4 (CATHETERS) IMPLANT
KIT BASIN OR (CUSTOM PROCEDURE TRAY) ×1 IMPLANT
KIT TURNOVER KIT A (KITS) IMPLANT
PENCIL SMOKE EVACUATOR (MISCELLANEOUS) IMPLANT
SCISSORS LAP 5X35 DISP (ENDOMECHANICALS) ×1 IMPLANT
SET CHOLANGIOGRAPH MIX (MISCELLANEOUS) IMPLANT
SET TUBE SMOKE EVAC HIGH FLOW (TUBING) ×1 IMPLANT
SLEEVE ADV FIXATION 5X100MM (TROCAR) ×1 IMPLANT
SUT ETHILON 2 0 PS N (SUTURE) IMPLANT
SUT VIC AB 2-0 SH 27 (SUTURE) ×1
SUT VIC AB 2-0 SH 27X BRD (SUTURE) ×1 IMPLANT
SUT VIC AB 4-0 PS2 18 (SUTURE) ×1 IMPLANT
SYS BAG RETRIEVAL 10MM (BASKET) ×1 IMPLANT
SYSTEM BAG RETRIEVAL 10MM (BASKET) ×1 IMPLANT
TOWEL OR 17X26 10 PK STRL BLUE (TOWEL DISPOSABLE) ×1 IMPLANT
TOWEL OR NON WOVEN STRL DISP B (DISPOSABLE) ×1 IMPLANT
TRAY LAPAROSCOPIC (CUSTOM PROCEDURE TRAY) ×1 IMPLANT
TROCAR ADV FIXATION 5X100MM (TROCAR) ×2 IMPLANT
TROCAR BALLN 12MMX100 BLUNT (TROCAR) ×1 IMPLANT

## 2022-08-21 NOTE — Discharge Instructions (Signed)
[facility] LAPAROSCOPIC SURGERY: POST OP INSTRUCTIONS Always review your discharge instruction sheet given to you by the facility where your surgery was performed. IF YOU HAVE DISABILITY OR FAMILY LEAVE FORMS, YOU MUST BRING THEM TO THE OFFICE FOR PROCESSING.   DO NOT GIVE THEM TO YOUR DOCTOR.  PAIN CONTROL  First take acetaminophen (Tylenol) AND/or ibuprofen (Advil) to control your pain after surgery.  Follow directions on package.  Taking acetaminophen (Tylenol) and/or ibuprofen (Advil) regularly after surgery will help to control your pain and lower the amount of prescription pain medication you may need.  You should not take more than 3,000 mg (3 grams) of acetaminophen (Tylenol) in 24 hours.  You should not take ibuprofen (Advil), aleve, motrin, naprosyn or other NSAIDS if you have a history of stomach ulcers or chronic kidney disease.  A prescription for pain medication may be given to you upon discharge.  Take your pain medication as prescribed, if you still have uncontrolled pain after taking acetaminophen (Tylenol) or ibuprofen (Advil). Use ice packs to help control pain. If you need a refill on your pain medication, please contact your pharmacy.  They will contact our office to request authorization. Prescriptions will not be filled after 5pm or on week-ends.  HOME MEDICATIONS Take your usually prescribed medications unless otherwise directed.  DIET You should follow a light diet the first few days after arrival home.  Be sure to include lots of fluids daily. Avoid fatty, fried foods.   CONSTIPATION It is common to experience some constipation after surgery and if you are taking pain medication.  Increasing fluid intake and taking a stool softener (such as Colace) will usually help or prevent this problem from occurring.  A mild laxative (Milk of Magnesia or Miralax) should be taken according to package instructions if there are no bowel movements after 48  hours.  WOUND/INCISION CARE Most patients will experience some swelling and bruising in the area of the incisions.  Ice packs will help.  Swelling and bruising can take several days to resolve.  Unless discharge instructions indicate otherwise, follow guidelines below  STERI-STRIPS - you may remove your outer bandages 48 hours after surgery, and you may shower at that time.  You have steri-strips (small skin tapes) in place directly over the incision.  These strips should be left on the skin for 7-10 days.   DERMABOND/SKIN GLUE - you may shower in 24 hours.  The glue will flake off over the next 2-3 weeks. Any sutures or staples will be removed at the office during your follow-up visit.  ACTIVITIES You may resume regular (light) daily activities beginning the next day--such as daily self-care, walking, climbing stairs--gradually increasing activities as tolerated.  You may have sexual intercourse when it is comfortable.  Refrain from any heavy lifting or straining until approved by your doctor. You may drive when you are no longer taking prescription pain medication, you can comfortably wear a seatbelt, and you can safely maneuver your car and apply brakes.  FOLLOW-UP You should see your doctor in the office for a follow-up appointment approximately 2-3 weeks after your surgery.  You should have been given your post-op/follow-up appointment when your surgery was scheduled.  If you did not receive a post-op/follow-up appointment, make sure that you call for this appointment within a day or two after you arrive home to insure a convenient appointment time.   WHEN TO CALL YOUR DOCTOR: Fever over 101.0 Inability to urinate Continued bleeding from incision.   Increased pain, redness, or drainage from the incision. Increasing abdominal pain  The clinic staff is available to answer your questions during regular business hours.  Please don't hesitate to call and ask to speak to one of the nurses for  clinical concerns.  If you have a medical emergency, go to the nearest emergency room or call 911.  A surgeon from [facility] is always on call at the hospital. [address], [address], [place], [place]  [address] ? [address] [address], , [place]   [address] [phone] ? [phone] ? FAX [phone] Web site: [email or web address]  

## 2022-08-21 NOTE — Telephone Encounter (Signed)
Called Amy with [facility]- advised to resend over the order.   Left fax number and call back number.

## 2022-08-21 NOTE — Op Note (Signed)
08/20/2022 - 08/21/2022  2:49 PM  PATIENT:  Luis Castillo  62 y.o. male  Patient Care Team: Wenda Low, MD as PCP - General (Internal Medicine) O'Neal, Cassie Freer, MD as PCP - Cardiology (Cardiology) Lajuana Matte, MD as Consulting Physician (Cardiothoracic Surgery)  PRE-OPERATIVE DIAGNOSIS:  ACUTE CHOLECYSTITIS  POST-OPERATIVE DIAGNOSIS:  GANGRENOUS PERFORATED CHOLECYSTITIS   PROCEDURE:  LAPAROSCOPIC CHOLECYSTECTOMY    Surgeon(s): Leighton Ruff, MD  ASSISTANT: Barkley Boards, PA   ANESTHESIA:   local and general  EBL:26ml  Total I/O In: 1310 [P.O.:60; I.V.:1000; IV Piggyback:250] Out: 450 [Urine:450]  DRAINS: (18F) Jackson-Pratt drain(s) with closed bulb suction in the RUQ    SPECIMEN:  Source of Specimen:  Gallbladder  DISPOSITION OF SPECIMEN:  PATHOLOGY  COUNTS:  YES  PLAN OF CARE:  Patient already  admitted  PATIENT DISPOSITION:  PACU - hemodynamically stable.  INDICATION: 62 y.o. M with ~24 h   The anatomy & physiology of hepatobiliary & pancreatic function was discussed.  The pathophysiology of gallbladder dysfunction was discussed.  Natural history risks without surgery was discussed.   I feel the risks of no intervention will lead to serious problems that outweigh the operative risks; therefore, I recommended cholecystectomy to remove the pathology.  I explained laparoscopic techniques with possible need for an open approach.  Probable cholangiogram to evaluate the bilary tract was explained as well.    Risks such as bleeding, infection, abscess, leak, injury to other organs, need for further treatment, heart attack, death, and other risks were discussed.  I noted a good likelihood this will help address the problem.  Possibility that this will not correct all abdominal symptoms was explained.  Goals of post-operative recovery were discussed as well.    OR FINDINGS: gangrenous, perforated cholecystitis   DESCRIPTION:   The patient was identified  & brought into the operating room. The patient was positioned supine with arms tucked. SCDs were active during the entire case. The patient underwent general anesthesia without any difficulty.  The abdomen was prepped and draped in a sterile fashion. A Surgical Timeout was performed and confirmed our plan.  We positioned the patient in reverse [place] & right side up.  I placed a Hassan laparoscopic port through the umbilicus using open entry technique.  Entry was clean. There were no adhesions to the anterior abdominal wall supraumbilically.  We induced carbon dioxide insufflation. Camera inspection revealed no injury.   I proceeded to continue with laparoscopic technique. I placed a 5 mm port in mid subcostal region, another 27mm port in the right flank near the anterior axillary line, and a 52mm port in the left subxiphoid region obliquely within the falciform ligament.  I turned attention to the right upper quadrant.  The omentum was adherent to the gallbladder.  This was bluntly peeled away and bilious fluid was encountered indicating perforation.  The gallbladder fundus was gangrenous.  The gallbladder fundus was elevated cephalad. I used cautery and blunt dissection to free the peritoneal coverings between the gallbladder and the liver on the posteriolateral and anteriomedial walls.   I used careful blunt and cautery dissection with the suction device to help get a good critical view of the cystic artery and cystic duct. I did further dissection to free a few centimeters of the  gallbladder off the liver bed to get a good critical view of the infundibulum and cystic duct.  The entire fundus and cystic duct were ischemic appearing.  It did not appear that we were going  to be able to shoot a cholangiogram.  We decided to give ICG dye to help delineate the edges of the common bile duct.  This was given and we began a dome down technique while we waited for this to be processed by the liver.  I continued  to separate the gallbladder from the liver bed using electrocautery.  I worked my way around both the left and right sides.  The posterior wall was necrotic but most of this was able to be removed.  An artery was identified along the medial border of the gallbladder.  This appeared to be going to the gallbladder.  It was clipped proximally and distally and divided.  The cystic artery seemed obliterated in its normal location.  I skeletonized the cystic duct.  I then evaluated the common bile duct using the ICG dye and the specialized camera.  The bile duct could be identified in its normal location.  There was approximately 1 to 2 mm of tissue that appeared viable outside the bile duct.  I placed a clip on this tissue and then transected this distally using scissors.  We then used a Endoloop PDS suture to close this under the clip.  We then used the fluorescence to confirm no narrowing of the bile duct.  Given the proximity of the clip to ischemic tissue, I decided to place a drain to monitor for bile leak as the patient heals.  A 19 [place] Blake drain was placed into the right upper quadrant and brought out through the lateral port site.  This was secured into place with a 2-0 nylon suture.  The entire abdomen was then irrigated with approximately 2 L of normal saline.  Bilious fluids were aspirated from the upper quadrants on both sides.  Hemostasis on the liver bed was good.  I removed the gallbladder through the umbilical port site.  I closed the umbilical fascia using 0 Vicryl stitches.   I closed the skin using 4-0 vicryl stitch.  Sterile dressings were applied. The patient was extubated & arrived in the PACU in stable condition.  I had discussed postoperative care with the patient in the holding area.  I believe he will need continued antibiotics for 5 more days.  I will discuss operative findings and postoperative goals / instructions with the patient's family.  Instructions are written in the chart as  well.   Rosario Adie, MD  [facility]

## 2022-08-21 NOTE — Progress Notes (Signed)
PHARMACY NOTE -  [facility] has been assisting with dosing of Zosyn for intra-abdominal infection. Dosage remains stable at 3.375 g IV q8 hr and further renal adjustments per institutional Pharmacy antibiotic protocol  Pharmacy will sign off, following peripherally for culture results, dose adjustments, and length of therapy. Please reconsult if a change in clinical status warrants re-evaluation of dosage.  Reuel Boom, PharmD, BCPS [phone] 08/21/2022, 7:06 AM

## 2022-08-21 NOTE — Consult Note (Addendum)
Cardiology Consultation   Patient ID: [name] MRN: [id number]; DOB: [birth year]  Admit date: 08/20/2022 Date of Consult: 08/21/2022  PCP:  [facility], [facility] Cardiologist:  Luis Field, MD       Patient Profile:   [facility] is a 62 y.o. male with a hx of CAD s/p CABG 07/2022, type 2 DM, history of postoperative atrial fibrillation on amiodarone, HTN, HLD, HFpEF, mild osteoarthritis who is being seen 08/21/2022 for preoperative evaluation at the request of Dr. Florene [facility]  History of Present Illness:   [facility] 62-year-old male with above medical history who is followed by Dr. Audie [facility].  Per chart review, patient establish care with Dr. Audie [facility] in 12/23 for evaluation of coronary artery disease.  Patient had undergone coronary calcium scoring with his PCP and he was found to be in the 99th percentile.  Patient did not have any symptoms of angina and was able to exercise regularly.  He was able to do 40 minutes of cardiovascular exercise 5-6 times per week without symptoms.  He had an echocardiogram on 06/17/2022 that showed EF 55-60%, no regional wall motion abnormalities, grade 1 diastolic dysfunction, normal RV systolic function.  He had a left heart catheterization on 07/10/2022 that showed left main and three-vessel disease.  All his vessels were extremely calcified, he was evaluated by CT surgery.  Ultimately underwent CABG x 4 with LIMA-LAD, radial artery-PLV, SVG-PDA, SVG-diagonal on 07/23/2022.  After surgery, patient was volume overloaded and was diuresed appropriately.  His chest tubes and pericardial pacing wires were removed without complication on 0000000.  He was noted to have a brief episode of atrial fibrillation on 07/25/2022 and was treated with IV amiodarone.  He converted to normal sinus rhythm.  He was discharged on 07/28/2022.  Patient had a phone call with [facility] with cardiovascular surgery on 08/01/2022.  At that time, patient  reported he was overall doing well and had minimal pain.  It was recommended that patient be evaluated in 1 month for chest x-ray and cardiac rehab clearance.  Patient was last seen by cardiology as an outpatient on 08/13/2022.  At that time, patient reported that he felt like he was hit by a truck for the first 2 days after surgery but he had since improved.  He was able to walk 2.5 miles daily without symptoms.  He denied chest pain, shortness of breath, lower extremity edema, fatigue, palpitations.  He remained on amiodarone for prevention of postoperative atrial fibrillation.  Patient presented to the ED at [facility] on 3/20 complaining of 1 day of epigastric pain associated with nausea.  He denied chest pain, fever, chills, shortness of breath.  CTA chest, abdomen, pelvis showed minimal haziness around the gallbladder neck as well as some moderate colonic stool burden, but no evidence of additional acute process.  Right upper quadrant ultrasound showed a distended gallbladder with bladder wall thickening and the presence of sludge.  General surgery was consulted via phone, they recommend that [facility] be [facility] with plans to consult formally in the morning of 3/21.  Patient was started on empiric antibiotics. In the ED, BNP was normal.  High-sensitivity troponin 4.   Patient was admitted to the internal medicine service at [facility] early in the a.m. on 3/21.  Patient's history of CAD and recent CABG, diabetes, HFpEF, cardiology was asked to consult for preoperative risk evaluation.  Patient was seen by [facility] with general surgery who recommended laparoscopic cholecystectomy.  On interview, patient reports that he has been doing very well from a cardiac standpoint since his surgery in February.  He has been exercising and increasing his physical activity every day, able to walk 3 miles without chest pain or shortness of breath.   Denies palpitations, dizziness, syncope, near syncope.  Denies swelling in ankles or left arm.  When patient came into the [facility], his breathing was somewhat shallow because of his abdominal pain.  He was given Dilaudid, and it was noted that his oxygen saturation decreased.  He is now on supplemental oxygen.  Normal work of breathing.  He denies having dyspnea on exertion, orthopnea, PND prior to presentation    Past Medical History:  Diagnosis Date   Arthritis    Complication of anesthesia    Heart rate dropped with Knee surgery   Coronary artery disease    Diabetes mellitus without complication ([facility])    Headache    History of blood transfusion    Hypertension     Past Surgical History:  Procedure Laterality Date   BACK SURGERY  2006   COLONOSCOPY WITH PROPOFOL N/A 11/10/2016   Procedure: COLONOSCOPY WITH PROPOFOL;  Surgeon: Luis Fair, MD;  Location: [facility];  Service: Endoscopy;  Laterality: N/A;   CORONARY ARTERY BYPASS GRAFT N/A 07/23/2022   Procedure: CORONARY ARTERY BYPASS GRAFTING (CABG) X FOUR USING LEFT INTERNAL MAMMARY ARTERY, LEFT RADIAL ARTERY AND RIGHT GREATER SAPHENOUS VEIN HARVESTED ENDOSCOPICALLY.;  Surgeon: Luis Matte, MD;  Location: [place];  Service: Open Heart Surgery;  Laterality: N/A;   KNEE CARTILAGE SURGERY  2015   Both knees done.   LEFT HEART CATH AND CORONARY ANGIOGRAPHY N/A 07/10/2022   Procedure: LEFT HEART CATH AND CORONARY ANGIOGRAPHY;  Surgeon: Luis Harp, MD;  Location: [facility];  Service: Cardiovascular;  Laterality: N/A;   RADIAL ARTERY HARVEST Left 07/23/2022   Procedure: RADIAL ARTERY HARVEST;  Surgeon: Luis Matte, MD;  Location: [place];  Service: Open Heart Surgery;  Laterality: Left;   TEE WITHOUT CARDIOVERSION N/A 07/23/2022   Procedure: TRANSESOPHAGEAL ECHOCARDIOGRAM;  Surgeon: Luis Matte, MD;  Location: [facility];  Service: Open Heart Surgery;  Laterality: N/A;     Home Medications:  Prior to  Admission medications   Medication Sig Start Date End Date Taking? Authorizing Provider  amiodarone (PACERONE) 200 MG tablet Take 2 tabs twice per day for 7 days, then take 1 tab twice per day for 7 days, then take 1 tab once per day thereafter Patient taking differently: Take 200 mg by mouth daily. 07/28/22  Yes Stehler, Pricilla Larsson, PA-C  amLODipine (NORVASC) 10 MG tablet Take 1 tablet (10 mg total) by mouth daily. 07/28/22  Yes Stehler, Pricilla Larsson, PA-C  aspirin EC 325 MG tablet Take 1 tablet (325 mg total) by mouth daily. 07/28/22  Yes Stehler, Pricilla Larsson, PA-C  atorvastatin (LIPITOR) 40 MG tablet Take 40 mg by mouth daily. 05/05/22  Yes [provider]  benazepril (LOTENSIN) 10 MG tablet Take 1 tablet (10 mg total) by mouth daily. 07/28/22  Yes Stehler, Pricilla Larsson, PA-C  empagliflozin (JARDIANCE) 25 MG TABS tablet Take 25 mg by mouth daily. 01/05/18  Yes [provider]  glimepiride (AMARYL) 2 MG tablet Take 2 mg by mouth every morning. 04/17/22  Yes [provider]  metFORMIN (GLUCOPHAGE) 500 MG tablet Take 1,000 mg by mouth 2 (two) times daily  with a meal.   Yes [provider]  metoprolol tartrate (LOPRESSOR) 50 MG tablet Take 1 tablet (50 mg total) by mouth 2 (two) times daily. 07/28/22  Yes Stehler, Pricilla Larsson, PA-C  VIAGRA 100 MG tablet Take 100 mg by mouth daily as needed for erectile dysfunction.  03/03/11  Yes [provider]  oxyCODONE (OXY IR/ROXICODONE) 5 MG immediate release tablet Take 1 tablet (5 mg total) by mouth every 6 (six) hours as needed for severe pain. Patient not taking: Reported on 08/21/2022 07/28/22   Magdalene River, PA-C    Inpatient Medications: Scheduled Meds:  amiodarone  200 mg Oral Daily   amLODipine  10 mg Oral Daily   benazepril  10 mg Oral Daily   insulin aspart  0-15 Units Subcutaneous Q4H   lip balm   Topical BID   metoprolol tartrate  50 mg Oral BID   sodium chloride flush  3 mL Intravenous Q12H   Continuous  Infusions:  0.9 % NaCl with KCl 20 mEq / L 100 mL/hr at 08/21/22 [id number]   lactated ringers     methocarbamol (ROBAXIN) IV     ondansetron (ZOFRAN) IV     piperacillin-tazobactam (ZOSYN)  IV 3.375 g (08/21/22 0847)   PRN Meds: acetaminophen, acetaminophen, alum & mag hydroxide-simeth, bisacodyl, HYDROmorphone (DILAUDID) injection, lactated ringers, magic mouthwash, menthol-cetylpyridinium, methocarbamol (ROBAXIN) IV, methocarbamol, metoprolol tartrate, ondansetron (ZOFRAN) IV **OR** ondansetron (ZOFRAN) IV, oxyCODONE, phenol, prochlorperazine, simethicone  Allergies:   No Known Allergies  Social History:   Social History   Socioeconomic History   Marital status: Married    Spouse name: Not on file   Number of children: 3   Years of education: Not on file   Highest education level: Not on file  Occupational History   Occupation: COO Catering manager  Tobacco Use   Smoking status: Never   Smokeless tobacco: Never  Vaping Use   Vaping Use: Never used  Substance and Sexual Activity   Alcohol use: Yes    Alcohol/week: 2.0 standard drinks of alcohol    Types: 2 Cans of beer per week    Comment: Weekly   Drug use: No   Sexual activity: Yes  Other Topics Concern   Not on file  Social History Narrative   Not on file   Social Determinants of Health   Financial Resource Strain: Not on file  Food Insecurity: No Food Insecurity (08/21/2022)   Hunger Vital Sign    Worried About Running Out of Food in the Last Year: Never true    Ran Out of Food in the Last Year: Never true  Transportation Needs: No Transportation Needs (08/21/2022)   PRAPARE - Hydrologist (Medical): No    Lack of Transportation (Non-Medical): No  Physical Activity: Not on file  Stress: Not on file  Social Connections: Not on file  Intimate Partner Violence: Not At Risk (08/21/2022)   Humiliation, Afraid, Rape, and Kick questionnaire    Fear of Current or Ex-Partner: No    Emotionally  Abused: No    Physically Abused: No    Sexually Abused: No    Family History:    Family History  Problem Relation Age of Onset   Cancer Father    Heart attack Sister      ROS:  Please see the history of present illness.   All other ROS reviewed and negative.     Physical Exam/Data:   Vitals:   08/21/22 0105  08/21/22 0210 08/21/22 0513 08/21/22 0915  BP: (!) 156/84 (!) 143/84 131/79 132/86  Pulse: 94 93 85 85  Resp: 20 18 18 19   Temp: 99.2 F (37.3 C) 98.5 F (36.9 C) 97.8 F (36.6 C) (!) 97.4 F (36.3 C)  TempSrc:   Oral Oral  SpO2: 96% 95% 93% 91%  Weight: 95.3 kg     Height: 6\' 2"  (1.88 m)       Intake/Output Summary (Last 24 hours) at 08/21/2022 0937 Last data filed at 08/21/2022 0926 Gross per 24 hour  Intake 1070.23 ml  Output 1100 ml  Net -29.77 ml      08/21/2022    1:05 AM 08/20/2022    4:48 PM 08/13/2022   10:12 AM  Last 3 Weights  Weight (lbs) 210 lb 1.6 oz 202 lb 208 lb  Weight (kg) 95.3 kg 91.627 kg 94.348 kg     Body mass index is 26.98 kg/m.  General:  Well nourished, well developed, in no acute distress.  Laying flat in the bed wearing nasal cannula HEENT: normal Neck: no JVD Vascular:  Radial pulses 2+ bilaterally Cardiac:  normal S1, S2; RRR; no murmur Lungs:  clear to auscultation bilaterally, no wheezing, rhonchi or rales.  Normal work of breathing on nasal cannula Abd: soft, nontender, no hepatomegaly  Ext: no edema in bilateral lower extremities Musculoskeletal:  No deformities, BUE and BLE strength normal and equal Skin: warm and dry  Neuro:  CNs 2-12 intact, no focal abnormalities noted Psych:  Normal affect   EKG:  The EKG was personally reviewed and demonstrates:  Normal sinus rhythm, heart rate 60 bpm, prolonged QT interval (QT/QTc B508/508) Telemetry:  Telemetry was personally reviewed and demonstrates: Normal sinus rhythm heart rate in the 80s  Relevant CV Studies:   Laboratory Data:  High Sensitivity Troponin:   Recent  Labs  Lab 08/20/22 1650  TROPONINIHS 4     Chemistry Recent Labs  Lab 08/20/22 1650 08/21/22 0453  NA 135 140  K 3.8 4.2  CL 100 102  CO2 26 25  GLUCOSE 135* 236*  BUN 17 17  CREATININE 1.21 1.11  CALCIUM 9.0 9.0  GFRNONAA >60 >60  ANIONGAP 9 13    Recent Labs  Lab 08/20/22 1650 08/21/22 0453  PROT 7.8 7.5  ALBUMIN 4.2 4.0  AST 20 23  ALT 26 25  ALKPHOS 115 100  BILITOT 1.2 1.4*   Lipids No results for input(s): "CHOL", "TRIG", "HDL", "LABVLDL", "LDLCALC", "CHOLHDL" in the last 168 hours.  Hematology Recent Labs  Lab 08/20/22 1650 08/21/22 0453  WBC 12.4* 19.0*  RBC 4.67 4.84  HGB 14.5 14.4  HCT 42.6 44.7  MCV 91.2 92.4  MCH 31.0 29.8  MCHC 34.0 32.2  RDW 13.3 13.5  PLT 210 189   Thyroid No results for input(s): "TSH", "FREET4" in the last 168 hours.  BNP Recent Labs  Lab 08/20/22 1650  BNP 94.7    DDimer No results for input(s): "DDIMER" in the last 168 hours.   Radiology/Studies:  US Abdomen Limited RUQ (LIVER/GB)  Result Date: 08/20/2022 CLINICAL DATA:  [id number] Epigastric pain [id number] EXAM: ULTRASOUND ABDOMEN LIMITED COMPARISON:  02/09/2007. FINDINGS: The liver demonstrates normal parenchymal echogenicity and homogeneous texture without focal hepatic parenchymal lesions or intrahepatic ductal dilatation. Trace fluid identified adjacent to the liver. Hepatopetal portal vein. Gallbladder is distended with evidence of sludge and focal wall thickening. No shadowing stones or pericholecystic fluid identified. To evaluate for cholecystitis consider HIDA scan. CBD measured  0.4cm. IMPRESSION: 1. Trace perihepatic fluid. 2. Distended gallbladder with wall thickening and echogenic sludge. 3. Findings suggest cholecystitis. Consider HIDA scan, if indicated, to further evaluate for cholecystitis. Electronically Signed   By: [name] M.D.   On: 08/20/2022 20:36   CT Angio Chest/Abd/Pel for Dissection W and/or W/WO  Result Date: 08/20/2022 CLINICAL DATA:   Acute aortic syndrome suspected. EXAM: CT ANGIOGRAPHY CHEST, ABDOMEN AND PELVIS TECHNIQUE: Non-contrast CT of the chest was initially obtained. Multidetector CT imaging through the chest, abdomen and pelvis was performed using the standard protocol during bolus administration of intravenous contrast. Multiplanar reconstructed images and MIPs were obtained and reviewed to evaluate the vascular anatomy. RADIATION DOSE REDUCTION: This exam was performed according to the departmental dose-optimization program which includes automated exposure control, adjustment of the mA and/or kV according to patient size and/or use of iterative reconstruction technique. CONTRAST:  185mL OMNIPAQUE IOHEXOL 350 MG/ML SOLN COMPARISON:  CT abdomen pelvis dated 02/19/2007. FINDINGS: CTA CHEST FINDINGS Cardiovascular: There is no cardiomegaly. Trace pericardial effusion, likely related to recent CABG. There is 3 vessel coronary vascular calcification. The thoracic aorta is unremarkable. The origins of the great vessels of the aortic arch and the central pulmonary arteries appear patent. Mediastinum/Nodes: No hilar or mediastinal adenopathy. The esophagus is grossly unremarkable. No mediastinal fluid collection. Lungs/Pleura: Minimal bibasilar dependent atelectasis. No focal consolidation, pleural effusion, or pneumothorax. The central airways are patent. Musculoskeletal: Median sternotomy wires. No acute osseous pathology. Review of the MIP images confirms the above findings. CTA ABDOMEN AND PELVIS FINDINGS VASCULAR Aorta: Mild aortoiliac atherosclerotic disease. No aneurysmal dilatation or dissection. No periaortic fluid collection. Celiac: Patent without evidence of aneurysm, dissection, vasculitis or significant stenosis. SMA: Patent without evidence of aneurysm, dissection, vasculitis or significant stenosis. Renals: The renal arteries are patent. Duplicated left renal artery narrowing. IMA: The IMA is patent. Inflow: Mild  atherosclerotic calcification. No aneurysmal dilatation. The iliac arteries are patent. Veins: No obvious venous abnormality within the limitations of this arterial phase study. Review of the MIP images confirms the above findings. NON-VASCULAR No intra-abdominal free air or free fluid. Hepatobiliary: The liver is unremarkable. No biliary ductal dilatation. No calcified gallstone or pericholecystic fluid. Minimal haziness adjacent to the gallbladder neck. Ultrasound may provide better evaluation if there is clinical concern for acute cholecystitis. Pancreas: Unremarkable. No pancreatic ductal dilatation or surrounding inflammatory changes. Spleen: Normal in size without focal abnormality. Adrenals/Urinary Tract: The adrenal glands unremarkable. The kidneys, visualized ureters, and the urinary bladder appear unremarkable. Stomach/Bowel: There is moderate stool throughout the colon. There is no bowel obstruction or active inflammation. Mild thickened appearance of the distal stomach likely related to underdistention. The appendix is normal. Lymphatic: No adenopathy. Reproductive: The prostate and seminal vesicles are grossly unremarkable. Other: None Musculoskeletal: Degenerative changes.  No acute osseous pathology. Review of the MIP images confirms the above findings. IMPRESSION: 1. No acute intrathoracic, abdominal, or pelvic pathology. No aortic aneurysm or dissection. 2. Moderate colonic stool burden. No bowel obstruction. Normal appendix. 3. Minimal haziness adjacent to the gallbladder neck. Ultrasound may provide better evaluation if there is clinical concern for acute cholecystitis. Electronically Signed   By: Anner Crete M.D.   On: 08/20/2022 19:51     Assessment and Plan:   Preoperative risk evaluation - Patient presented to the ED on 3/20 complaining of epigastric pain and nausea.  CTA was concerning for possible acute cholecystitis.  Right upper quadrant ultrasound showed a distended gallbladder  with wall thickening and echogenic sludge  concerning for acute cholecystitis - Patient was evaluated by general surgery who recommended laparoscopic cholecystectomy - According to the RCRI- patient is a class III risk (has a 10.1% risk of death, MI, or cardiac arrest). This is based on his history of ischemic heart disease and HFpEF  - Patient had CABG x4 on 07/23/22.  Since his CABG was only 1 month ago, this also increases his perioperative risks.  However, patient has recovered remarkably well since his CABG.  He is able to walk 3 miles without dizziness, chest pain, shortness of breath. - I discussed with patient and wife that his recent CABG and history of coronary artery disease, HFpEF do increase his perioperative risk.  However, patient currently does not have any modifiable risk factors. He is able to complete >4 mets of physical exertion without chest pain or SOB.  No plans for further cardiac evaluation prior to surgery  CAD s/p CABGx4 in 07/2022 - Patient had CABGx4 on 07/23/22 with LIMA-LAD, radial artery-PLV, SVG-PDA, SVG-diagonal on 07/23/2022 - ASA currently held prior to surgery. Would recommend continuing aspirin 81 mg daily perioperatively - Continue amlodipine 10 mg daily, metoprolol tartrate 50 mg daily  - Resume statin postoperatively   Postoperative atrial fibrillation  - Patient had a brief episode of atrial fibrillation after CABG, none since - Discontinue amiodarone 200 mg daily given prolonged QT.  Continue metoprolol tartrate 50 mg daily  - Per telemetry, patient is maintaining NSR   Prolonged QT - QT 508 on EKG yesterday.  Will monitor, would avoid QT prolonging agents, will hold amiodarone   Risk Assessment/Risk Scores:    For questions or updates, please contact [facility] Please consult [email or web address] for contact info under    Signed, Margie Billet, PA-C  08/21/2022 9:37 AM  Patient seen and examined.  Agree with above documentation.  [facility] is a 62 year old male with a history of CAD status post CABG 07/2022, postoperative atrial fibrillation, T2DM, hypertension, chronic diastolic heart failure we are consulted by Dr. Florene [facility] for preoperative evaluation prior to cholecystectomy.  Patient was initially seen by Dr. [facility] in cardiology in 05/2022, had been referred for elevated calcium score.  Echocardiogram 06/17/2022 showed EF 55 to 60%.  Lexiscan Myoview 07/02/2022 showed anterior and inferior perfusion defects.  LHC 07/10/2022 showed severe multivessel CAD.  He underwent CABG x 4 (LIMA-LAD, radial-PLV, SVG-PDA, SVG-diagonal) on 07/23/2022.  Postoperative course was complicated by atrial fibrillation, which was treated with amiodarone.  He reports that he has been doing well since his surgery, walking about 3 miles daily without any chest pain or shortness of breath.  He presented to the ED at [facility] on 3/20 with epigastric pain.  Vital signs notable for BP 155/92, SpO2 100% on room air, pulse 62.  Labs notable for creatinine 1.2, troponin 4, WBC 12.4, hemoglobin 14.5, platelets 210.  Right upper quadrant ultrasound showed distended gallbladder with wall thickening and echogenic sludge, findings suggesting cholecystitis.  EKG shows sinus rhythm, rate 60, nonspecific T wave flattening, QTc 508.  On exam, patient is diaphoretic, alert and oriented, regular rate and rhythm, no murmurs, lungs CTAB, no LE edema or JVD.  In regards to his preop evaluation, he underwent successful revascularization with CABG x 4 07/2022.  He is now walking 3 miles daily without any exertional symptoms.  Echocardiogram at time of surgery showed normal LV systolic function, no significant valvular disease.  No further workup recommended prior to surgery.  He is on aspirin  325 mg daily, can reduce dose to 81 mg daily, would continue perioperatively.  Hold off on amiodarone for now given prolonged QT interval on EKG.  Had brief postoperative A-fib after his CABG  but none since.  Will monitor for recurrent A-fib  Donato Heinz, MD

## 2022-08-21 NOTE — Progress Notes (Signed)
Luis Castillo  02-Dec-1960 [id number]  CARE TEAM:  PCP: Wenda Low, MD  Outpatient Care Team: Patient Care Team: Wenda Low, MD as PCP - General (Internal Medicine) O'Neal, Cassie Freer, MD as PCP - Cardiology (Cardiology) Lajuana Matte, MD as Consulting Physician (Cardiothoracic Surgery)  Inpatient Treatment Team: Treatment Team: Attending Provider: Elodia Florence., MD; Consulting Physician: Edison Pace, Md, MD; Registered Nurse: Blase Mess, RN; Rounding Team: Jackelyn Knife, MD   This patient is a 62 y.o.male who presents today for surgical evaluation at the request of Dr Melina Copa, [facility] ED.   Chief complaint / Reason for evaluation: Abdominal pain possible cholecystitis.  62 year old male.  Rather physically active.  Had a sibling with sudden cardiac arrest and had extensive workup to improve significant calcium score.  Underwent four-vessel bypass surgery less than a month ago.  Has been recovering.  Had some postoperative atrial fibrillation stable on amiodarone.  Diabetes on oral medication.  Developed sharp abdominal pain.  Mostly in the upper abdomen.  No relief with usual medications.  Significant pain.  Came emergency department.  Required repeated narcotics with some partial relief.  Workup suspicious is for cholecystitis.  Patient friend of Dr. Harlow Asa, my partner, who called from the ER with concerns as well.  He has some mild leukocytosis.  Not sharply severe.  No major biliary dilatation nor elevated LFTs or lipase concerning for gallstone pancreatitis or choledocholithiasis or cholangitis.    Assessment  VERDELL MACGREGOR  62 y.o. male       Problem List:  Principal Problem:   Acute cholecystitis Active Problems:   CAD s/p 4 vessel CABG 07/23/2022   Secondary hypertension   Diabetes mellitus type 2, noninsulin dependent (HCC)   Postoperative atrial fibrillation (HCC)   Sharp postprandial abdominal pain in the upper abdomen  suspicious for cholecystitis by CAT scan.  Plan:  Recommended IV antibiotics and bowel rest.  Given his recent cardiac surgery and other issues, requested medical admission with surgical consultation.  Transfer anticipated to [facility].  Once he arrives can consider evaluation for probable cholecystectomy.  History seems suspicious for biliary colic without any other etiology.  If more equivocal, will lean towards HIDA scan but seems pretty straightforward.  We will see.    I reviewed nursing notes, ED provider notes, Consultant cardiac surgery Dr. Kipp Brood notes, last 24 h vitals and pain scores, last 48 h intake and output, last 24 h labs and trends, and last 24 h imaging results. I have reviewed this patient's available data, including medical history, events of note, test results, etc as part of my evaluation.  A significant portion of that time was spent in counseling.  Care during the described time interval was provided by me.  This care required moderate level of medical decision making.  08/21/2022  Adin Hector, MD, FACS, MASCRS Esophageal, Gastrointestinal & Colorectal Surgery Robotic and Minimally Invasive Surgery  [facility] [place] [id number], [place], [place] [address] [phone] Fax [phone] Main  CONTACT INFORMATION:  Weekday (9AM-5PM): Call [facility] main office at [phone]  Weeknight (5PM-9AM) or Weekend/Holiday: Check [email or web address] (password " [email or web address]") for General Surgery [facility] coverage  (Please, do not use SecureChat as it is not reliable communication to reach operating surgeons for immediate patient care given surgeries/outpatient duties/clinic/cross-coverage/off post-call which would lead to a delay in care.  Epic staff messaging available for outptient concerns, but may not be answered for 48  hours or more).     08/21/2022      Past Medical History:  Diagnosis Date   Arthritis     Complication of anesthesia    Heart rate dropped with Knee surgery   Coronary artery disease    Diabetes mellitus without complication ([place])    Headache    History of blood transfusion    Hypertension     Past Surgical History:  Procedure Laterality Date   BACK SURGERY  2006   COLONOSCOPY WITH PROPOFOL N/A 11/10/2016   Procedure: COLONOSCOPY WITH PROPOFOL;  Surgeon: Garlan Fair, MD;  Location: [facility];  Service: Endoscopy;  Laterality: N/A;   CORONARY ARTERY BYPASS GRAFT N/A 07/23/2022   Procedure: CORONARY ARTERY BYPASS GRAFTING (CABG) X FOUR USING LEFT INTERNAL MAMMARY ARTERY, LEFT RADIAL ARTERY AND RIGHT GREATER SAPHENOUS VEIN HARVESTED ENDOSCOPICALLY.;  Surgeon: Lajuana Matte, MD;  Location: [place];  Service: Open Heart Surgery;  Laterality: N/A;   KNEE CARTILAGE SURGERY  2015   Both knees done.   LEFT HEART CATH AND CORONARY ANGIOGRAPHY N/A 07/10/2022   Procedure: LEFT HEART CATH AND CORONARY ANGIOGRAPHY;  Surgeon: Lorretta Harp, MD;  Location: [place] [facility];  Service: Cardiovascular;  Laterality: N/A;   RADIAL ARTERY HARVEST Left 07/23/2022   Procedure: RADIAL ARTERY HARVEST;  Surgeon: Lajuana Matte, MD;  Location: [place];  Service: Open Heart Surgery;  Laterality: Left;   TEE WITHOUT CARDIOVERSION N/A 07/23/2022   Procedure: TRANSESOPHAGEAL ECHOCARDIOGRAM;  Surgeon: Lajuana Matte, MD;  Location: [place];  Service: Open Heart Surgery;  Laterality: N/A;    Social History   Socioeconomic History   Marital status: Married    Spouse name: Not on file   Number of children: 3   Years of education: Not on file   Highest education level: Not on file  Occupational History   Occupation: COO Catering manager  Tobacco Use   Smoking status: Never   Smokeless tobacco: Never  Vaping Use   Vaping Use: Never used  Substance and Sexual Activity   Alcohol use: Yes    Alcohol/week: 2.0 standard drinks of alcohol    Types: 2 Cans of beer per week     Comment: Weekly   Drug use: No   Sexual activity: Yes  Other Topics Concern   Not on file  Social History Narrative   Not on file   Social Determinants of Health   Financial Resource Strain: Not on file  Food Insecurity: No Food Insecurity (08/21/2022)   Hunger Vital Sign    Worried About Running Out of Food in the Last Year: Never true    Ran Out of Food in the Last Year: Never true  Transportation Needs: No Transportation Needs (08/21/2022)   PRAPARE - Hydrologist (Medical): No    Lack of Transportation (Non-Medical): No  Physical Activity: Not on file  Stress: Not on file  Social Connections: Not on file  Intimate Partner Violence: Not At Risk (08/21/2022)   Humiliation, Afraid, Rape, and Kick questionnaire    Fear of Current or Ex-Partner: No    Emotionally Abused: No    Physically Abused: No    Sexually Abused: No    Family History  Problem Relation Age of Onset   Cancer Father    Heart attack Sister     Current Facility-Administered Medications  Medication Dose Route Frequency Provider Last Rate Last Admin   0.9 % NaCl with KCl 20  mEq/ L  infusion   Intravenous Continuous Quintella Baton, MD 100 mL/hr at 08/21/22 0223 New Bag at 08/21/22 0223   acetaminophen (TYLENOL) suppository 650 mg  650 mg Rectal Q6H PRN Michael Boston, MD       acetaminophen (TYLENOL) tablet 325-650 mg  325-650 mg Oral Q6H PRN Michael Boston, MD       alum & mag hydroxide-simeth (MAALOX/MYLANTA) 200-200-20 MG/5ML suspension 30 mL  30 mL Oral Q6H PRN Michael Boston, MD       bisacodyl (DULCOLAX) suppository 10 mg  10 mg Rectal Q12H PRN Michael Boston, MD       heparin injection 5,000 Units  5,000 Units Subcutaneous Q8H Samella Parr, NP       HYDROmorphone (DILAUDID) injection 0.5-2 mg  0.5-2 mg Intravenous Q2H PRN Michael Boston, MD   2 mg at 08/21/22 0509   insulin aspart (novoLOG) injection 0-15 Units  0-15 Units Subcutaneous Q4H Quintella Baton, MD   5 Units at  08/21/22 2248   lactated ringers bolus 1,000 mL  1,000 mL Intravenous Q8H PRN Michael Boston, MD       lip balm (CARMEX) ointment   Topical BID Michael Boston, MD       magic mouthwash  15 mL Oral QID PRN Michael Boston, MD       menthol-cetylpyridinium (CEPACOL) lozenge 3 mg  1 lozenge Oral PRN Michael Boston, MD       methocarbamol (ROBAXIN) 1,000 mg in dextrose 5 % 100 mL IVPB  1,000 mg Intravenous Q6H PRN Michael Boston, MD       methocarbamol (ROBAXIN) tablet 1,000 mg  1,000 mg Oral Q6H PRN Michael Boston, MD   1,000 mg at 08/21/22 0217   metoprolol tartrate (LOPRESSOR) injection 5 mg  5 mg Intravenous Q6H PRN Michael Boston, MD       ondansetron [facility]) injection 4 mg  4 mg Intravenous Q6H PRN Michael Boston, MD       Or   ondansetron (ZOFRAN) 8 mg in sodium chloride 0.9 % 50 mL IVPB  8 mg Intravenous Q6H PRN Michael Boston, MD       oxyCODONE (Oxy IR/ROXICODONE) immediate release tablet 5-10 mg  5-10 mg Oral Q4H PRN Michael Boston, MD   10 mg at 08/21/22 0609   phenol (CHLORASEPTIC) mouth spray 2 spray  2 spray Mouth/Throat PRN Michael Boston, MD       prochlorperazine (COMPAZINE) injection 5-10 mg  5-10 mg Intravenous Q4H PRN Michael Boston, MD       simethicone (MYLICON) 40 GN/0.0BB suspension 80 mg  80 mg Oral QID PRN Michael Boston, MD       sodium chloride flush (NS) 0.9 % injection 3 mL  3 mL Intravenous Q12H Samella Parr, NP         No Known Allergies   BP 131/79 (BP Location: Left Arm)   Pulse 85   Temp 97.8 F (36.6 C) (Oral)   Resp 18   Ht 6\' 2"  (1.88 m)   Wt 95.3 kg   SpO2 93%   BMI 26.98 kg/m     Results:   Labs: Results for orders placed or performed during the hospital encounter of 08/20/22 (from the past 48 hour(s))  Lipase, blood     Status: None   Collection Time: 08/20/22  4:50 PM  Result Value Ref Range   Lipase 26 11 - 51 U/L    Comment: Performed at [facility], [address] [place],  [place] [address]  Comprehensive metabolic panel      Status: Abnormal   Collection Time: 08/20/22  4:50 PM  Result Value Ref Range   Sodium 135 135 - 145 mmol/L   Potassium 3.8 3.5 - 5.1 mmol/L   Chloride 100 98 - 111 mmol/L   CO2 26 22 - 32 mmol/L   Glucose, Bld 135 (H) 70 - 99 mg/dL    Comment: Glucose reference range applies only to samples taken after fasting for at least 8 hours.   BUN 17 8 - 23 mg/dL   Creatinine, Ser 1.21 0.61 - 1.24 mg/dL   Calcium 9.0 8.9 - 10.3 mg/dL   Total Protein 7.8 6.5 - 8.1 g/dL   Albumin 4.2 3.5 - 5.0 g/dL   AST 20 15 - 41 U/L   ALT 26 0 - 44 U/L   Alkaline Phosphatase 115 38 - 126 U/L   Total Bilirubin 1.2 0.3 - 1.2 mg/dL   GFR, Estimated >60 >60 mL/min    Comment: (NOTE) Calculated using the CKD-EPI Creatinine Equation (2021)    Anion gap 9 5 - 15    Comment: Performed at [facility], [address] [place], [place] [address]  CBC     Status: Abnormal   Collection Time: 08/20/22  4:50 PM  Result Value Ref Range   WBC 12.4 (H) 4.0 - 10.5 K/uL   RBC 4.67 4.22 - 5.81 MIL/uL   Hemoglobin 14.5 13.0 - 17.0 g/dL   HCT 42.6 39.0 - 52.0 %   MCV 91.2 80.0 - 100.0 fL   MCH 31.0 26.0 - 34.0 pg   MCHC 34.0 30.0 - 36.0 g/dL   RDW 13.3 11.5 - 15.5 %   Platelets 210 150 - 400 K/uL   nRBC 0.0 0.0 - 0.2 %    Comment: Performed at [facility], [address] [place], [place] [address]  Urinalysis, Routine w reflex microscopic -Urine, Clean Catch     Status: Abnormal   Collection Time: 08/20/22  4:50 PM  Result Value Ref Range   Color, Urine YELLOW YELLOW   APPearance CLEAR CLEAR   Specific Gravity, Urine 1.020 1.005 - 1.030   pH 5.5 5.0 - 8.0   Glucose, UA >=500 (A) NEGATIVE mg/dL   Hgb urine dipstick NEGATIVE NEGATIVE   Bilirubin Urine NEGATIVE NEGATIVE   Ketones, ur NEGATIVE NEGATIVE mg/dL   Protein, ur NEGATIVE NEGATIVE mg/dL   Nitrite NEGATIVE NEGATIVE   Leukocytes,Ua NEGATIVE NEGATIVE    Comment: Performed at [facility], [address] [place], [place]  [address]  Troponin I (High Sensitivity)     Status: None   Collection Time: 08/20/22  4:50 PM  Result Value Ref Range   Troponin I (High Sensitivity) 4 <18 ng/L    Comment: (NOTE) Elevated high sensitivity troponin I (hsTnI) values and significant  changes across serial measurements may suggest ACS but many other  chronic and acute conditions are known to elevate hsTnI results.  Refer to the "Links" section for chest pain algorithms and additional  guidance. Performed at [facility], [address] [place], [place] [address]   Urinalysis, Microscopic (reflex)     Status: Abnormal   Collection Time: 08/20/22  4:50 PM  Result Value Ref Range   RBC / HPF 0-5 0 - 5 RBC/hpf   WBC, UA 0-5 0 - 5 WBC/hpf   Bacteria, UA RARE (A) NONE SEEN   Squamous Epithelial /  HPF 0-5 0 - 5 /HPF    Comment: Performed at [facility], [address] [place], [place] [address]  Brain natriuretic peptide     Status: None   Collection Time: 08/20/22  4:50 PM  Result Value Ref Range   B Natriuretic Peptide 94.7 0.0 - 100.0 pg/mL    Comment: Performed at [facility], [address] [place], [place] [address]  Glucose, capillary     Status: Abnormal   Collection Time: 08/21/22  2:11 AM  Result Value Ref Range   Glucose-Capillary 229 (H) 70 - 99 mg/dL    Comment: Glucose reference range applies only to samples taken after fasting for at least 8 hours.  Comprehensive metabolic panel     Status: Abnormal   Collection Time: 08/21/22  4:53 AM  Result Value Ref Range   Sodium 140 135 - 145 mmol/L   Potassium 4.2 3.5 - 5.1 mmol/L   Chloride 102 98 - 111 mmol/L   CO2 25 22 - 32 mmol/L   Glucose, Bld 236 (H) 70 - 99 mg/dL    Comment: Glucose reference range applies only to samples taken after fasting for at least 8 hours.   BUN 17 8 - 23 mg/dL   Creatinine, Ser 1.11 0.61 - 1.24 mg/dL   Calcium 9.0 8.9 - 10.3 mg/dL   Total Protein 7.5 6.5 - 8.1 g/dL   Albumin 4.0 3.5 - 5.0 g/dL    AST 23 15 - 41 U/L   ALT 25 0 - 44 U/L   Alkaline Phosphatase 100 38 - 126 U/L   Total Bilirubin 1.4 (H) 0.3 - 1.2 mg/dL   GFR, Estimated >60 >60 mL/min    Comment: (NOTE) Calculated using the CKD-EPI Creatinine Equation (2021)    Anion gap 13 5 - 15    Comment: Performed at [facility], [place] [address], [place], [place] [address]  CBC with Differential/Platelet     Status: Abnormal   Collection Time: 08/21/22  4:53 AM  Result Value Ref Range   WBC 19.0 (H) 4.0 - 10.5 K/uL   RBC 4.84 4.22 - 5.81 MIL/uL   Hemoglobin 14.4 13.0 - 17.0 g/dL   HCT 44.7 39.0 - 52.0 %   MCV 92.4 80.0 - 100.0 fL   MCH 29.8 26.0 - 34.0 pg   MCHC 32.2 30.0 - 36.0 g/dL   RDW 13.5 11.5 - 15.5 %   Platelets 189 150 - 400 K/uL   nRBC 0.0 0.0 - 0.2 %   Neutrophils Relative % 79 %   Neutro Abs 15.0 (H) 1.7 - 7.7 K/uL   Lymphocytes Relative 10 %   Lymphs Abs 1.8 0.7 - 4.0 K/uL   Monocytes Relative 10 %   Monocytes Absolute 2.0 (H) 0.1 - 1.0 K/uL   Eosinophils Relative 0 %   Eosinophils Absolute 0.0 0.0 - 0.5 K/uL   Basophils Relative 0 %   Basophils Absolute 0.1 0.0 - 0.1 K/uL   Immature Granulocytes 1 %   Abs Immature Granulocytes 0.14 (H) 0.00 - 0.07 K/uL    Comment: Performed at [facility], [place] [address], [place], [place] [address]  Protime-INR     Status: None   Collection Time: 08/21/22  4:53 AM  Result Value Ref Range   Prothrombin Time 14.9 11.4 - 15.2 seconds   INR 1.2 0.8 - 1.2    Comment: (NOTE) INR goal varies based on device and disease states. Performed  at [facility], [place] [address] [place], [place] [address]     Imaging / Studies: US Abdomen Limited RUQ (LIVER/GB)  Result Date: 08/20/2022 CLINICAL DATA:  [id number] Epigastric pain [id number] EXAM: ULTRASOUND ABDOMEN LIMITED COMPARISON:  02/09/2007. FINDINGS: The liver demonstrates normal parenchymal echogenicity and homogeneous texture without focal hepatic parenchymal lesions  or intrahepatic ductal dilatation. Trace fluid identified adjacent to the liver. Hepatopetal portal vein. Gallbladder is distended with evidence of sludge and focal wall thickening. No shadowing stones or pericholecystic fluid identified. To evaluate for cholecystitis consider HIDA scan. CBD measured 0.4cm. IMPRESSION: 1. Trace perihepatic fluid. 2. Distended gallbladder with wall thickening and echogenic sludge. 3. Findings suggest cholecystitis. Consider HIDA scan, if indicated, to further evaluate for cholecystitis. Electronically Signed   By: [name] M.D.   On: 08/20/2022 20:36   CT Angio Chest/Abd/Pel for Dissection W and/or W/WO  Result Date: 08/20/2022 CLINICAL DATA:  Acute aortic syndrome suspected. EXAM: CT ANGIOGRAPHY CHEST, ABDOMEN AND PELVIS TECHNIQUE: Non-contrast CT of the chest was initially obtained. Multidetector CT imaging through the chest, abdomen and pelvis was performed using the standard protocol during bolus administration of intravenous contrast. Multiplanar reconstructed images and MIPs were obtained and reviewed to evaluate the vascular anatomy. RADIATION DOSE REDUCTION: This exam was performed according to the departmental dose-optimization program which includes automated exposure control, adjustment of the mA and/or kV according to patient size and/or use of iterative reconstruction technique. CONTRAST:  128mL OMNIPAQUE IOHEXOL 350 MG/ML SOLN COMPARISON:  CT abdomen pelvis dated 02/19/2007. FINDINGS: CTA CHEST FINDINGS Cardiovascular: There is no cardiomegaly. Trace pericardial effusion, likely related to recent CABG. There is 3 vessel coronary vascular calcification. The thoracic aorta is unremarkable. The origins of the great vessels of the aortic arch and the central pulmonary arteries appear patent. Mediastinum/Nodes: No hilar or mediastinal adenopathy. The esophagus is grossly unremarkable. No mediastinal fluid collection. Lungs/Pleura: Minimal bibasilar dependent  atelectasis. No focal consolidation, pleural effusion, or pneumothorax. The central airways are patent. Musculoskeletal: Median sternotomy wires. No acute osseous pathology. Review of the MIP images confirms the above findings. CTA ABDOMEN AND PELVIS FINDINGS VASCULAR Aorta: Mild aortoiliac atherosclerotic disease. No aneurysmal dilatation or dissection. No periaortic fluid collection. Celiac: Patent without evidence of aneurysm, dissection, vasculitis or significant stenosis. SMA: Patent without evidence of aneurysm, dissection, vasculitis or significant stenosis. Renals: The renal arteries are patent. Duplicated left renal artery narrowing. IMA: The IMA is patent. Inflow: Mild atherosclerotic calcification. No aneurysmal dilatation. The iliac arteries are patent. Veins: No obvious venous abnormality within the limitations of this arterial phase study. Review of the MIP images confirms the above findings. NON-VASCULAR No intra-abdominal free air or free fluid. Hepatobiliary: The liver is unremarkable. No biliary ductal dilatation. No calcified gallstone or pericholecystic fluid. Minimal haziness adjacent to the gallbladder neck. Ultrasound may provide better evaluation if there is clinical concern for acute cholecystitis. Pancreas: Unremarkable. No pancreatic ductal dilatation or surrounding inflammatory changes. Spleen: Normal in size without focal abnormality. Adrenals/Urinary Tract: The adrenal glands unremarkable. The kidneys, visualized ureters, and the urinary bladder appear unremarkable. Stomach/Bowel: There is moderate stool throughout the colon. There is no bowel obstruction or active inflammation. Mild thickened appearance of the distal stomach likely related to underdistention. The appendix is normal. Lymphatic: No adenopathy. Reproductive: The prostate and seminal vesicles are grossly unremarkable. Other: None Musculoskeletal: Degenerative changes.  No acute osseous pathology. Review of the MIP images  confirms the above findings. IMPRESSION: 1. No acute intrathoracic, abdominal, or pelvic pathology. No  aortic aneurysm or dissection. 2. Moderate colonic stool burden. No bowel obstruction. Normal appendix. 3. Minimal haziness adjacent to the gallbladder neck. Ultrasound may provide better evaluation if there is clinical concern for acute cholecystitis. Electronically Signed   By: Anner Crete M.D.   On: 08/20/2022 19:51   DG Chest 2 View  Result Date: 07/27/2022 CLINICAL DATA:  Pleural effusion. EXAM: CHEST - 2 VIEW COMPARISON:  Chest x-rays dated 07/25/2022 and 07/23/2022. FINDINGS: RIGHT IJ Cordis has been removed. Median sternotomy wires appear intact and stable in alignment. Heart size and mediastinal contours are stable. Mild atelectasis at the LEFT lung base and small LEFT pleural effusion. RIGHT lung is clear. No pneumothorax is seen. IMPRESSION: Mild atelectasis at the LEFT lung base and small LEFT pleural effusion. Electronically Signed   By: Franki Cabot M.D.   On: 07/27/2022 08:38   DG Chest 2 View  Result Date: 07/25/2022 CLINICAL DATA:  Pneumothorax EXAM: CHEST - 2 VIEW COMPARISON:  AP chest 07/25/2022 at 5:43 a.m., 07/24/2022 FINDINGS: Right internal jugular central venous catheter sheath with indwelling catheter tip at the superior vena cava/right atrial junction, unchanged from prior. Status post median sternotomy and CABG. Cardiac silhouette is again at the upper limits of normal size for AP technique. Mediastinal contours are within normal limits. Mildly decreased lung volumes. Left midlung platelike atelectasis is similar to prior. Small left pleural effusion with mild left basilar retrocardiac opacity, unchanged. No pneumothorax is seen. No acute skeletal abnormality. IMPRESSION: 1. No pneumothorax is seen. 2. Small left pleural effusion, similar to prior. 3. Left mid and lower lung subsegmental atelectasis, similar to prior. Electronically Signed   By: Yvonne Kendall M.D.   On:  07/25/2022 13:18   DG Chest Port 1 View  Result Date: 07/25/2022 CLINICAL DATA:  Pleural effusion EXAM: PORTABLE CHEST 1 VIEW COMPARISON:  07/24/2022 FINDINGS: Right IJ central venous catheter remains in place. Stable cardiomediastinal contours status post sternotomy and CABG. Left greater than right bibasilar atelectasis with improving aeration from prior. No significant pleural fluid collection. No pneumothorax. IMPRESSION: Left greater than right bibasilar atelectasis with improving aeration from prior. Electronically Signed   By: Davina Poke D.O.   On: 07/25/2022 08:22   ECHO INTRAOPERATIVE TEE  Result Date: 07/24/2022  *INTRAOPERATIVE TRANSESOPHAGEAL REPORT *  Patient Name:   Luis Castillo Date of Exam: 07/23/2022 Medical Rec #:  [id number]       Height:       72.5 in Accession #:    [id number]      Weight:       213.0 lb Date of Birth:  1961/01/03      BSA:          2.20 m Patient Age:    34 years        BP:           162/96 mmHg Patient Gender: M               HR:           60 bpm. Exam Location:  Anesthesiology Transesophogeal exam was perform intraoperatively during surgical procedure. Patient was closely monitored under general anesthesia during the entirety of examination. Indications:     CAD Native Vessel i25.10 Sonographer:     Raquel Sarna Senior RDCS Performing Phys: [id number] [place] [place] LIGHTFOOT Diagnosing Phys: Albertha Ghee MD Complications: No known complications during this procedure. POST-OP IMPRESSIONS Overall, there were no significant changes from pre-bypass. PRE-OP FINDINGS  Left Ventricle: The left  ventricle has normal systolic function, with an ejection fraction of 55-60%. The cavity size was normal. There is mildly increased left ventricular wall thickness. There is mild concentric left ventricular hypertrophy. Right Ventricle: The right ventricle has normal systolic function. The cavity was normal. There is no increase in right ventricular wall thickness. Left Atrium: Left atrial  size was normal in size. No left atrial/left atrial appendage thrombus was detected. Right Atrium: Right atrial size was normal in size. Interatrial Septum: No atrial level shunt detected by color flow Doppler. Pericardium: There is no evidence of pericardial effusion. Mitral Valve: The mitral valve is normal in structure. Mitral valve regurgitation is trivial by color flow Doppler. There is No evidence of mitral stenosis. Tricuspid Valve: The tricuspid valve was normal in structure. Tricuspid valve regurgitation was not visualized by color flow Doppler. Aortic Valve: The aortic valve is normal in structure. Aortic valve regurgitation was not visualized by color flow Doppler. There is no stenosis of the aortic valve. Pulmonic Valve: The pulmonic valve was normal in structure. Pulmonic valve regurgitation is mild by color flow Doppler. Aorta: The aortic root, ascending aorta and aortic arch are normal in size and structure.  Albertha Ghee MD Electronically signed by Albertha Ghee MD Signature Date/Time: 07/24/2022/7:58:54 PM    Final    DG Chest Port 1 View  Result Date: 07/24/2022 CLINICAL DATA:  Status post CABG. EXAM: PORTABLE CHEST 1 VIEW COMPARISON:  Chest x-ray from yesterday. FINDINGS: Interval removal of the endotracheal and enteric tubes. Unchanged right internal jugular central venous catheter and mediastinal drain. Stable cardiomediastinal silhouette status post CABG. Continued low lung volumes. Persistent but mildly improved left basilar atelectasis. No pneumothorax or large pleural effusion. No acute osseous abnormality. IMPRESSION: 1. Interval extubation. Persistent but mildly improved left basilar atelectasis. Electronically Signed   By: Titus Dubin M.D.   On: 07/24/2022 08:35   DG Chest Port 1 View  Result Date: 07/23/2022 CLINICAL DATA:  Sixty-two year status post coronary graft EXAM: PORTABLE CHEST - 1 VIEW COMPARISON:  07/22/2018 FINDINGS: The mediastinal contours are within normal  limits. No cardiomegaly. Interval intubation with endotracheal tip approximally 1 cm above the carina. Right internal jugular central venous catheter in place with the catheter tip in the superior vena cava. Gastric decompression tube in place terminating off the inferior aspect of this image. Trace pneumomediastinum. Low lung volumes with bibasilar subsegmental atelectasis. No evidence of significant pleural effusion, focal consolidation, or pneumothorax. Median sternotomy wires in place. IMPRESSION: 1. Interval intubation with endotracheal tip approximally 1 cm above the carina. Recommend retraction by ~2 cm. 2. Low lung volumes with associated bibasilar subsegmental atelectasis. Electronically Signed   By: Ruthann Cancer M.D.   On: 07/23/2022 15:08   EP STUDY  Result Date: 07/23/2022 See surgical note for result.  DG Chest 2 View  Result Date: 07/23/2022 CLINICAL DATA:  Patient planned for multivessel CABG.  [id number]. EXAM: CHEST - 2 VIEW COMPARISON:  Partial chest CT for coronary calcium scoring dated 05/05/2022. FINDINGS: The heart size and mediastinal contours are within normal limits. Both lungs are clear. The visualized skeletal structures are intact, with moderate thoracic spondylosis. IMPRESSION: No acute radiographic chest findings. Electronically Signed   By: Telford Nab M.D.   On: 07/23/2022 06:12   VAS US DOPPLER PRE CABG  Result Date: 07/22/2022 PREOPERATIVE VASCULAR EVALUATION Patient Name:  KARTHIKEYA CORTEZ  Date of Exam:   07/22/2022 Medical Rec #: [id number]        Accession #:  [id number] Date of Birth: 08/24/1960       Patient Gender: M Patient Age:   40 years Exam Location:  [facility] Procedure:      VAS US DOPPLER PRE CABG Referring Phys: HARRELL LIGHTFOOT --------------------------------------------------------------------------------  Indications:      Pre-CABG. Risk Factors:     Hypertension, Diabetes, coronary artery disease. Comparison Study: No previous exams  Performing Technologist: Rogelia Rohrer RVT/RDMS  Examination Guidelines: A complete evaluation includes B-mode imaging, spectral Doppler, color Doppler, and power Doppler as needed of all accessible portions of each vessel. Bilateral testing is considered an integral part of a complete examination. Limited examinations for reoccurring indications may be performed as noted.  Right Carotid Findings: +----------+--------+--------+--------+--------+------------------+           PSV cm/sEDV cm/sStenosisDescribeComments           +----------+--------+--------+--------+--------+------------------+ CCA Prox  82      19                      intimal thickening +----------+--------+--------+--------+--------+------------------+ CCA Distal87      22                                         +----------+--------+--------+--------+--------+------------------+ ICA Prox  49      12                                         +----------+--------+--------+--------+--------+------------------+ ICA Distal62      20                                         +----------+--------+--------+--------+--------+------------------+ ECA       93      13                                         +----------+--------+--------+--------+--------+------------------+ +----------+--------+-------+--------+------------+           PSV cm/sEDV cmsDescribeArm Pressure +----------+--------+-------+--------+------------+ Subclavian73                                  +----------+--------+-------+--------+------------+ +---------+--------+--+--------+-+ VertebralPSV cm/s26EDV cm/s7 +---------+--------+--+--------+-+ Left Carotid Findings: +----------+--------+--------+--------+------------+------------------+           PSV cm/sEDV cm/sStenosisDescribe    Comments           +----------+--------+--------+--------+------------+------------------+ CCA Prox  108     23                          intimal  thickening +----------+--------+--------+--------+------------+------------------+ CCA Distal80      23                          intimal thickening +----------+--------+--------+--------+------------+------------------+ ICA Prox  52      23              heterogenous                   +----------+--------+--------+--------+------------+------------------+ ICA Distal58      25                                             +----------+--------+--------+--------+------------+------------------+  ECA       103     12                                             +----------+--------+--------+--------+------------+------------------+ +----------+--------+--------+--------+------------+ SubclavianPSV cm/sEDV cm/sDescribeArm Pressure +----------+--------+--------+--------+------------+           98                                   +----------+--------+--------+--------+------------+ +---------+--------+--+--------+--+ VertebralPSV cm/s30EDV cm/s12 +---------+--------+--+--------+--+  ABI Findings: +---------+------------------+-----+---------+--------+ Right    Rt Pressure (mmHg)IndexWaveform Comment  +---------+------------------+-----+---------+--------+ Brachial 131                    triphasic         +---------+------------------+-----+---------+--------+ PTA      166               1.24 triphasic         +---------+------------------+-----+---------+--------+ PERO     150               1.12 triphasic         +---------+------------------+-----+---------+--------+ Great Toe100               0.75 Normal            +---------+------------------+-----+---------+--------+ +---------+------------------+-----+---------+-------+ Left     Lt Pressure (mmHg)IndexWaveform Comment +---------+------------------+-----+---------+-------+ Brachial 134                    triphasic        +---------+------------------+-----+---------+-------+ PTA       167               1.25 triphasic        +---------+------------------+-----+---------+-------+ PERO     160               1.19 triphasic        +---------+------------------+-----+---------+-------+ Great Toe104               0.78 Normal           +---------+------------------+-----+---------+-------+ +-------+---------------+----------------+ ABI/TBIToday's ABI/TBIPrevious ABI/TBI +-------+---------------+----------------+ Right  1.24 / 0.75                     +-------+---------------+----------------+ Left   1.25 / 0.78                     +-------+---------------+----------------+  Right Doppler Findings: +--------+--------+-----+---------+--------+ Site    PressureIndexDoppler  Comments +--------+--------+-----+---------+--------+ [id number]          triphasic         +--------+--------+-----+---------+--------+ Radial               triphasic         +--------+--------+-----+---------+--------+ Ulnar                triphasic         +--------+--------+-----+---------+--------+  Left Doppler Findings: +--------+--------+-----+---------+--------+ Site    PressureIndexDoppler  Comments +--------+--------+-----+---------+--------+ [id number]          triphasic         +--------+--------+-----+---------+--------+ Radial               triphasic         +--------+--------+-----+---------+--------+ Ulnar  triphasic         +--------+--------+-----+---------+--------+   Summary: Right Carotid: The extracranial vessels were near-normal with only minimal wall                thickening or plaque. Left Carotid: The extracranial vessels were near-normal with only minimal wall               thickening or plaque. Vertebrals:  Bilateral vertebral arteries demonstrate antegrade flow. Subclavians: Normal flow hemodynamics were seen in bilateral subclavian              arteries. Right ABI: Resting right ankle-brachial index is within  normal range. The right toe-brachial index is normal. Left ABI: Resting left ankle-brachial index is within normal range. The left toe-brachial index is normal. Bilateral Extremity: Doppler waveforms remain within normal limits with compression bilaterally for the radial arteries. Doppler waveforms remain within normal limits with compression bilaterally for the ulnar arteries.  Electronically signed by Deitra Mayo MD on 07/22/2022 at 6:01:53 PM.    Final     Medications / Allergies: per chart  Antibiotics: Anti-infectives (From admission, onward)    Start     Dose/Rate Route Frequency Ordered Stop   08/20/22 2200  cefTRIAXone (ROCEPHIN) 2 g in sodium chloride 0.9 % 100 mL IVPB  Status:  Discontinued       Note to Pharmacy: Pharmacy may adjust dosing strength / duration / interval for maximal efficacy   2 g 200 mL/hr over 30 Minutes Intravenous Every 24 hours 08/20/22 2101 08/21/22 [id number]         Note: Portions of this report may have been transcribed using voice recognition software. Every effort was made to ensure accuracy; however, inadvertent computerized transcription errors may be present.   Any transcriptional errors that result from this process are unintentional.    Adin Hector, MD, FACS, MASCRS Esophageal, Gastrointestinal & Colorectal Surgery Robotic and Minimally Invasive Surgery  [facility]. [address] [place], [place] [address] [phone] Fax [phone] Main  CONTACT INFORMATION:  Weekday (9AM-5PM): Call [facility] main office at [phone]  Weeknight (5PM-9AM) or Weekend/Holiday: Check [email or web address] (password " [email or web address]") for General Surgery [facility] coverage  (Please, do not use SecureChat as it is not reliable communication to reach operating surgeons for immediate patient care given surgeries/outpatient duties/clinic/cross-coverage/off post-call which would lead to a delay in care.  Epic  staff messaging available for outptient concerns, but may not be answered for 48 hours or more).      08/21/2022  6:57 AM

## 2022-08-21 NOTE — H&P (Addendum)
History and Physical    Patient: Luis Castillo [id number] DOB: 1961-04-29 DOA: 08/20/2022 DOS: the patient was seen and examined on 08/21/2022 PCP: Wenda Low, MD  Patient coming from: Home with wife  Chief Complaint:  Chief Complaint  Patient presents with   Abdominal Pain   HPI: Luis Castillo is a 62 y.o. male with medical history significant of CAD with recent CABG procedure February 2024, diabetes mellitus on oral agents, history of postoperative atrial fibrillation on amiodarone, hypertension, dyslipidemia, HFpEF and mild osteoarthritis.  Patient reports that he had been doing extremely well since his recent CABG procedure.  Yesterday he returned to work for the first time.  His wife drove him to work.  He works in [employer].  He began developing severe indigestion that later transition to severe epigastric pain.  He attempted to treat the pain with Tylenol and Pepto-Bismol but this did not help.  He has not had any abdominal surgeries.  He called his wife to pick him up and they made it as far as [employer] before he decided he needed to seek attention at an emergency department.  By that time the pain had progressed to severe in the epigastrium/right upper quadrant region.  He denied any fevers or chills.  He did have some nausea and reflux symptoms but no vomiting.  He has not had any chest pain or shortness of breath.  He was initially evaluated at [facility] ED.  He was afebrile and mildly hypertensive without tachycardia.  His room air sats were 100%.  Unfortunately his pain continue to progress and he began to have splinting type respirations because of the pain and his O2 sats began to drop and he was placed on nasal cannula oxygen.  His initial labs revealed a white count of 12,400.  BNP troponin were normal.  Urinalysis unremarkable except for glycosuria greater than 500.  Initial LFTs were normal.  Because of his hypoxemia CTA of the chest was  obtained that did not show any pulmonary abnormalities but did reveal findings concerning for possible acute cholecystitis and an ultrasound was recommended.  The ultrasound did reveal a distended gallbladder with wall thickening and echogenic sludge concerning for likely acute cholecystitis.  There was also trace perihepatic fluid.  Labs were repeated and total bilirubin had increased from 1.2-1.4.  LFTs and alkaline phos remain normal.  White count had increased to 19,000.  He had previously received a dose of Rocephin.  General surgery was consulted and plans are for patient to be formally seen this morning.  During my evaluation of the patient he confirmed the above history.  He continued to have significant abdominal pain on exam and was requiring oxygen and he confirms he was having difficulty taking in a deep breath due to his abdominal pain.  After I completed my examination of the patient Dr. Marcello Moores with general surgery came into the room to complete her exam.  Review of Systems: As mentioned in the history of present illness. All other systems reviewed and are negative. Past Medical History:  Diagnosis Date   Arthritis    Complication of anesthesia    Heart rate dropped with Knee surgery   Coronary artery disease    Diabetes mellitus without complication ([place])    Headache    History of blood transfusion    Hypertension    Past Surgical History:  Procedure Laterality Date   BACK SURGERY  2006   COLONOSCOPY WITH PROPOFOL N/A 11/10/2016  Procedure: COLONOSCOPY WITH PROPOFOL;  Surgeon: Garlan Fair, MD;  Location: [facility];  Service: Endoscopy;  Laterality: N/A;   CORONARY ARTERY BYPASS GRAFT N/A 07/23/2022   Procedure: CORONARY ARTERY BYPASS GRAFTING (CABG) X FOUR USING LEFT INTERNAL MAMMARY ARTERY, LEFT RADIAL ARTERY AND RIGHT GREATER SAPHENOUS VEIN HARVESTED ENDOSCOPICALLY.;  Surgeon: Lajuana Matte, MD;  Location: [facility];  Service: Open Heart Surgery;  Laterality: N/A;    KNEE CARTILAGE SURGERY  2015   Both knees done.   LEFT HEART CATH AND CORONARY ANGIOGRAPHY N/A 07/10/2022   Procedure: LEFT HEART CATH AND CORONARY ANGIOGRAPHY;  Surgeon: Lorretta Harp, MD;  Location: [facility];  Service: Cardiovascular;  Laterality: N/A;   RADIAL ARTERY HARVEST Left 07/23/2022   Procedure: RADIAL ARTERY HARVEST;  Surgeon: Lajuana Matte, MD;  Location: [place];  Service: Open Heart Surgery;  Laterality: Left;   TEE WITHOUT CARDIOVERSION N/A 07/23/2022   Procedure: TRANSESOPHAGEAL ECHOCARDIOGRAM;  Surgeon: Lajuana Matte, MD;  Location: [place];  Service: Open Heart Surgery;  Laterality: N/A;   Social History:  reports that he has never smoked. He has never used smokeless tobacco. He reports current alcohol use of about 2.0 standard drinks of alcohol per week. He reports that he does not use drugs.  No Known Allergies  Family History  Problem Relation Age of Onset   Cancer Father    Heart attack Sister     Prior to Admission medications   Medication Sig Start Date End Date Taking? Authorizing Provider  amiodarone (PACERONE) 200 MG tablet Take 2 tabs twice per day for 7 days, then take 1 tab twice per day for 7 days, then take 1 tab once per day thereafter 07/28/22   Thomasenia Sales, Pricilla Larsson, PA-C  amLODipine (NORVASC) 10 MG tablet Take 1 tablet (10 mg total) by mouth daily. 07/28/22   Stehler, Pricilla Larsson, PA-C  aspirin EC 325 MG tablet Take 1 tablet (325 mg total) by mouth daily. 07/28/22   Stehler, Pricilla Larsson, PA-C  atorvastatin (LIPITOR) 40 MG tablet Take 40 mg by mouth daily. 05/05/22   [provider]  benazepril (LOTENSIN) 10 MG tablet Take 1 tablet (10 mg total) by mouth daily. 07/28/22   Stehler, Pricilla Larsson, PA-C  empagliflozin (JARDIANCE) 25 MG TABS tablet Take 25 mg by mouth daily. 01/05/18   [provider]  glimepiride (AMARYL) 2 MG tablet Take 2 mg by mouth every morning. 04/17/22   [provider]  metFORMIN (GLUCOPHAGE) 500 MG tablet  Take 1,000 mg by mouth 2 (two) times daily with a meal.    [provider]  metoprolol tartrate (LOPRESSOR) 50 MG tablet Take 1 tablet (50 mg total) by mouth 2 (two) times daily. 07/28/22   Stehler, Pricilla Larsson, PA-C  oxyCODONE (OXY IR/ROXICODONE) 5 MG immediate release tablet Take 1 tablet (5 mg total) by mouth every 6 (six) hours as needed for severe pain. 07/28/22   Stehler, Pricilla Larsson, PA-C  VIAGRA 100 MG tablet Take 100 mg by mouth daily as needed for erectile dysfunction.  03/03/11   [provider]    Physical Exam: Vitals:   08/21/22 0000 08/21/22 0105 08/21/22 0210 08/21/22 0513  BP: (!) 153/84 (!) 156/84 (!) 143/84 131/79  Pulse: 93 94 93 85  Resp:  20 18 18   Temp:  99.2 F (37.3 C) 98.5 F (36.9 C) 97.8 F (36.6 C)  TempSrc:    Oral  SpO2: 95% 96% 95% 93%  Weight:  95.3  kg    Height:  6\' 2"  (1.88 m)     Constitutional: NAD, calm, comfortable Eyes: PERRL, lids and conjunctivae normal ENMT: Mucous membranes are moist. Posterior pharynx clear of any exudate or lesions.Normal dentition.  Neck: normal, supple, no masses, no thyromegaly Respiratory: clear to auscultation bilaterally, no wheezing, no crackles. Normal respiratory effort. No accessory muscle use.  Cardiovascular: Regular rate and rhythm, no murmurs / rubs / gallops. No extremity edema. 2+ pedal pulses.  Sternal incision healing.  Abdomen: Focally tender over the epigastrium but more tender with guarding over the right upper quadrant region.  No masses palpated.  Given the degree of abdominal pain unable to appreciate any hepatosplenomegaly since patient could not tolerate percussion.Bowel sounds positive.  Musculoskeletal: no clubbing / cyanosis. No joint deformity upper and lower extremities. Good ROM, no contractures. Normal muscle tone.  Skin: no rashes, lesions, ulcers. No induration Neurologic: CN 2-12 grossly intact. Sensation intact, DTR normal. Strength 5/5 x all 4 extremities.  Psychiatric: Normal  judgment and insight. Alert and oriented x 3. Normal mood.   Data Reviewed:  As per HPI  Assessment and Plan: Acute cholecystitis Patient presents with near classic symptoms and continues to have significant abdominal pain to the point he is splinting and requiring oxygen for mild hypoxemia Appreciate surgical team assistance Given patient's comorbidity of diabetes and recent cardiac surgery as well as progressive leukocytosis decision made to change antibiotics from Rocephin to Zosyn-does not meet sepsis criteria but does require continued monitoring for potential evolution to sepsis Continue IV Dilaudid for pain n.p.o., IV fluids, IV antiemetics Lee criteria: High risk (+ CAD/recent CABG, diabetes mellitus, HFpEF) therefore we will consult cardiology for surgical clearance  Acute hypoxemia Patient developed decreasing sats after presentation. He reported that due to abdominal pain he was having difficulty taking in a deep breath and I suspect splinting is the primary cause for his hypoxemia CTA of the chest showed no acute pulmonary issues to explain his hypoxia  Recent CABG procedure Has done well from a postoperative standpoint Can resume statin (Lipitor) and aspirin in the postoperative setting Continue beta-blocker  Diabetes mellitus on oral agents w/ hyperglycemia N.p.o. so we will follow CBGs every 4 hours with SSI Holding home Jardiance, Amaryl and metformin for now (patient also received IV contrast so will need to hold metformin a total of 48 hours from date of CT angio on 2/20) Hemoglobin A1c was 7.6 on 07/22/2022  Postoperative atrial fibrillation Continue amiodarone and metoprolol Telemetry monitoring  Hypertension Continue preadmission Lotensin, metoprolol and amlodipine as blood pressure tolerates Current blood pressure 131/79 but has been in the hypertensive range in the context of ongoing pain     Advance Care Planning:   Code Status: Full Code   DVT  prophylaxis: SCDs.  Can transition to subcutaneous heparin or Lovenox at discretion of surgical team postoperatively  Consults: General surgery, cardiology  Family Communication: Patient only  Severity of Illness: The appropriate patient status for this patient is INPATIENT. Inpatient status is judged to be reasonable and necessary in order to provide the required intensity of service to ensure the patient's safety. The patient's presenting symptoms, physical exam findings, and initial radiographic and laboratory data in the context of their chronic comorbidities is felt to place them at high risk for further clinical deterioration. Furthermore, it is not anticipated that the patient will be medically stable for discharge from the hospital within 2 midnights of admission.   * I certify that at the point  of admission it is my clinical judgment that the patient will require inpatient hospital care spanning beyond 2 midnights from the point of admission due to high intensity of service, high risk for further deterioration and high frequency of surveillance required.*  Author: Erin Hearing, NP 08/21/2022 6:54 AM  For on call review [email or web address].

## 2022-08-21 NOTE — Anesthesia Postprocedure Evaluation (Signed)
Anesthesia Post Note  Patient: Luis Castillo  Procedure(s) Performed: LAPAROSCOPIC CHOLECYSTECTOMY     Patient location during evaluation: PACU Anesthesia Type: General Level of consciousness: awake and alert, patient cooperative and oriented Pain management: pain level controlled Vital Signs Assessment: post-procedure vital signs reviewed and stable Respiratory status: spontaneous breathing, nonlabored ventilation and respiratory function stable Cardiovascular status: blood pressure returned to baseline and stable Postop Assessment: no apparent nausea or vomiting Anesthetic complications: no   No notable events documented.  Last Vitals:  Vitals:   08/21/22 1545 08/21/22 1557  BP: 127/86   Pulse: (!) 58 63  Resp: (!) 5 18  Temp:    SpO2: 97% 99%    Last Pain:  Vitals:   08/21/22 1557  TempSrc:   PainSc: 4                  Shanah Guimaraes,E. Levern Pitter

## 2022-08-21 NOTE — Consult Note (Signed)
CC: abd pain  Requesting provider: Dr Florene Glen  HPI: Luis Castillo is an 62 y.o. male who is here for acute onset right upper quadrant pain that started yesterday morning.  He describes this as a slight hunger pain that slowly increased in intensity to severe epigastric pain.  He called his medical doctors who recommended evaluation in the emergency department.  He reports some nausea as well.  He was seen at [facility] and ultrasound showed a distended gallbladder with wall thickening concerning for acute cholecystitis.  LFTs are relatively normal.  White count was 19.  He has received IV antibiotics as well.  Transferred here for surgical management.  The patient is approximately 3 to 4 weeks status post CABG.  Past Medical History:  Diagnosis Date   Arthritis    Complication of anesthesia    Heart rate dropped with Knee surgery   Coronary artery disease    Diabetes mellitus without complication ([place])    Headache    History of blood transfusion    Hypertension     Past Surgical History:  Procedure Laterality Date   BACK SURGERY  2006   COLONOSCOPY WITH PROPOFOL N/A 11/10/2016   Procedure: COLONOSCOPY WITH PROPOFOL;  Surgeon: Garlan Fair, MD;  Location: [facility];  Service: Endoscopy;  Laterality: N/A;   CORONARY ARTERY BYPASS GRAFT N/A 07/23/2022   Procedure: CORONARY ARTERY BYPASS GRAFTING (CABG) X FOUR USING LEFT INTERNAL MAMMARY ARTERY, LEFT RADIAL ARTERY AND RIGHT GREATER SAPHENOUS VEIN HARVESTED ENDOSCOPICALLY.;  Surgeon: Lajuana Matte, MD;  Location: [place];  Service: Open Heart Surgery;  Laterality: N/A;   KNEE CARTILAGE SURGERY  2015   Both knees done.   LEFT HEART CATH AND CORONARY ANGIOGRAPHY N/A 07/10/2022   Procedure: LEFT HEART CATH AND CORONARY ANGIOGRAPHY;  Surgeon: Lorretta Harp, MD;  Location: [place] [facility];  Service: Cardiovascular;  Laterality: N/A;   RADIAL ARTERY HARVEST Left 07/23/2022   Procedure: RADIAL ARTERY HARVEST;   Surgeon: Lajuana Matte, MD;  Location: [place];  Service: Open Heart Surgery;  Laterality: Left;   TEE WITHOUT CARDIOVERSION N/A 07/23/2022   Procedure: TRANSESOPHAGEAL ECHOCARDIOGRAM;  Surgeon: Lajuana Matte, MD;  Location: [place];  Service: Open Heart Surgery;  Laterality: N/A;    Family History  Problem Relation Age of Onset   Cancer Father    Heart attack Sister     Social:  reports that he has never smoked. He has never used smokeless tobacco. He reports current alcohol use of about 2.0 standard drinks of alcohol per week. He reports that he does not use drugs.  Allergies: No Known Allergies  Medications: I have reviewed the patient's current medications.  Results for orders placed or performed during the hospital encounter of 08/20/22 (from the past 48 hour(s))  Lipase, blood     Status: None   Collection Time: 08/20/22  4:50 PM  Result Value Ref Range   Lipase 26 11 - 51 U/L    Comment: Performed at [facility] [address] [place], [place] [address]  Comprehensive metabolic panel     Status: Abnormal   Collection Time: 08/20/22  4:50 PM  Result Value Ref Range   Sodium 135 135 - 145 mmol/L   Potassium 3.8 3.5 - 5.1 mmol/L   Chloride 100 98 - 111 mmol/L   CO2 26 22 - 32 mmol/L   Glucose, Bld 135 (H) 70 - 99 mg/dL  Comment: Glucose reference range applies only to samples taken after fasting for at least 8 hours.   BUN 17 8 - 23 mg/dL   Creatinine, Ser 1.21 0.61 - 1.24 mg/dL   Calcium 9.0 8.9 - 10.3 mg/dL   Total Protein 7.8 6.5 - 8.1 g/dL   Albumin 4.2 3.5 - 5.0 g/dL   AST 20 15 - 41 U/L   ALT 26 0 - 44 U/L   Alkaline Phosphatase 115 38 - 126 U/L   Total Bilirubin 1.2 0.3 - 1.2 mg/dL   GFR, Estimated >60 >60 mL/min    Comment: (NOTE) Calculated using the CKD-EPI Creatinine Equation (2021)    Anion gap 9 5 - 15    Comment: Performed at [facility], [address] [place], [place] [address]  CBC     Status: Abnormal    Collection Time: 08/20/22  4:50 PM  Result Value Ref Range   WBC 12.4 (H) 4.0 - 10.5 K/uL   RBC 4.67 4.22 - 5.81 MIL/uL   Hemoglobin 14.5 13.0 - 17.0 g/dL   HCT 42.6 39.0 - 52.0 %   MCV 91.2 80.0 - 100.0 fL   MCH 31.0 26.0 - 34.0 pg   MCHC 34.0 30.0 - 36.0 g/dL   RDW 13.3 11.5 - 15.5 %   Platelets 210 150 - 400 K/uL   nRBC 0.0 0.0 - 0.2 %    Comment: Performed at [facility], [address] [place], [place] [address]  Urinalysis, Routine w reflex microscopic -Urine, Clean Catch     Status: Abnormal   Collection Time: 08/20/22  4:50 PM  Result Value Ref Range   Color, Urine YELLOW YELLOW   APPearance CLEAR CLEAR   Specific Gravity, Urine 1.020 1.005 - 1.030   pH 5.5 5.0 - 8.0   Glucose, UA >=500 (A) NEGATIVE mg/dL   Hgb urine dipstick NEGATIVE NEGATIVE   Bilirubin Urine NEGATIVE NEGATIVE   Ketones, ur NEGATIVE NEGATIVE mg/dL   Protein, ur NEGATIVE NEGATIVE mg/dL   Nitrite NEGATIVE NEGATIVE   Leukocytes,Ua NEGATIVE NEGATIVE    Comment: Performed at [facility], [address] [place], [place] [address]  Troponin I (High Sensitivity)     Status: None   Collection Time: 08/20/22  4:50 PM  Result Value Ref Range   Troponin I (High Sensitivity) 4 <18 ng/L    Comment: (NOTE) Elevated high sensitivity troponin I (hsTnI) values and significant  changes across serial measurements may suggest ACS but many other  chronic and acute conditions are known to elevate hsTnI results.  Refer to the "Links" section for chest pain algorithms and additional  guidance. Performed at [facility], [address] [place], [place] [address]   Urinalysis, Microscopic (reflex)     Status: Abnormal   Collection Time: 08/20/22  4:50 PM  Result Value Ref Range   RBC / HPF 0-5 0 - 5 RBC/hpf   WBC, UA 0-5 0 - 5 WBC/hpf   Bacteria, UA RARE (A) NONE SEEN   Squamous Epithelial / HPF 0-5 0 - 5 /HPF    Comment: Performed at [facility], [address],  [place], [place] [address]  Brain natriuretic peptide     Status: None   Collection Time: 08/20/22  4:50 PM  Result Value Ref Range   B Natriuretic Peptide 94.7 0.0 - 100.0 pg/mL    Comment: Performed at [facility], [address] [place], [place] [address]  Glucose, capillary     Status: Abnormal   Collection Time: 08/21/22  2:11 AM  Result Value Ref Range   Glucose-Capillary 229 (H) 70 - 99 mg/dL    Comment: Glucose reference range applies only to samples taken after fasting for at least 8 hours.  Comprehensive metabolic panel     Status: Abnormal   Collection Time: 08/21/22  4:53 AM  Result Value Ref Range   Sodium 140 135 - 145 mmol/L   Potassium 4.2 3.5 - 5.1 mmol/L   Chloride 102 98 - 111 mmol/L   CO2 25 22 - 32 mmol/L   Glucose, Bld 236 (H) 70 - 99 mg/dL    Comment: Glucose reference range applies only to samples taken after fasting for at least 8 hours.   BUN 17 8 - 23 mg/dL   Creatinine, Ser 1.11 0.61 - 1.24 mg/dL   Calcium 9.0 8.9 - 10.3 mg/dL   Total Protein 7.5 6.5 - 8.1 g/dL   Albumin 4.0 3.5 - 5.0 g/dL   AST 23 15 - 41 U/L   ALT 25 0 - 44 U/L   Alkaline Phosphatase 100 38 - 126 U/L   Total Bilirubin 1.4 (H) 0.3 - 1.2 mg/dL   GFR, Estimated >60 >60 mL/min    Comment: (NOTE) Calculated using the CKD-EPI Creatinine Equation (2021)    Anion gap 13 5 - 15    Comment: Performed at [facility], [place] [address] [place], [place] [address]  CBC with Differential/Platelet     Status: Abnormal   Collection Time: 08/21/22  4:53 AM  Result Value Ref Range   WBC 19.0 (H) 4.0 - 10.5 K/uL   RBC 4.84 4.22 - 5.81 MIL/uL   Hemoglobin 14.4 13.0 - 17.0 g/dL   HCT 44.7 39.0 - 52.0 %   MCV 92.4 80.0 - 100.0 fL   MCH 29.8 26.0 - 34.0 pg   MCHC 32.2 30.0 - 36.0 g/dL   RDW 13.5 11.5 - 15.5 %   Platelets 189 150 - 400 K/uL   nRBC 0.0 0.0 - 0.2 %   Neutrophils Relative % 79 %   Neutro Abs 15.0 (H) 1.7 - 7.7 K/uL   Lymphocytes Relative 10 %   Lymphs Abs  1.8 0.7 - 4.0 K/uL   Monocytes Relative 10 %   Monocytes Absolute 2.0 (H) 0.1 - 1.0 K/uL   Eosinophils Relative 0 %   Eosinophils Absolute 0.0 0.0 - 0.5 K/uL   Basophils Relative 0 %   Basophils Absolute 0.1 0.0 - 0.1 K/uL   Immature Granulocytes 1 %   Abs Immature Granulocytes 0.14 (H) 0.00 - 0.07 K/uL    Comment: Performed at [facility], [place] [address] [place], [place] [address]  Protime-INR     Status: None   Collection Time: 08/21/22  4:53 AM  Result Value Ref Range   Prothrombin Time 14.9 11.4 - 15.2 seconds   INR 1.2 0.8 - 1.2    Comment: (NOTE) INR goal varies based on device and disease states. Performed at [facility], [place] [address], [place], [place] [address]   Glucose, capillary     Status: Abnormal   Collection Time: 08/21/22  7:12 AM  Result Value Ref Range   Glucose-Capillary 201 (H) 70 - 99 mg/dL    Comment: Glucose reference range applies only to samples taken after fasting for at least 8 hours.    US Abdomen Limited  RUQ (LIVER/GB)  Result Date: 08/20/2022 CLINICAL DATA:  [id number] Epigastric pain [id number] EXAM: ULTRASOUND ABDOMEN LIMITED COMPARISON:  02/09/2007. FINDINGS: The liver demonstrates normal parenchymal echogenicity and homogeneous texture without focal hepatic parenchymal lesions or intrahepatic ductal dilatation. Trace fluid identified adjacent to the liver. Hepatopetal portal vein. Gallbladder is distended with evidence of sludge and focal wall thickening. No shadowing stones or pericholecystic fluid identified. To evaluate for cholecystitis consider HIDA scan. CBD measured 0.4cm. IMPRESSION: 1. Trace perihepatic fluid. 2. Distended gallbladder with wall thickening and echogenic sludge. 3. Findings suggest cholecystitis. Consider HIDA scan, if indicated, to further evaluate for cholecystitis. Electronically Signed   By: [name] M.D.   On: 08/20/2022 20:36   CT Angio Chest/Abd/Pel for Dissection W and/or  W/WO  Result Date: 08/20/2022 CLINICAL DATA:  Acute aortic syndrome suspected. EXAM: CT ANGIOGRAPHY CHEST, ABDOMEN AND PELVIS TECHNIQUE: Non-contrast CT of the chest was initially obtained. Multidetector CT imaging through the chest, abdomen and pelvis was performed using the standard protocol during bolus administration of intravenous contrast. Multiplanar reconstructed images and MIPs were obtained and reviewed to evaluate the vascular anatomy. RADIATION DOSE REDUCTION: This exam was performed according to the departmental dose-optimization program which includes automated exposure control, adjustment of the mA and/or kV according to patient size and/or use of iterative reconstruction technique. CONTRAST:  181mL OMNIPAQUE IOHEXOL 350 MG/ML SOLN COMPARISON:  CT abdomen pelvis dated 02/19/2007. FINDINGS: CTA CHEST FINDINGS Cardiovascular: There is no cardiomegaly. Trace pericardial effusion, likely related to recent CABG. There is 3 vessel coronary vascular calcification. The thoracic aorta is unremarkable. The origins of the great vessels of the aortic arch and the central pulmonary arteries appear patent. Mediastinum/Nodes: No hilar or mediastinal adenopathy. The esophagus is grossly unremarkable. No mediastinal fluid collection. Lungs/Pleura: Minimal bibasilar dependent atelectasis. No focal consolidation, pleural effusion, or pneumothorax. The central airways are patent. Musculoskeletal: Median sternotomy wires. No acute osseous pathology. Review of the MIP images confirms the above findings. CTA ABDOMEN AND PELVIS FINDINGS VASCULAR Aorta: Mild aortoiliac atherosclerotic disease. No aneurysmal dilatation or dissection. No periaortic fluid collection. Celiac: Patent without evidence of aneurysm, dissection, vasculitis or significant stenosis. SMA: Patent without evidence of aneurysm, dissection, vasculitis or significant stenosis. Renals: The renal arteries are patent. Duplicated left renal artery narrowing.  IMA: The IMA is patent. Inflow: Mild atherosclerotic calcification. No aneurysmal dilatation. The iliac arteries are patent. Veins: No obvious venous abnormality within the limitations of this arterial phase study. Review of the MIP images confirms the above findings. NON-VASCULAR No intra-abdominal free air or free fluid. Hepatobiliary: The liver is unremarkable. No biliary ductal dilatation. No calcified gallstone or pericholecystic fluid. Minimal haziness adjacent to the gallbladder neck. Ultrasound may provide better evaluation if there is clinical concern for acute cholecystitis. Pancreas: Unremarkable. No pancreatic ductal dilatation or surrounding inflammatory changes. Spleen: Normal in size without focal abnormality. Adrenals/Urinary Tract: The adrenal glands unremarkable. The kidneys, visualized ureters, and the urinary bladder appear unremarkable. Stomach/Bowel: There is moderate stool throughout the colon. There is no bowel obstruction or active inflammation. Mild thickened appearance of the distal stomach likely related to underdistention. The appendix is normal. Lymphatic: No adenopathy. Reproductive: The prostate and seminal vesicles are grossly unremarkable. Other: None Musculoskeletal: Degenerative changes.  No acute osseous pathology. Review of the MIP images confirms the above findings. IMPRESSION: 1. No acute intrathoracic, abdominal, or pelvic pathology. No aortic aneurysm or dissection. 2. Moderate colonic stool burden. No bowel obstruction. Normal appendix. 3. Minimal haziness adjacent to the gallbladder neck. Ultrasound  may provide better evaluation if there is clinical concern for acute cholecystitis. Electronically Signed   By: Anner Crete M.D.   On: 08/20/2022 19:51    ROS - all of the below systems have been reviewed with the patient and positives are indicated with bold text General: chills, fever or night sweats Eyes: blurry vision or double vision ENT: epistaxis or sore  throat Hematologic/Lymphatic: bleeding problems, blood clots or swollen lymph nodes Endocrine: temperature intolerance or unexpected weight changes Breast: new or changing breast lumps or nipple discharge Resp: cough, shortness of breath, or wheezing CV: chest pain or dyspnea on exertion GI: as per HPI GU: dysuria, trouble voiding, or hematuria Neuro: TIA or stroke symptoms    PE Blood pressure 131/79, pulse 85, temperature 97.8 F (36.6 C), temperature source Oral, resp. rate 18, height 6\' 2"  (1.88 m), weight 95.3 kg, SpO2 93 %. Constitutional: NAD; conversant; no deformities Eyes: Moist conjunctiva; no lid lag; anicteric; PERRL Neck: Trachea midline; no thyromegaly Lungs: Normal respiratory effort CV: RRR GI: Abd soft, tender to palpation in right upper quadrant MSK: Normal range of motion of extremities; no clubbing/cyanosis Psychiatric: Appropriate affect; alert and oriented x3  Results for orders placed or performed during the hospital encounter of 08/20/22 (from the past 48 hour(s))  Lipase, blood     Status: None   Collection Time: 08/20/22  4:50 PM  Result Value Ref Range   Lipase 26 11 - 51 U/L    Comment: Performed at [facility], [address] [place], [place] [address]  Comprehensive metabolic panel     Status: Abnormal   Collection Time: 08/20/22  4:50 PM  Result Value Ref Range   Sodium 135 135 - 145 mmol/L   Potassium 3.8 3.5 - 5.1 mmol/L   Chloride 100 98 - 111 mmol/L   CO2 26 22 - 32 mmol/L   Glucose, Bld 135 (H) 70 - 99 mg/dL    Comment: Glucose reference range applies only to samples taken after fasting for at least 8 hours.   BUN 17 8 - 23 mg/dL   Creatinine, Ser 1.21 0.61 - 1.24 mg/dL   Calcium 9.0 8.9 - 10.3 mg/dL   Total Protein 7.8 6.5 - 8.1 g/dL   Albumin 4.2 3.5 - 5.0 g/dL   AST 20 15 - 41 U/L   ALT 26 0 - 44 U/L   Alkaline Phosphatase 115 38 - 126 U/L   Total Bilirubin 1.2 0.3 - 1.2 mg/dL   GFR, Estimated >60 >60 mL/min     Comment: (NOTE) Calculated using the CKD-EPI Creatinine Equation (2021)    Anion gap 9 5 - 15    Comment: Performed at [facility], [address] [place], [place] [address]  CBC     Status: Abnormal   Collection Time: 08/20/22  4:50 PM  Result Value Ref Range   WBC 12.4 (H) 4.0 - 10.5 K/uL   RBC 4.67 4.22 - 5.81 MIL/uL   Hemoglobin 14.5 13.0 - 17.0 g/dL   HCT 42.6 39.0 - 52.0 %   MCV 91.2 80.0 - 100.0 fL   MCH 31.0 26.0 - 34.0 pg   MCHC 34.0 30.0 - 36.0 g/dL   RDW 13.3 11.5 - 15.5 %   Platelets 210 150 - 400 K/uL   nRBC 0.0 0.0 - 0.2 %    Comment: Performed at [facility], [address] [place], [place] [address]  Urinalysis, Routine w reflex  microscopic -Urine, Clean Catch     Status: Abnormal   Collection Time: 08/20/22  4:50 PM  Result Value Ref Range   Color, Urine YELLOW YELLOW   APPearance CLEAR CLEAR   Specific Gravity, Urine 1.020 1.005 - 1.030   pH 5.5 5.0 - 8.0   Glucose, UA >=500 (A) NEGATIVE mg/dL   Hgb urine dipstick NEGATIVE NEGATIVE   Bilirubin Urine NEGATIVE NEGATIVE   Ketones, ur NEGATIVE NEGATIVE mg/dL   Protein, ur NEGATIVE NEGATIVE mg/dL   Nitrite NEGATIVE NEGATIVE   Leukocytes,Ua NEGATIVE NEGATIVE    Comment: Performed at [facility], [address] [place], [place] [address]  Troponin I (High Sensitivity)     Status: None   Collection Time: 08/20/22  4:50 PM  Result Value Ref Range   Troponin I (High Sensitivity) 4 <18 ng/L    Comment: (NOTE) Elevated high sensitivity troponin I (hsTnI) values and significant  changes across serial measurements may suggest ACS but many other  chronic and acute conditions are known to elevate hsTnI results.  Refer to the "Links" section for chest pain algorithms and additional  guidance. Performed at [facility], [address] [place], [place] [address]   Urinalysis, Microscopic (reflex)     Status: Abnormal   Collection Time: 08/20/22  4:50 PM  Result Value  Ref Range   RBC / HPF 0-5 0 - 5 RBC/hpf   WBC, UA 0-5 0 - 5 WBC/hpf   Bacteria, UA RARE (A) NONE SEEN   Squamous Epithelial / HPF 0-5 0 - 5 /HPF    Comment: Performed at [facility], [address] [place], [place] [address]  Brain natriuretic peptide     Status: None   Collection Time: 08/20/22  4:50 PM  Result Value Ref Range   B Natriuretic Peptide 94.7 0.0 - 100.0 pg/mL    Comment: Performed at [facility], [address] [place], [place] [address]  Glucose, capillary     Status: Abnormal   Collection Time: 08/21/22  2:11 AM  Result Value Ref Range   Glucose-Capillary 229 (H) 70 - 99 mg/dL    Comment: Glucose reference range applies only to samples taken after fasting for at least 8 hours.  Comprehensive metabolic panel     Status: Abnormal   Collection Time: 08/21/22  4:53 AM  Result Value Ref Range   Sodium 140 135 - 145 mmol/L   Potassium 4.2 3.5 - 5.1 mmol/L   Chloride 102 98 - 111 mmol/L   CO2 25 22 - 32 mmol/L   Glucose, Bld 236 (H) 70 - 99 mg/dL    Comment: Glucose reference range applies only to samples taken after fasting for at least 8 hours.   BUN 17 8 - 23 mg/dL   Creatinine, Ser 1.11 0.61 - 1.24 mg/dL   Calcium 9.0 8.9 - 10.3 mg/dL   Total Protein 7.5 6.5 - 8.1 g/dL   Albumin 4.0 3.5 - 5.0 g/dL   AST 23 15 - 41 U/L   ALT 25 0 - 44 U/L   Alkaline Phosphatase 100 38 - 126 U/L   Total Bilirubin 1.4 (H) 0.3 - 1.2 mg/dL   GFR, Estimated >60 >60 mL/min    Comment: (NOTE) Calculated using the CKD-EPI Creatinine Equation (2021)    Anion gap 13 5 - 15    Comment: Performed at [facility], [place] [address] [place],  [address]  CBC  with Differential/Platelet     Status: Abnormal   Collection Time: 08/21/22  4:53 AM  Result Value Ref Range   WBC 19.0 (H) 4.0 - 10.5 K/uL   RBC 4.84 4.22 - 5.81 MIL/uL   Hemoglobin 14.4 13.0 - 17.0 g/dL   HCT 44.7 39.0 - 52.0 %   MCV 92.4 80.0 - 100.0 fL   MCH 29.8 26.0 - 34.0 pg    MCHC 32.2 30.0 - 36.0 g/dL   RDW 13.5 11.5 - 15.5 %   Platelets 189 150 - 400 K/uL   nRBC 0.0 0.0 - 0.2 %   Neutrophils Relative % 79 %   Neutro Abs 15.0 (H) 1.7 - 7.7 K/uL   Lymphocytes Relative 10 %   Lymphs Abs 1.8 0.7 - 4.0 K/uL   Monocytes Relative 10 %   Monocytes Absolute 2.0 (H) 0.1 - 1.0 K/uL   Eosinophils Relative 0 %   Eosinophils Absolute 0.0 0.0 - 0.5 K/uL   Basophils Relative 0 %   Basophils Absolute 0.1 0.0 - 0.1 K/uL   Immature Granulocytes 1 %   Abs Immature Granulocytes 0.14 (H) 0.00 - 0.07 K/uL    Comment: Performed at [facility], [place] [address], [place], [place] [address]  Protime-INR     Status: None   Collection Time: 08/21/22  4:53 AM  Result Value Ref Range   Prothrombin Time 14.9 11.4 - 15.2 seconds   INR 1.2 0.8 - 1.2    Comment: (NOTE) INR goal varies based on device and disease states. Performed at [facility], [place] [address], [place], [place] [address]   Glucose, capillary     Status: Abnormal   Collection Time: 08/21/22  7:12 AM  Result Value Ref Range   Glucose-Capillary 201 (H) 70 - 99 mg/dL    Comment: Glucose reference range applies only to samples taken after fasting for at least 8 hours.    US Abdomen Limited RUQ (LIVER/GB)  Result Date: 08/20/2022 CLINICAL DATA:  [id number] Epigastric pain [id number] EXAM: ULTRASOUND ABDOMEN LIMITED COMPARISON:  02/09/2007. FINDINGS: The liver demonstrates normal parenchymal echogenicity and homogeneous texture without focal hepatic parenchymal lesions or intrahepatic ductal dilatation. Trace fluid identified adjacent to the liver. Hepatopetal portal vein. Gallbladder is distended with evidence of sludge and focal wall thickening. No shadowing stones or pericholecystic fluid identified. To evaluate for cholecystitis consider HIDA scan. CBD measured 0.4cm. IMPRESSION: 1. Trace perihepatic fluid. 2. Distended gallbladder with wall thickening and echogenic sludge. 3. Findings  suggest cholecystitis. Consider HIDA scan, if indicated, to further evaluate for cholecystitis. Electronically Signed   By: [name] M.D.   On: 08/20/2022 20:36   CT Angio Chest/Abd/Pel for Dissection W and/or W/WO  Result Date: 08/20/2022 CLINICAL DATA:  Acute aortic syndrome suspected. EXAM: CT ANGIOGRAPHY CHEST, ABDOMEN AND PELVIS TECHNIQUE: Non-contrast CT of the chest was initially obtained. Multidetector CT imaging through the chest, abdomen and pelvis was performed using the standard protocol during bolus administration of intravenous contrast. Multiplanar reconstructed images and MIPs were obtained and reviewed to evaluate the vascular anatomy. RADIATION DOSE REDUCTION: This exam was performed according to the departmental dose-optimization program which includes automated exposure control, adjustment of the mA and/or kV according to patient size and/or use of iterative reconstruction technique. CONTRAST:  160mL OMNIPAQUE IOHEXOL 350 MG/ML SOLN COMPARISON:  CT abdomen pelvis dated 02/19/2007. FINDINGS: CTA CHEST FINDINGS Cardiovascular: There is no cardiomegaly. Trace pericardial effusion, likely related to recent CABG. There is 3 vessel coronary vascular  calcification. The thoracic aorta is unremarkable. The origins of the great vessels of the aortic arch and the central pulmonary arteries appear patent. Mediastinum/Nodes: No hilar or mediastinal adenopathy. The esophagus is grossly unremarkable. No mediastinal fluid collection. Lungs/Pleura: Minimal bibasilar dependent atelectasis. No focal consolidation, pleural effusion, or pneumothorax. The central airways are patent. Musculoskeletal: Median sternotomy wires. No acute osseous pathology. Review of the MIP images confirms the above findings. CTA ABDOMEN AND PELVIS FINDINGS VASCULAR Aorta: Mild aortoiliac atherosclerotic disease. No aneurysmal dilatation or dissection. No periaortic fluid collection. Celiac: Patent without evidence of aneurysm,  dissection, vasculitis or significant stenosis. SMA: Patent without evidence of aneurysm, dissection, vasculitis or significant stenosis. Renals: The renal arteries are patent. Duplicated left renal artery narrowing. IMA: The IMA is patent. Inflow: Mild atherosclerotic calcification. No aneurysmal dilatation. The iliac arteries are patent. Veins: No obvious venous abnormality within the limitations of this arterial phase study. Review of the MIP images confirms the above findings. NON-VASCULAR No intra-abdominal free air or free fluid. Hepatobiliary: The liver is unremarkable. No biliary ductal dilatation. No calcified gallstone or pericholecystic fluid. Minimal haziness adjacent to the gallbladder neck. Ultrasound may provide better evaluation if there is clinical concern for acute cholecystitis. Pancreas: Unremarkable. No pancreatic ductal dilatation or surrounding inflammatory changes. Spleen: Normal in size without focal abnormality. Adrenals/Urinary Tract: The adrenal glands unremarkable. The kidneys, visualized ureters, and the urinary bladder appear unremarkable. Stomach/Bowel: There is moderate stool throughout the colon. There is no bowel obstruction or active inflammation. Mild thickened appearance of the distal stomach likely related to underdistention. The appendix is normal. Lymphatic: No adenopathy. Reproductive: The prostate and seminal vesicles are grossly unremarkable. Other: None Musculoskeletal: Degenerative changes.  No acute osseous pathology. Review of the MIP images confirms the above findings. IMPRESSION: 1. No acute intrathoracic, abdominal, or pelvic pathology. No aortic aneurysm or dissection. 2. Moderate colonic stool burden. No bowel obstruction. Normal appendix. 3. Minimal haziness adjacent to the gallbladder neck. Ultrasound may provide better evaluation if there is clinical concern for acute cholecystitis. Electronically Signed   By: Anner Crete M.D.   On: 08/20/2022 19:51      A/P: Luis Castillo is an 62 y.o. male with right upper quadrant pain consistent with acute cholecystitis  I have recommended laparoscopic cholecystectomy.  We have discussed this in detail.  The anatomy & physiology of hepatobiliary & pancreatic function was discussed.  The pathophysiology of gallbladder dysfunction was discussed.  Natural history risks without surgery was discussed.   I feel the risks of no intervention will lead to serious problems that outweigh the operative risks; therefore, I recommended cholecystectomy to remove the pathology.  I explained laparoscopic techniques with possible need for an open approach.  Probable cholangiogram to evaluate the bilary tract was explained as well.    Risks such as bleeding, infection, abscess, leak, injury to other organs, need for further treatment, heart attack, death, and other risks were discussed.  I noted a good likelihood this will help address the problem.  Possibility that this will not correct all abdominal symptoms was explained.  Goals of post-operative recovery were discussed as well.  We will work to minimize complications.  An educational handout further explaining the pathology and treatment options was given as well.  Questions were answered.  The patient expresses understanding & wishes to proceed with surgery.  Rosario Adie, MD  Colorectal and General Surgery [facility] Surgery  Total time of evaluation, examination, counseling and implementing medical decisions was 57 mins.  moderate decision making.

## 2022-08-21 NOTE — Transfer of Care (Signed)
Immediate Anesthesia Transfer of Care Note  Patient: Luis Castillo  Procedure(s) Performed: LAPAROSCOPIC CHOLECYSTECTOMY  Patient Location: PACU  Anesthesia Type:General  Level of Consciousness: drowsy  Airway & Oxygen Therapy: Patient Spontanous Breathing and Patient connected to face mask oxygen  Post-op Assessment: Report given to RN, Post -op Vital signs reviewed and stable, and Patient moving all extremities X 4  Post vital signs: Reviewed and stable  Last Vitals:  Vitals Value Taken Time  BP 117/81 08/21/22 1516  Temp    Pulse 55 08/21/22 1518  Resp 5 08/21/22 1518  SpO2 96 % 08/21/22 1518  Vitals shown include unvalidated device data.  Last Pain:  Vitals:   08/21/22 1220  TempSrc:   PainSc: 6       Patients Stated Pain Goal: 2 ([id number])  Complications: No notable events documented.

## 2022-08-21 NOTE — Anesthesia Preprocedure Evaluation (Addendum)
Anesthesia Evaluation  Patient identified by MRN, date of birth, ID band Patient awake    Reviewed: Allergy & Precautions, H&P , NPO status , Patient's Chart, lab work & pertinent test results, reviewed documented beta blocker date and time   Airway Mallampati: II  TM Distance: >3 FB Neck ROM: Full    Dental no notable dental hx. (+) Dental Advisory Given, Teeth Intact   Pulmonary neg pulmonary ROS   Pulmonary exam normal breath sounds clear to auscultation       Cardiovascular hypertension, Pt. on medications and Pt. on home beta blockers + CAD and + CABG  Normal cardiovascular exam Rhythm:Regular Rate:Normal  Echo (intra-op) 07/2022 POST-OP IMPRESSIONS  Overall, there were no significant changes from pre-bypass.   PRE-OP FINDINGS   Left Ventricle: The left ventricle has normal systolic function, with an  ejection fraction of 55-60%. The cavity size was normal. There is mildly  increased left ventricular wall thickness. There is mild concentric left  ventricular hypertrophy.  Right Ventricle: The right ventricle has normal systolic function. The  cavity was normal. There is no increase in right ventricular wall  thickness.  Left Atrium: Left atrial size was normal in size. No left atrial/left atrial appendage thrombus was detected.  Right Atrium: Right atrial size was normal in size.  Interatrial Septum: No atrial level shunt detected by color flow Doppler.  Pericardium: There is no evidence of pericardial effusion.  Mitral Valve: The mitral valve is normal in structure. Mitral valve regurgitation is trivial by color flow Doppler. There is No evidence of mitral stenosis.  Tricuspid Valve: The tricuspid valve was normal in structure. Tricuspid valve regurgitation was not visualized by color flow Doppler.  Aortic Valve: The aortic valve is normal in structure. Aortic valve regurgitation was not visualized by color flow Doppler.  There is no stenosis of the aortic valve.  Pulmonic Valve: The pulmonic valve was normal in structure. Pulmonic valve regurgitation is mild by color flow Doppler.  Aorta: The aortic root, ascending aorta and aortic arch are normal in size and structure.     Echo 06/2022 1. Left ventricular ejection fraction, by estimation, is 55 to 60%. The left ventricle has normal function. The left ventricle has no regional wall motion abnormalities. There is mild concentric left ventricular hypertrophy. Left ventricular diastolic parameters are consistent with Grade I diastolic dysfunction (impaired relaxation).   2. Right ventricular systolic function is normal. The right ventricular size is normal. Tricuspid regurgitation signal is inadequate for assessing PA pressure.   3. The mitral valve is normal in structure. Trivial mitral valve regurgitation. No evidence of mitral stenosis.   4. The aortic valve is tricuspid. Aortic valve regurgitation is not visualized. No aortic stenosis is present.   5. Aortic dilatation noted. There is mild dilatation of the aortic root, measuring 41 mm. There is mild dilatation of the ascending aorta, measuring 41 mm.   6. The inferior vena cava is normal in size with greater than 50% respiratory variability, suggesting right atrial pressure of 3 mmHg.      Neuro/Psych  Headaches    GI/Hepatic   Endo/Other  diabetes, Type 2    Renal/GU      Musculoskeletal  (+) Arthritis ,    Abdominal   Peds  Hematology   Anesthesia Other Findings   Reproductive/Obstetrics  Anesthesia Physical Anesthesia Plan  ASA: 3  Anesthesia Plan: General   Post-op Pain Management: Ofirmev IV (intra-op)*   Induction: Intravenous, Rapid sequence and Cricoid pressure planned  PONV Risk Score and Plan: 4 or greater and Ondansetron, Dexamethasone, Midazolam and Treatment may vary due to age or medical condition  Airway Management  Planned: Oral ETT  Additional Equipment:   Intra-op Plan:   Post-operative Plan: Extubation in OR  Informed Consent: I have reviewed the patients History and Physical, chart, labs and discussed the procedure including the risks, benefits and alternatives for the proposed anesthesia with the patient or authorized representative who has indicated his/her understanding and acceptance.     Dental advisory given  Plan Discussed with: CRNA  Anesthesia Plan Comments:         Anesthesia Quick Evaluation

## 2022-08-21 NOTE — Anesthesia Procedure Notes (Signed)
Procedure Name: Intubation Date/Time: 08/21/2022 1:23 PM  Performed by: Niel Hummer, CRNAPre-anesthesia Checklist: Patient identified, Emergency Drugs available, Suction available and Patient being monitored Patient Re-evaluated:Patient Re-evaluated prior to induction Oxygen Delivery Method: Circle system utilized Preoxygenation: Pre-oxygenation with 100% oxygen Induction Type: IV induction, Rapid sequence and Cricoid Pressure applied Laryngoscope Size: Mac and 4 Grade View: Grade I Tube type: Oral Tube size: 7.5 mm Number of attempts: 1 Airway Equipment and Method: Stylet Placement Confirmation: ETT inserted through vocal cords under direct vision, positive ETCO2 and breath sounds checked- equal and bilateral Secured at: 24 cm Tube secured with: Tape Dental Injury: Teeth and Oropharynx as per pre-operative assessment

## 2022-08-22 ENCOUNTER — Encounter (HOSPITAL_COMMUNITY): Payer: Self-pay | Admitting: General Surgery

## 2022-08-22 DIAGNOSIS — I9789 Other postprocedural complications and disorders of the circulatory system, not elsewhere classified: Secondary | ICD-10-CM | POA: Diagnosis not present

## 2022-08-22 DIAGNOSIS — R9431 Abnormal electrocardiogram [ECG] [EKG]: Secondary | ICD-10-CM | POA: Diagnosis not present

## 2022-08-22 DIAGNOSIS — Z951 Presence of aortocoronary bypass graft: Secondary | ICD-10-CM | POA: Diagnosis not present

## 2022-08-22 DIAGNOSIS — I4891 Unspecified atrial fibrillation: Secondary | ICD-10-CM

## 2022-08-22 DIAGNOSIS — K810 Acute cholecystitis: Secondary | ICD-10-CM | POA: Diagnosis not present

## 2022-08-22 LAB — COMPREHENSIVE METABOLIC PANEL
ALT: 30 U/L (ref 0–44)
AST: 23 U/L (ref 15–41)
Albumin: 3.2 g/dL — ABNORMAL LOW (ref 3.5–5.0)
Alkaline Phosphatase: 74 U/L (ref 38–126)
Anion gap: 6 (ref 5–15)
BUN: 18 mg/dL (ref 8–23)
CO2: 26 mmol/L (ref 22–32)
Calcium: 8.7 mg/dL — ABNORMAL LOW (ref 8.9–10.3)
Chloride: 107 mmol/L (ref 98–111)
Creatinine, Ser: 0.94 mg/dL (ref 0.61–1.24)
GFR, Estimated: >60 mL/min (ref >60)
Glucose, Bld: 209 mg/dL — ABNORMAL HIGH (ref 70–99)
Potassium: 4.3 mmol/L (ref 3.5–5.1)
Sodium: 139 mmol/L (ref 135–145)
Total Bilirubin: 0.8 mg/dL (ref 0.3–1.2)
Total Protein: 6.4 g/dL — ABNORMAL LOW (ref 6.5–8.1)

## 2022-08-22 LAB — CBC
HCT: 39.4 % (ref 39.0–52.0)
Hemoglobin: 12.4 g/dL — ABNORMAL LOW (ref 13.0–17.0)
MCH: 30 pg (ref 26.0–34.0)
MCHC: 31.5 g/dL (ref 30.0–36.0)
MCV: 95.4 fL (ref 80.0–100.0)
Platelets: 149 10*3/uL — ABNORMAL LOW (ref 150–400)
RBC: 4.13 MIL/uL — ABNORMAL LOW (ref 4.22–5.81)
RDW: 13.9 % (ref 11.5–15.5)
WBC: 16.8 10*3/uL — ABNORMAL HIGH (ref 4.0–10.5)
nRBC: 0 % (ref 0.0–0.2)

## 2022-08-22 LAB — GLUCOSE, CAPILLARY
Glucose-Capillary: 198 mg/dL — ABNORMAL HIGH (ref 70–99)
Glucose-Capillary: 163 mg/dL — ABNORMAL HIGH (ref 70–99)
Glucose-Capillary: 275 mg/dL — ABNORMAL HIGH (ref 70–99)
Glucose-Capillary: 243 mg/dL — ABNORMAL HIGH (ref 70–99)
Glucose-Capillary: 195 mg/dL — ABNORMAL HIGH (ref 70–99)

## 2022-08-22 LAB — SURGICAL PATHOLOGY

## 2022-08-22 MED ORDER — ENOXAPARIN SODIUM 40 MG/0.4ML IJ SOSY: 40.0000 mg | PREFILLED_SYRINGE | INTRAMUSCULAR | Status: DC

## 2022-08-22 MED ORDER — ENOXAPARIN SODIUM 40 MG/0.4ML IJ SOSY
40.0000 mg | PREFILLED_SYRINGE | INTRAMUSCULAR | Status: DC
  Administered 2022-08-22 – 2022-08-23 (×2): 40 mg via SUBCUTANEOUS
  Filled 2022-08-22 (×2): qty 0.4

## 2022-08-22 MED ORDER — INSULIN ASPART 100 UNIT/ML IJ SOLN
0.0000 [IU] | Freq: Three times a day (TID) | INTRAMUSCULAR | Status: DC
  Administered 2022-08-22: 5 [IU] via SUBCUTANEOUS
  Administered 2022-08-22: 3 [IU] via SUBCUTANEOUS
  Administered 2022-08-23: 1 [IU] via SUBCUTANEOUS

## 2022-08-22 MED ORDER — INSULIN ASPART 100 UNIT/ML IJ SOLN: 0.0000 [IU] | Freq: Every day | INTRAMUSCULAR | Status: DC

## 2022-08-22 NOTE — Progress Notes (Signed)
PROGRESS NOTE    Luis Castillo  [id number] DOB: 1961-02-07 DOA: 08/20/2022 PCP: Wenda Low, MD  Chief Complaint  Patient presents with   Abdominal Pain    Brief Narrative:   Luis Castillo is Luis Castillo 62 y.o. male with medical history significant of CAD with recent CABG procedure February 2024, diabetes mellitus on oral agents, history of postoperative atrial fibrillation on amiodarone, hypertension, dyslipidemia, HFpEF and mild osteoarthritis who presented with acute cholecystitis, now s/p lap chole.   Assessment & Plan:   Principal Problem:   Acute cholecystitis Active Problems:   S/P CABG (coronary artery bypass graft)   Secondary hypertension   CAD s/p 4 vessel CABG 07/23/2022   Diabetes mellitus type 2, noninsulin dependent (HCC)   Postoperative atrial fibrillation (HCC)   Preop cardiovascular exam   Prolonged QT interval  Acute cholecystitis S/p lap chole 3/21, gangrenous, perforated cholecystitis Continue abx for now JP drain x2 weeks Possible d/c tmrw, per surgery Diet per surgery Seen by cardiology preoperatively for clearance   Acute hypoxemia Resolved, due to splinting/abdominal pain   Recent CABG procedure Has done well from Luis Castillo Needs postoperative standpoint Statin, aspirin (ok to reduce to 81 mg daily) Continue beta-blocker   Diabetes mellitus on oral agents w/ hyperglycemia Holding home Jardiance, Amaryl and metformin for now (patient also received IV contrast so will need to hold metformin Luis Castillo total of 48 hours from date of CT angio on 2/20) Hemoglobin A1c was 7.6 on 07/22/2022 SSI    Postoperative atrial fibrillation Continue metoprolol Amiodarone d/c'd due to qt prolongation Telemetry monitoring   Hypertension Lotensin, metoprolol and amlodipine     DVT prophylaxis: SCD, will discuss timing of lovenox with surgery -> ok to resume today Code Status: full Family Communication: none Disposition:   Status is: Inpatient Remains inpatient  appropriate because: awaiting surgery clearance for d/c post op   Consultants:  Surgery cardiology  Procedures:  Lap chole  Antimicrobials:  Anti-infectives (From admission, onward)    Start     Dose/Rate Route Frequency Ordered Stop   08/21/22 0800  piperacillin-tazobactam (ZOSYN) IVPB 3.375 g        3.375 g 12.5 mL/hr over 240 Minutes Intravenous Every 8 hours 08/21/22 0703     08/20/22 2200  cefTRIAXone (ROCEPHIN) 2 g in sodium chloride 0.9 % 100 mL IVPB  Status:  Discontinued       Note to Pharmacy: Pharmacy may adjust dosing strength / duration / interval for maximal efficacy   2 g 200 mL/hr over 30 Minutes Intravenous Every 24 hours 08/20/22 2101 08/21/22 0635       Subjective: C/o pain, but feels much better  Objective: Vitals:   08/21/22 1919 08/21/22 2047 08/21/22 2152 08/22/22 0556  BP: (!) 142/80 (!) 143/82 129/81 130/82  Pulse: 66 68 66 63  Resp: 16 18 18 16   Temp: 97.9 F (36.6 C) 98.6 F (37 C) 98.4 F (36.9 C) 98.5 F (36.9 C)  TempSrc:  Oral Oral Oral  SpO2: 97% 98% 98% 92%  Weight:      Height:        Intake/Output Summary (Last 24 hours) at 08/22/2022 0851 Last data filed at 08/22/2022 0558 Gross per 24 hour  Intake 3468.54 ml  Output 3145 ml  Net 323.54 ml   Filed Weights   08/20/22 1648 08/21/22 0105  Weight: 91.6 kg 95.3 kg    Examination:  General exam: Appears calm and comfortable  Respiratory system: unlabored Cardiovascular system: RRR Gastrointestinal system: soft,  nondistended, nontender - laparoscopic incisions, jp drain with bloody output Central nervous system: Alert and oriented. No focal neurological deficits. Extremities: no LEE   Data Reviewed: I have personally reviewed following labs and imaging studies  CBC: Recent Labs  Lab 08/20/22 1650 08/21/22 0453 08/22/22 0507  WBC 12.4* 19.0* 16.8*  NEUTROABS  --  15.0*  --   HGB 14.5 14.4 12.4*  HCT 42.6 44.7 39.4  MCV 91.2 92.4 95.4  PLT 210 189 149*    Basic  Metabolic Panel: Recent Labs  Lab 08/20/22 1650 08/21/22 0453 08/22/22 0507  NA 135 140 139  K 3.8 4.2 4.3  CL 100 102 107  CO2 26 25 26   GLUCOSE 135* 236* 209*  BUN 17 17 18   CREATININE 1.21 1.11 0.94  CALCIUM 9.0 9.0 8.7*    GFR: Estimated Creatinine Clearance: 94.7 mL/min (by C-G formula based on SCr of 0.94 mg/dL).  Liver Function Tests: Recent Labs  Lab 08/20/22 1650 08/21/22 0453 08/22/22 0507  AST 20 23 23   ALT 26 25 30   ALKPHOS 115 100 74  BILITOT 1.2 1.4* 0.8  PROT 7.8 7.5 6.4*  ALBUMIN 4.2 4.0 3.2*    CBG: Recent Labs  Lab 08/21/22 1517 08/21/22 1637 08/21/22 1922 08/22/22 0505 08/22/22 0714  GLUCAP 233* 236* 220* 198* 163*     Recent Results (from the past 240 hour(s))  Surgical pcr screen     Status: Abnormal   Collection Time: 08/21/22 11:34 AM   Specimen: Nasal Mucosa; Nasal Swab  Result Value Ref Range Status   MRSA, PCR NEGATIVE NEGATIVE Final   Staphylococcus aureus POSITIVE (Luis Castillo) NEGATIVE Final    Comment: (NOTE) The Xpert SA Assay (FDA approved for NASAL specimens in patients 30 years of age and older), is one component of Luis Castillo comprehensive surveillance program. It is not intended to diagnose infection nor to guide or monitor treatment. Performed at [facility], [place] [address] [place], [place] [address]          Radiology Studies: US Abdomen Limited RUQ (LIVER/GB)  Result Date: 08/20/2022 CLINICAL DATA:  [id number] Epigastric pain [id number] EXAM: ULTRASOUND ABDOMEN LIMITED COMPARISON:  02/09/2007. FINDINGS: The liver demonstrates normal parenchymal echogenicity and homogeneous texture without focal hepatic parenchymal lesions or intrahepatic ductal dilatation. Trace fluid identified adjacent to the liver. Hepatopetal portal vein. Gallbladder is distended with evidence of sludge and focal wall thickening. No shadowing stones or pericholecystic fluid identified. To evaluate for cholecystitis consider HIDA scan. CBD  measured 0.4cm. IMPRESSION: 1. Trace perihepatic fluid. 2. Distended gallbladder with wall thickening and echogenic sludge. 3. Findings suggest cholecystitis. Consider HIDA scan, if indicated, to further evaluate for cholecystitis. Electronically Signed   By: Luis Castillo M.D.   On: 08/20/2022 20:36   CT Angio Chest/Abd/Pel for Dissection W and/or W/WO  Result Date: 08/20/2022 CLINICAL DATA:  Acute aortic syndrome suspected. EXAM: CT ANGIOGRAPHY CHEST, ABDOMEN AND PELVIS TECHNIQUE: Non-contrast CT of the chest was initially obtained. Multidetector CT imaging through the chest, abdomen and pelvis was performed using the standard protocol during bolus administration of intravenous contrast. Multiplanar reconstructed images and MIPs were obtained and reviewed to evaluate the vascular anatomy. RADIATION DOSE REDUCTION: This exam was performed according to the departmental dose-optimization program which includes automated exposure control, adjustment of the mA and/or kV according to patient size and/or use of iterative reconstruction technique. CONTRAST:  148mL OMNIPAQUE IOHEXOL 350 MG/ML SOLN COMPARISON:  CT abdomen pelvis dated 02/19/2007. FINDINGS: CTA CHEST FINDINGS Cardiovascular: There is  no cardiomegaly. Trace pericardial effusion, likely related to recent CABG. There is 3 vessel coronary vascular calcification. The thoracic aorta is unremarkable. The origins of the great vessels of the aortic arch and the central pulmonary arteries appear patent. Mediastinum/Nodes: No hilar or mediastinal adenopathy. The esophagus is grossly unremarkable. No mediastinal fluid collection. Lungs/Pleura: Minimal bibasilar dependent atelectasis. No focal consolidation, pleural effusion, or pneumothorax. The central airways are patent. Musculoskeletal: Median sternotomy wires. No acute osseous pathology. Review of the MIP images confirms the above findings. CTA ABDOMEN AND PELVIS FINDINGS VASCULAR Aorta: Mild aortoiliac  atherosclerotic disease. No aneurysmal dilatation or dissection. No periaortic fluid collection. Celiac: Patent without evidence of aneurysm, dissection, vasculitis or significant stenosis. SMA: Patent without evidence of aneurysm, dissection, vasculitis or significant stenosis. Renals: The renal arteries are patent. Duplicated left renal artery narrowing. IMA: The IMA is patent. Inflow: Mild atherosclerotic calcification. No aneurysmal dilatation. The iliac arteries are patent. Veins: No obvious venous abnormality within the limitations of this arterial phase study. Review of the MIP images confirms the above findings. NON-VASCULAR No intra-abdominal free air or free fluid. Hepatobiliary: The liver is unremarkable. No biliary ductal dilatation. No calcified gallstone or pericholecystic fluid. Minimal haziness adjacent to the gallbladder neck. Ultrasound may provide better evaluation if there is clinical concern for acute cholecystitis. Pancreas: Unremarkable. No pancreatic ductal dilatation or surrounding inflammatory changes. Spleen: Normal in size without focal abnormality. Adrenals/Urinary Tract: The adrenal glands unremarkable. The kidneys, visualized ureters, and the urinary bladder appear unremarkable. Stomach/Bowel: There is moderate stool throughout the colon. There is no bowel obstruction or active inflammation. Mild thickened appearance of the distal stomach likely related to underdistention. The appendix is normal. Lymphatic: No adenopathy. Reproductive: The prostate and seminal vesicles are grossly unremarkable. Other: None Musculoskeletal: Degenerative changes.  No acute osseous pathology. Review of the MIP images confirms the above findings. IMPRESSION: 1. No acute intrathoracic, abdominal, or pelvic pathology. No aortic aneurysm or dissection. 2. Moderate colonic stool burden. No bowel obstruction. Normal appendix. 3. Minimal haziness adjacent to the gallbladder neck. Ultrasound may provide better  evaluation if there is clinical concern for acute cholecystitis. Electronically Signed   By: Anner Crete M.D.   On: 08/20/2022 19:51        Scheduled Meds:  amLODipine  10 mg Oral Daily   aspirin EC  81 mg Oral Daily   atorvastatin  40 mg Oral Daily   benazepril  10 mg Oral Daily   Chlorhexidine Gluconate Cloth  6 each Topical Daily   insulin aspart  0-15 Units Subcutaneous Q4H   lip balm   Topical BID   metoprolol tartrate  50 mg Oral BID   mupirocin ointment  1 Application Nasal BID   sodium chloride flush  3 mL Intravenous Q12H   Continuous Infusions:  0.9 % NaCl with KCl 20 mEq / L 100 mL/hr at 08/22/22 0553   lactated ringers     methocarbamol (ROBAXIN) IV     ondansetron (ZOFRAN) IV     piperacillin-tazobactam (ZOSYN)  IV 3.375 g (08/22/22 0557)     LOS: 1 day    Time spent: over 30 min    Fayrene Helper, MD [facility]   To contact the attending provider between 7A-7P or the covering provider during after hours 7P-7A, please log into the web site [email or web address] and access using universal [place] password for that web site. If you do not have the password, please call the hospital operator.  08/22/2022, 8:51 AM

## 2022-08-22 NOTE — Progress Notes (Signed)
1 Day Post-Op  Subjective: Feels much better, no nausea  Objective: Vital signs in last 24 hours: Temp:  [97.4 F (36.3 C)-98.8 F (37.1 C)] 98.5 F (36.9 C) (03/22 0556) Pulse Rate:  [55-85] 63 (03/22 0556) Resp:  [5-19] 16 (03/22 0556) BP: (117-151)/(80-96) 130/82 (03/22 0556) SpO2:  [91 %-99 %] 92 % (03/22 0556)   Intake/Output from previous day: 03/21 0701 - 03/22 0700 In: 3468.5 [P.O.:60; I.V.:3137.7; IV Piggyback:270.8] Out: [id number] [Urine:2400; Drains:730; Blood:15] Intake/Output this shift: No intake/output data recorded.   General appearance: alert and cooperative GI: soft, JP with SSF  Incision: no significant drainage  Lab Results:  Recent Labs    08/21/22 0453 08/22/22 0507  WBC 19.0* 16.8*  HGB 14.4 12.4*  HCT 44.7 39.4  PLT 189 149*   BMET Recent Labs    08/21/22 0453 08/22/22 0507  NA 140 139  K 4.2 4.3  CL 102 107  CO2 25 26  GLUCOSE 236* 209*  BUN 17 18  CREATININE 1.11 0.94  CALCIUM 9.0 8.7*   PT/INR Recent Labs    08/21/22 0453  LABPROT 14.9  INR 1.2   ABG No results for input(s): "PHART", "HCO3" in the last 72 hours.  Invalid input(s): "PCO2", "PO2"  MEDS, Scheduled  amLODipine  10 mg Oral Daily   aspirin EC  81 mg Oral Daily   atorvastatin  40 mg Oral Daily   benazepril  10 mg Oral Daily   Chlorhexidine Gluconate Cloth  6 each Topical Daily   insulin aspart  0-15 Units Subcutaneous Q4H   lip balm   Topical BID   metoprolol tartrate  50 mg Oral BID   mupirocin ointment  1 Application Nasal BID   sodium chloride flush  3 mL Intravenous Q12H    Studies/Results: US Abdomen Limited RUQ (LIVER/GB)  Result Date: 08/20/2022 CLINICAL DATA:  [id number] Epigastric pain [id number] EXAM: ULTRASOUND ABDOMEN LIMITED COMPARISON:  02/09/2007. FINDINGS: The liver demonstrates normal parenchymal echogenicity and homogeneous texture without focal hepatic parenchymal lesions or intrahepatic ductal dilatation. Trace fluid identified adjacent to the  liver. Hepatopetal portal vein. Gallbladder is distended with evidence of sludge and focal wall thickening. No shadowing stones or pericholecystic fluid identified. To evaluate for cholecystitis consider HIDA scan. CBD measured 0.4cm. IMPRESSION: 1. Trace perihepatic fluid. 2. Distended gallbladder with wall thickening and echogenic sludge. 3. Findings suggest cholecystitis. Consider HIDA scan, if indicated, to further evaluate for cholecystitis. Electronically Signed   By: [name] M.D.   On: 08/20/2022 20:36   CT Angio Chest/Abd/Pel for Dissection W and/or W/WO  Result Date: 08/20/2022 CLINICAL DATA:  Acute aortic syndrome suspected. EXAM: CT ANGIOGRAPHY CHEST, ABDOMEN AND PELVIS TECHNIQUE: Non-contrast CT of the chest was initially obtained. Multidetector CT imaging through the chest, abdomen and pelvis was performed using the standard protocol during bolus administration of intravenous contrast. Multiplanar reconstructed images and MIPs were obtained and reviewed to evaluate the vascular anatomy. RADIATION DOSE REDUCTION: This exam was performed according to the departmental dose-optimization program which includes automated exposure control, adjustment of the mA and/or kV according to patient size and/or use of iterative reconstruction technique. CONTRAST:  195mL OMNIPAQUE IOHEXOL 350 MG/ML SOLN COMPARISON:  CT abdomen pelvis dated 02/19/2007. FINDINGS: CTA CHEST FINDINGS Cardiovascular: There is no cardiomegaly. Trace pericardial effusion, likely related to recent CABG. There is 3 vessel coronary vascular calcification. The thoracic aorta is unremarkable. The origins of the great vessels of the aortic arch and the central pulmonary arteries appear patent. Mediastinum/Nodes: No  hilar or mediastinal adenopathy. The esophagus is grossly unremarkable. No mediastinal fluid collection. Lungs/Pleura: Minimal bibasilar dependent atelectasis. No focal consolidation, pleural effusion, or pneumothorax. The  central airways are patent. Musculoskeletal: Median sternotomy wires. No acute osseous pathology. Review of the MIP images confirms the above findings. CTA ABDOMEN AND PELVIS FINDINGS VASCULAR Aorta: Mild aortoiliac atherosclerotic disease. No aneurysmal dilatation or dissection. No periaortic fluid collection. Celiac: Patent without evidence of aneurysm, dissection, vasculitis or significant stenosis. SMA: Patent without evidence of aneurysm, dissection, vasculitis or significant stenosis. Renals: The renal arteries are patent. Duplicated left renal artery narrowing. IMA: The IMA is patent. Inflow: Mild atherosclerotic calcification. No aneurysmal dilatation. The iliac arteries are patent. Veins: No obvious venous abnormality within the limitations of this arterial phase study. Review of the MIP images confirms the above findings. NON-VASCULAR No intra-abdominal free air or free fluid. Hepatobiliary: The liver is unremarkable. No biliary ductal dilatation. No calcified gallstone or pericholecystic fluid. Minimal haziness adjacent to the gallbladder neck. Ultrasound may provide better evaluation if there is clinical concern for acute cholecystitis. Pancreas: Unremarkable. No pancreatic ductal dilatation or surrounding inflammatory changes. Spleen: Normal in size without focal abnormality. Adrenals/Urinary Tract: The adrenal glands unremarkable. The kidneys, visualized ureters, and the urinary bladder appear unremarkable. Stomach/Bowel: There is moderate stool throughout the colon. There is no bowel obstruction or active inflammation. Mild thickened appearance of the distal stomach likely related to underdistention. The appendix is normal. Lymphatic: No adenopathy. Reproductive: The prostate and seminal vesicles are grossly unremarkable. Other: None Musculoskeletal: Degenerative changes.  No acute osseous pathology. Review of the MIP images confirms the above findings. IMPRESSION: 1. No acute intrathoracic,  abdominal, or pelvic pathology. No aortic aneurysm or dissection. 2. Moderate colonic stool burden. No bowel obstruction. Normal appendix. 3. Minimal haziness adjacent to the gallbladder neck. Ultrasound may provide better evaluation if there is clinical concern for acute cholecystitis. Electronically Signed   By: Anner Crete M.D.   On: 08/20/2022 19:51    Assessment: s/p Procedure(s): LAPAROSCOPIC CHOLECYSTECTOMY Patient Active Problem List   Diagnosis Date Noted   Preop cardiovascular exam 08/21/2022   Prolonged QT interval 08/21/2022   CAD s/p 4 vessel CABG 07/23/2022 08/20/2022   Acute cholecystitis 08/20/2022   Diabetes mellitus type 2, noninsulin dependent ([place]) 08/20/2022   Postoperative atrial fibrillation ([place]) 08/20/2022   S/P CABG (coronary artery bypass graft) 07/23/2022   Endotracheally intubated 07/23/2022   Secondary hypertension 07/23/2022   Abnormal nuclear stress test 07/10/2022   Right knee pain 03/07/2013   Degenerative progressive high myopia 11/29/2012   Left knee pain 04/22/2011    Expected post op course.  Gangrenous perforated gallbladder  Plan: Advance diet as tolerated Cont JP for 2 wks to monitor for bile leak Cont IV abx today, abx course will finish on 3/26.  Most likely can switch to PO regimen tomorrow and possible d/c tom   LOS: 1 day     .Rosario Adie, [facility], [place]    08/22/2022 8:45 AM

## 2022-08-22 NOTE — Plan of Care (Signed)
  Problem: Education: Goal: Knowledge of disease or condition will improve Outcome: Progressing   Problem: Education: Goal: Knowledge of the prescribed therapeutic regimen will improve Outcome: Progressing   Problem: Education: Goal: Individualized Educational Video(s) Outcome: Progressing

## 2022-08-22 NOTE — TOC CM/SW Note (Signed)
  Transition of Care [facility]) Screening Note   Patient Details  Name: Luis Castillo Date of Birth: 02-02-1961   Transition of Care [facility]) CM/SW Contact:    Lennart Pall, LCSW Phone Number: 08/22/2022, 10:32 AM    Transition of Care Department [facility]) has reviewed patient and no TOC needs have been identified at this time. We will continue to monitor patient advancement through interdisciplinary progression rounds. If new patient transition needs arise, please place a TOC consult.

## 2022-08-22 NOTE — Progress Notes (Signed)
Rounding Note    Patient Name: Luis Castillo Date of Encounter: 08/22/2022  [place] Cardiologist: Evalina Field, MD   Subjective   Doing well postoperatively.  Denies any chest pain or dyspnea.  Inpatient Medications    Scheduled Meds:  amLODipine  10 mg Oral Daily   aspirin EC  81 mg Oral Daily   atorvastatin  40 mg Oral Daily   benazepril  10 mg Oral Daily   Chlorhexidine Gluconate Cloth  6 each Topical Daily   enoxaparin (LOVENOX) injection  40 mg Subcutaneous Q24H   insulin aspart  0-5 Units Subcutaneous QHS   insulin aspart  0-9 Units Subcutaneous TID WC   lip balm   Topical BID   metoprolol tartrate  50 mg Oral BID   mupirocin ointment  1 Application Nasal BID   sodium chloride flush  3 mL Intravenous Q12H   Continuous Infusions:  0.9 % NaCl with KCl 20 mEq / L 10 mL/hr at 08/22/22 0931   lactated ringers     methocarbamol (ROBAXIN) IV     ondansetron (ZOFRAN) IV     piperacillin-tazobactam (ZOSYN)  IV 3.375 g (08/22/22 0557)   PRN Meds: acetaminophen, alum & mag hydroxide-simeth, bisacodyl, HYDROmorphone (DILAUDID) injection, lactated ringers, magic mouthwash, menthol-cetylpyridinium, methocarbamol (ROBAXIN) IV, methocarbamol, metoprolol tartrate, ondansetron (ZOFRAN) IV **OR** ondansetron (ZOFRAN) IV, oxyCODONE, phenol, prochlorperazine, simethicone   Vital Signs    Vitals:   08/21/22 1919 08/21/22 2047 08/21/22 2152 08/22/22 0556  BP: (!) 142/80 (!) 143/82 129/81 130/82  Pulse: 66 68 66 63  Resp: 16 18 18 16   Temp: 97.9 F (36.6 C) 98.6 F (37 C) 98.4 F (36.9 C) 98.5 F (36.9 C)  TempSrc:  Oral Oral Oral  SpO2: 97% 98% 98% 92%  Weight:      Height:        Intake/Output Summary (Last 24 hours) at 08/22/2022 1019 Last data filed at 08/22/2022 0900 Gross per 24 hour  Intake 3678.54 ml  Output 3545 ml  Net 133.54 ml      08/21/2022    1:05 AM 08/20/2022    4:48 PM 08/13/2022   10:12 AM  Last 3 Weights  Weight (lbs) 210 lb 1.6  oz 202 lb 208 lb  Weight (kg) 95.3 kg 91.627 kg 94.348 kg      Telemetry    NSR - Personally Reviewed  ECG    Normal sinus rhythm, rate 67, PAC, QTc 424- personally Reviewed  Physical Exam   GEN: No acute distress.   Neck: No JVD Cardiac: RRR, no murmurs, rubs, or gallops.  Respiratory: Clear to auscultation bilaterally. GI: Soft, nontender, non-distended  MS: No edema; No deformity. Neuro:  Nonfocal  Psych: Normal affect   Labs    High Sensitivity Troponin:   Recent Labs  Lab 08/20/22 1650  TROPONINIHS 4     Chemistry Recent Labs  Lab 08/20/22 1650 08/21/22 0453 08/22/22 0507  NA 135 140 139  K 3.8 4.2 4.3  CL 100 102 107  CO2 26 25 26   GLUCOSE 135* 236* 209*  BUN 17 17 18   CREATININE 1.21 1.11 0.94  CALCIUM 9.0 9.0 8.7*  PROT 7.8 7.5 6.4*  ALBUMIN 4.2 4.0 3.2*  AST 20 23 23   ALT 26 25 30   ALKPHOS 115 100 74  BILITOT 1.2 1.4* 0.8  GFRNONAA >60 >60 >60  ANIONGAP 9 13 6     Lipids No results for input(s): "CHOL", "TRIG", "HDL", "LABVLDL", "LDLCALC", "CHOLHDL" in  the last 168 hours.  Hematology Recent Labs  Lab 08/20/22 1650 08/21/22 0453 08/22/22 0507  WBC 12.4* 19.0* 16.8*  RBC 4.67 4.84 4.13*  HGB 14.5 14.4 12.4*  HCT 42.6 44.7 39.4  MCV 91.2 92.4 95.4  MCH 31.0 29.8 30.0  MCHC 34.0 32.2 31.5  RDW 13.3 13.5 13.9  PLT 210 189 149*   Thyroid No results for input(s): "TSH", "FREET4" in the last 168 hours.  BNP Recent Labs  Lab 08/20/22 1650  BNP 94.7    DDimer No results for input(s): "DDIMER" in the last 168 hours.   Radiology    US Abdomen Limited RUQ (LIVER/GB)  Result Date: 08/20/2022 CLINICAL DATA:  [id number] Epigastric pain [id number] EXAM: ULTRASOUND ABDOMEN LIMITED COMPARISON:  02/09/2007. FINDINGS: The liver demonstrates normal parenchymal echogenicity and homogeneous texture without focal hepatic parenchymal lesions or intrahepatic ductal dilatation. Trace fluid identified adjacent to the liver. Hepatopetal portal vein. Gallbladder  is distended with evidence of sludge and focal wall thickening. No shadowing stones or pericholecystic fluid identified. To evaluate for cholecystitis consider HIDA scan. CBD measured 0.4cm. IMPRESSION: 1. Trace perihepatic fluid. 2. Distended gallbladder with wall thickening and echogenic sludge. 3. Findings suggest cholecystitis. Consider HIDA scan, if indicated, to further evaluate for cholecystitis. Electronically Signed   By: [name] M.D.   On: 08/20/2022 20:36   CT Angio Chest/Abd/Pel for Dissection W and/or W/WO  Result Date: 08/20/2022 CLINICAL DATA:  Acute aortic syndrome suspected. EXAM: CT ANGIOGRAPHY CHEST, ABDOMEN AND PELVIS TECHNIQUE: Non-contrast CT of the chest was initially obtained. Multidetector CT imaging through the chest, abdomen and pelvis was performed using the standard protocol during bolus administration of intravenous contrast. Multiplanar reconstructed images and MIPs were obtained and reviewed to evaluate the vascular anatomy. RADIATION DOSE REDUCTION: This exam was performed according to the departmental dose-optimization program which includes automated exposure control, adjustment of the mA and/or kV according to patient size and/or use of iterative reconstruction technique. CONTRAST:  127mL OMNIPAQUE IOHEXOL 350 MG/ML SOLN COMPARISON:  CT abdomen pelvis dated 02/19/2007. FINDINGS: CTA CHEST FINDINGS Cardiovascular: There is no cardiomegaly. Trace pericardial effusion, likely related to recent CABG. There is 3 vessel coronary vascular calcification. The thoracic aorta is unremarkable. The origins of the great vessels of the aortic arch and the central pulmonary arteries appear patent. Mediastinum/Nodes: No hilar or mediastinal adenopathy. The esophagus is grossly unremarkable. No mediastinal fluid collection. Lungs/Pleura: Minimal bibasilar dependent atelectasis. No focal consolidation, pleural effusion, or pneumothorax. The central airways are patent. Musculoskeletal:  Median sternotomy wires. No acute osseous pathology. Review of the MIP images confirms the above findings. CTA ABDOMEN AND PELVIS FINDINGS VASCULAR Aorta: Mild aortoiliac atherosclerotic disease. No aneurysmal dilatation or dissection. No periaortic fluid collection. Celiac: Patent without evidence of aneurysm, dissection, vasculitis or significant stenosis. SMA: Patent without evidence of aneurysm, dissection, vasculitis or significant stenosis. Renals: The renal arteries are patent. Duplicated left renal artery narrowing. IMA: The IMA is patent. Inflow: Mild atherosclerotic calcification. No aneurysmal dilatation. The iliac arteries are patent. Veins: No obvious venous abnormality within the limitations of this arterial phase study. Review of the MIP images confirms the above findings. NON-VASCULAR No intra-abdominal free air or free fluid. Hepatobiliary: The liver is unremarkable. No biliary ductal dilatation. No calcified gallstone or pericholecystic fluid. Minimal haziness adjacent to the gallbladder neck. Ultrasound may provide better evaluation if there is clinical concern for acute cholecystitis. Pancreas: Unremarkable. No pancreatic ductal dilatation or surrounding inflammatory changes. Spleen: Normal in size without focal abnormality. Adrenals/Urinary Tract:  The adrenal glands unremarkable. The kidneys, visualized ureters, and the urinary bladder appear unremarkable. Stomach/Bowel: There is moderate stool throughout the colon. There is no bowel obstruction or active inflammation. Mild thickened appearance of the distal stomach likely related to underdistention. The appendix is normal. Lymphatic: No adenopathy. Reproductive: The prostate and seminal vesicles are grossly unremarkable. Other: None Musculoskeletal: Degenerative changes.  No acute osseous pathology. Review of the MIP images confirms the above findings. IMPRESSION: 1. No acute intrathoracic, abdominal, or pelvic pathology. No aortic aneurysm or  dissection. 2. Moderate colonic stool burden. No bowel obstruction. Normal appendix. 3. Minimal haziness adjacent to the gallbladder neck. Ultrasound may provide better evaluation if there is clinical concern for acute cholecystitis. Electronically Signed   By: Anner Crete M.D.   On: 08/20/2022 19:51    Cardiac Studies     Patient Profile     62 y.o. male with history of CAD status post CABG 07/2022, postoperative atrial fibrillation, T2DM, hypertension, chronic diastolic heart failure we are consulted for preoperative evaluation prior to cholecystectomy   Assessment & Plan    CAD status post CABG x 4 07/2022, LIMA-LAD, radial artery-PLV, SVG-PDA, SVG-diagonal on 07/23/2022.  Denies any anginal symptoms -Continue aspirin 81 mg daily perioperatively -Continue statin and beta-blocker  Postoperative atrial fibrillation: Developed brief Afib after his CABG.  Started on amiodarone, has had no evidence of recurrent Afib.  Initial EKG on admission showed prolonged QT, amiodarone discontinued.  Repeat EKG shows normal Qtc.  Monitor for A-fib recurrence  Acute cholecystitis: Status post cholecystectomy 3/21.  On abx  [facility] will sign off.   Medication Recommendations:  ASA 81 mg daily, atorvastatin 40 mg daily, amlodipine 10 mg daily, benazepril 10 mg daily, metoprolol 50 mg BID Other recommendations (labs, testing, etc):  None  Follow up as an outpatient:  Will schedule    For questions or updates, please contact [place] Please consult [email or web address] for contact info under        Signed, Donato Heinz, MD  08/22/2022, 10:19 AM

## 2022-08-23 DIAGNOSIS — K810 Acute cholecystitis: Secondary | ICD-10-CM | POA: Diagnosis not present

## 2022-08-23 LAB — COMPREHENSIVE METABOLIC PANEL
ALT: 30 U/L (ref 0–44)
AST: 22 U/L (ref 15–41)
Albumin: 2.9 g/dL — ABNORMAL LOW (ref 3.5–5.0)
Alkaline Phosphatase: 68 U/L (ref 38–126)
Anion gap: 8 (ref 5–15)
BUN: 16 mg/dL (ref 8–23)
CO2: 27 mmol/L (ref 22–32)
Calcium: 8 mg/dL — ABNORMAL LOW (ref 8.9–10.3)
Chloride: 104 mmol/L (ref 98–111)
Creatinine, Ser: 0.88 mg/dL (ref 0.61–1.24)
GFR, Estimated: >60 mL/min (ref >60)
Glucose, Bld: 144 mg/dL — ABNORMAL HIGH (ref 70–99)
Potassium: 3.5 mmol/L (ref 3.5–5.1)
Sodium: 139 mmol/L (ref 135–145)
Total Bilirubin: 0.9 mg/dL (ref 0.3–1.2)
Total Protein: 6.2 g/dL — ABNORMAL LOW (ref 6.5–8.1)

## 2022-08-23 LAB — MAGNESIUM: Magnesium: 2 mg/dL (ref 1.7–2.4)

## 2022-08-23 LAB — PHOSPHORUS: Phosphorus: 2.3 mg/dL — ABNORMAL LOW (ref 2.5–4.6)

## 2022-08-23 LAB — CBC WITH DIFFERENTIAL/PLATELET
Abs Immature Granulocytes: 0.04 10*3/uL (ref 0.00–0.07)
Basophils Absolute: 0.1 10*3/uL (ref 0.0–0.1)
Basophils Relative: 1 %
Eosinophils Absolute: 0.2 10*3/uL (ref 0.0–0.5)
Eosinophils Relative: 2 %
HCT: 36.6 % — ABNORMAL LOW (ref 39.0–52.0)
Hemoglobin: 11.9 g/dL — ABNORMAL LOW (ref 13.0–17.0)
Immature Granulocytes: 0 %
Lymphocytes Relative: 25 %
Lymphs Abs: 2.4 10*3/uL (ref 0.7–4.0)
MCH: 30.5 pg (ref 26.0–34.0)
MCHC: 32.5 g/dL (ref 30.0–36.0)
MCV: 93.8 fL (ref 80.0–100.0)
Monocytes Absolute: 0.7 10*3/uL (ref 0.1–1.0)
Monocytes Relative: 8 %
Neutro Abs: 6.1 10*3/uL (ref 1.7–7.7)
Neutrophils Relative %: 64 %
Platelets: 147 10*3/uL — ABNORMAL LOW (ref 150–400)
RBC: 3.9 MIL/uL — ABNORMAL LOW (ref 4.22–5.81)
RDW: 13.4 % (ref 11.5–15.5)
WBC: 9.4 10*3/uL (ref 4.0–10.5)
nRBC: 0 % (ref 0.0–0.2)

## 2022-08-23 LAB — GLUCOSE, CAPILLARY: Glucose-Capillary: 148 mg/dL — ABNORMAL HIGH (ref 70–99)

## 2022-08-23 MED ORDER — POLYETHYLENE GLYCOL 3350 17 G PO PACK: 17.0000 g | PACK | Freq: Every day | ORAL | 0 refills | Status: DC | PRN

## 2022-08-23 MED ORDER — AMOXICILLIN-POT CLAVULANATE 875-125 MG PO TABS: 1.0000 | ORAL_TABLET | Freq: Two times a day (BID) | ORAL | 0 refills | Status: AC

## 2022-08-23 MED ORDER — ASPIRIN 81 MG PO TBEC: 81.0000 mg | DELAYED_RELEASE_TABLET | Freq: Every day | ORAL | 11 refills | Status: DC

## 2022-08-23 NOTE — Progress Notes (Signed)
2 Days Post-Op   Subjective/Chief Complaint: Feels well, ambulating, no n/v tol diet, having flatus, voiding   Objective: Vital signs in last 24 hours: Temp:  [98 F (36.7 C)-98.8 F (37.1 C)] 98.8 F (37.1 C) (03/23 0544) Pulse Rate:  [57-58] 58 (03/23 0544) Resp:  [17-18] 18 (03/23 0544) BP: (119-126)/(71-76) 126/75 (03/23 0544) SpO2:  [94 %-98 %] 94 % (03/23 0544) Weight:  [97.9 kg] 97.9 kg (03/23 0546) Last BM Date : 08/21/22  Intake/Output from previous day: 03/22 0701 - 03/23 0700 In: 1355.9 [P.O.:720; I.V.:585.9; IV Piggyback:50] Out: 2265 [Urine:2100; Drains:165] Intake/Output this shift: No intake/output data recorded.  Drain serosang, ab approp tender incisions clean  Lab Results:  Recent Labs    08/21/22 0453 08/22/22 0507  WBC 19.0* 16.8*  HGB 14.4 12.4*  HCT 44.7 39.4  PLT 189 149*   BMET Recent Labs    08/21/22 0453 08/22/22 0507  NA 140 139  K 4.2 4.3  CL 102 107  CO2 25 26  GLUCOSE 236* 209*  BUN 17 18  CREATININE 1.11 0.94  CALCIUM 9.0 8.7*   PT/INR Recent Labs    08/21/22 0453  LABPROT 14.9  INR 1.2   ABG No results for input(s): "PHART", "HCO3" in the last 72 hours.  Invalid input(s): "PCO2", "PO2"  Studies/Results: No results found.  Anti-infectives: Anti-infectives (From admission, onward)    Start     Dose/Rate Route Frequency Ordered Stop   08/21/22 0800  piperacillin-tazobactam (ZOSYN) IVPB 3.375 g        3.375 g 12.5 mL/hr over 240 Minutes Intravenous Every 8 hours 08/21/22 0703     08/20/22 2200  cefTRIAXone (ROCEPHIN) 2 g in sodium chloride 0.9 % 100 mL IVPB  Status:  Discontinued       Note to Pharmacy: Pharmacy may adjust dosing strength / duration / interval for maximal efficacy   2 g 200 mL/hr over 30 Minutes Intravenous Every 24 hours 08/20/22 2101 08/21/22 0635       Assessment/Plan: POD 2 lap chole -can dc home with drain this am from my standpoint -has oxy at home from bypass and will use  tylenol - I will write for augmentin for total 5 d course -f/u clinic  Rolm Bookbinder 08/23/2022

## 2022-08-23 NOTE — Discharge Summary (Signed)
Physician Discharge Summary  TIODORO GULBRANSEN [id number] DOB: [birth year] DOA: 08/20/2022  PCP: Wenda Low, MD  Admit date: 08/20/2022 Discharge date: 08/23/2022  Time spent: 40 minutes  Recommendations for Outpatient Follow-up:  Follow outpatient CBC/CMP  Follow with general surgery outpatient Follow with cardiology outpatient - amiodarone d/c'd   Discharge Diagnoses:  Principal Problem:   Acute cholecystitis Active Problems:   S/P CABG (coronary artery bypass graft)   Secondary hypertension   CAD s/p 4 vessel CABG 07/23/2022   Diabetes mellitus type 2, noninsulin dependent ([place])   Postoperative atrial fibrillation ([place])   Preop cardiovascular exam   Prolonged QT interval   Discharge Condition: stable  Diet recommendation: heart healthy  Filed Weights   08/20/22 1648 08/21/22 0105 08/23/22 0546  Weight: 91.6 kg 95.3 kg 97.9 kg    History of present illness:   Luis Castillo is Luis Castillo 62 y.o. male with medical history significant of CAD with recent CABG procedure February 2024, diabetes mellitus on oral agents, history of postoperative atrial fibrillation on amiodarone, hypertension, dyslipidemia, HFpEF and mild osteoarthritis who presented with acute cholecystitis, now s/p lap chole.   Discharge on 3/23 in stable condition with PO abx.  Hospital Course:  Assessment and Plan:  Acute cholecystitis S/p lap chole 3/21, gangrenous, perforated cholecystitis Continue abx for now -> will d/c with oral abx JP drain x2 weeks Will discharge today with augmentin.  He has pain meds from his recent cabg. Seen by cardiology preoperatively for clearance   Acute hypoxemia Resolved, due to splinting/abdominal pain   Recent CABG procedure Has done well from Sanda Dejoy postoperative standpoint Statin, aspirin (ok to reduce to 81 mg daily) Continue beta-blocker   Diabetes mellitus on oral agents w/ hyperglycemia home Jardiance, Amaryl and metformin  Hemoglobin A1c was 7.6 on  07/22/2022   Postoperative atrial fibrillation Continue metoprolol Amiodarone d/c'd due to qt prolongation Telemetry monitoring   Hypertension Lotensin, metoprolol and amlodipine      Procedures: Lap chole   Consultations: Surgery cardiology  Discharge Exam: Vitals:   08/22/22 2033 08/23/22 0544  BP: 119/76 126/75  Pulse: (!) 58 (!) 58  Resp: 17 18  Temp: 98.5 F (36.9 C) 98.8 F (37.1 C)  SpO2: 98% 94%   Denies significant pain Eager to d/c, discussed d/c plan  General: No acute distress. Cardiovascular: RRR Lungs: unlabored Abdomen: Soft, nontender, nondistended - JP drain with scant serosanguinous drainage Neurological: Alert and oriented 3. Moves all extremities 4 with equal strength. Cranial nerves II through XII grossly intact. Extremities: No clubbing or cyanosis. No edema.  Discharge Instructions   Discharge Instructions     Call MD for:  difficulty breathing, headache or visual disturbances   Complete by: As directed    Call MD for:  extreme fatigue   Complete by: As directed    Call MD for:  hives   Complete by: As directed    Call MD for:  persistant dizziness or light-headedness   Complete by: As directed    Call MD for:  persistant nausea and vomiting   Complete by: As directed    Call MD for:  redness, tenderness, or signs of infection (pain, swelling, redness, odor or green/yellow discharge around incision site)   Complete by: As directed    Call MD for:  severe uncontrolled pain   Complete by: As directed    Call MD for:  temperature >100.4   Complete by: As directed    Diet - low sodium heart healthy  Complete by: As directed    Discharge instructions   Complete by: As directed    You were seen for cholecystitis (gallbladder inflammation).  You've improved after surgery.  You'll continue on antibiotics as prescribed by general surgery.  Use tylenol as needed for pain.  Oxycodone can also be used for pain management for pain not  controlled by tylenol.  You were seen by cardiology prior to your surgery.  We stopped your amiodarone due to QT prolongation.  You should follow up with cardiology outpatient to review your medications further.   Return for new, recurrent, or worsening symptoms.  Please ask your PCP to request records from this hospitalization so they know what was done and what the next steps will be.   Discharge wound care:   Complete by: As directed    Per general surgery   Increase activity slowly   Complete by: As directed       Allergies as of 08/23/2022   No Known Allergies      Medication List     STOP taking these medications    amiodarone 200 MG tablet Commonly known as: PACERONE       TAKE these medications    amLODipine 10 MG tablet Commonly known as: NORVASC Take 1 tablet (10 mg total) by mouth daily.   amoxicillin-clavulanate 875-125 MG tablet Commonly known as: AUGMENTIN Take 1 tablet by mouth 2 (two) times daily for 3 days.   aspirin EC 81 MG tablet Take 1 tablet (81 mg total) by mouth daily. Swallow whole. What changed:  medication strength how much to take additional instructions   atorvastatin 40 MG tablet Commonly known as: LIPITOR Take 40 mg by mouth daily.   benazepril 10 MG tablet Commonly known as: LOTENSIN Take 1 tablet (10 mg total) by mouth daily.   empagliflozin 25 MG Tabs tablet Commonly known as: JARDIANCE Take 25 mg by mouth daily.   glimepiride 2 MG tablet Commonly known as: AMARYL Take 2 mg by mouth every morning.   metFORMIN 500 MG tablet Commonly known as: GLUCOPHAGE Take 1,000 mg by mouth 2 (two) times daily with Fernado Brigante meal.   metoprolol tartrate 50 MG tablet Commonly known as: LOPRESSOR Take 1 tablet (50 mg total) by mouth 2 (two) times daily.   polyethylene glycol 17 g packet Commonly known as: MiraLax Take 17 g by mouth daily as needed.   Viagra 100 MG tablet Generic drug: sildenafil Take 100 mg by mouth daily as needed  for erectile dysfunction.               Discharge Care Instructions  (From admission, onward)           Start     Ordered   08/23/22 0000  Discharge wound care:       Comments: Per general surgery   08/23/22 0839           No Known Allergies  Follow-up Information     Leighton Ruff, MD Follow up.   Specialties: General Surgery, Colon and Rectal Surgery Contact information: [address] [place] [address] [phone]         Deberah Pelton, NP Follow up.   Specialty: Cardiology Why: Hospital follow-up with Cardiology scheduled for 09/25/2022 at 9:15am. Please arrive 15 minutes early for check-in. If this date/time does not work for you, please call our office to reschedule. Contact information: [address] [place]  [address] [phone]  The results of significant diagnostics from this hospitalization (including imaging, microbiology, ancillary and laboratory) are listed below for reference.    Significant Diagnostic Studies: US Abdomen Limited RUQ (LIVER/GB)  Result Date: 08/20/2022 CLINICAL DATA:  [id number] Epigastric pain [id number] EXAM: ULTRASOUND ABDOMEN LIMITED COMPARISON:  02/09/2007. FINDINGS: The liver demonstrates normal parenchymal echogenicity and homogeneous texture without focal hepatic parenchymal lesions or intrahepatic ductal dilatation. Trace fluid identified adjacent to the liver. Hepatopetal portal vein. Gallbladder is distended with evidence of sludge and focal wall thickening. No shadowing stones or pericholecystic fluid identified. To evaluate for cholecystitis consider HIDA scan. CBD measured 0.4cm. IMPRESSION: 1. Trace perihepatic fluid. 2. Distended gallbladder with wall thickening and echogenic sludge. 3. Findings suggest cholecystitis. Consider HIDA scan, if indicated, to further evaluate for cholecystitis. Electronically Signed   By: [name] M.D.   On: 08/20/2022  20:36   CT Angio Chest/Abd/Pel for Dissection W and/or W/WO  Result Date: 08/20/2022 CLINICAL DATA:  Acute aortic syndrome suspected. EXAM: CT ANGIOGRAPHY CHEST, ABDOMEN AND PELVIS TECHNIQUE: Non-contrast CT of the chest was initially obtained. Multidetector CT imaging through the chest, abdomen and pelvis was performed using the standard protocol during bolus administration of intravenous contrast. Multiplanar reconstructed images and MIPs were obtained and reviewed to evaluate the vascular anatomy. RADIATION DOSE REDUCTION: This exam was performed according to the departmental dose-optimization program which includes automated exposure control, adjustment of the mA and/or kV according to patient size and/or use of iterative reconstruction technique. CONTRAST:  137mL OMNIPAQUE IOHEXOL 350 MG/ML SOLN COMPARISON:  CT abdomen pelvis dated 02/19/2007. FINDINGS: CTA CHEST FINDINGS Cardiovascular: There is no cardiomegaly. Trace pericardial effusion, likely related to recent CABG. There is 3 vessel coronary vascular calcification. The thoracic aorta is unremarkable. The origins of the great vessels of the aortic arch and the central pulmonary arteries appear patent. Mediastinum/Nodes: No hilar or mediastinal adenopathy. The esophagus is grossly unremarkable. No mediastinal fluid collection. Lungs/Pleura: Minimal bibasilar dependent atelectasis. No focal consolidation, pleural effusion, or pneumothorax. The central airways are patent. Musculoskeletal: Median sternotomy wires. No acute osseous pathology. Review of the MIP images confirms the above findings. CTA ABDOMEN AND PELVIS FINDINGS VASCULAR Aorta: Mild aortoiliac atherosclerotic disease. No aneurysmal dilatation or dissection. No periaortic fluid collection. Celiac: Patent without evidence of aneurysm, dissection, vasculitis or significant stenosis. SMA: Patent without evidence of aneurysm, dissection, vasculitis or significant stenosis. Renals: The renal  arteries are patent. Duplicated left renal artery narrowing. IMA: The IMA is patent. Inflow: Mild atherosclerotic calcification. No aneurysmal dilatation. The iliac arteries are patent. Veins: No obvious venous abnormality within the limitations of this arterial phase study. Review of the MIP images confirms the above findings. NON-VASCULAR No intra-abdominal free air or free fluid. Hepatobiliary: The liver is unremarkable. No biliary ductal dilatation. No calcified gallstone or pericholecystic fluid. Minimal haziness adjacent to the gallbladder neck. Ultrasound may provide better evaluation if there is clinical concern for acute cholecystitis. Pancreas: Unremarkable. No pancreatic ductal dilatation or surrounding inflammatory changes. Spleen: Normal in size without focal abnormality. Adrenals/Urinary Tract: The adrenal glands unremarkable. The kidneys, visualized ureters, and the urinary bladder appear unremarkable. Stomach/Bowel: There is moderate stool throughout the colon. There is no bowel obstruction or active inflammation. Mild thickened appearance of the distal stomach likely related to underdistention. The appendix is normal. Lymphatic: No adenopathy. Reproductive: The prostate and seminal vesicles are grossly unremarkable. Other: None Musculoskeletal: Degenerative changes.  No acute osseous pathology. Review of the MIP images confirms the above findings. IMPRESSION: 1. No acute intrathoracic,  abdominal, or pelvic pathology. No aortic aneurysm or dissection. 2. Moderate colonic stool burden. No bowel obstruction. Normal appendix. 3. Minimal haziness adjacent to the gallbladder neck. Ultrasound may provide better evaluation if there is clinical concern for acute cholecystitis. Electronically Signed   By: Anner Crete M.D.   On: 08/20/2022 19:51   DG Chest 2 View  Result Date: 07/27/2022 CLINICAL DATA:  Pleural effusion. EXAM: CHEST - 2 VIEW COMPARISON:  Chest x-rays dated 07/25/2022 and 07/23/2022.  FINDINGS: RIGHT IJ Cordis has been removed. Median sternotomy wires appear intact and stable in alignment. Heart size and mediastinal contours are stable. Mild atelectasis at the LEFT lung base and small LEFT pleural effusion. RIGHT lung is clear. No pneumothorax is seen. IMPRESSION: Mild atelectasis at the LEFT lung base and small LEFT pleural effusion. Electronically Signed   By: Franki Cabot M.D.   On: 07/27/2022 08:38   DG Chest 2 View  Result Date: 07/25/2022 CLINICAL DATA:  Pneumothorax EXAM: CHEST - 2 VIEW COMPARISON:  AP chest 07/25/2022 at 5:43 Shinichi Anguiano.m., 07/24/2022 FINDINGS: Right internal jugular central venous catheter sheath with indwelling catheter tip at the superior vena cava/right atrial junction, unchanged from prior. Status post median sternotomy and CABG. Cardiac silhouette is again at the upper limits of normal size for AP technique. Mediastinal contours are within normal limits. Mildly decreased lung volumes. Left midlung platelike atelectasis is similar to prior. Small left pleural effusion with mild left basilar retrocardiac opacity, unchanged. No pneumothorax is seen. No acute skeletal abnormality. IMPRESSION: 1. No pneumothorax is seen. 2. Small left pleural effusion, similar to prior. 3. Left mid and lower lung subsegmental atelectasis, similar to prior. Electronically Signed   By: Yvonne Kendall M.D.   On: 07/25/2022 13:18   DG Chest Port 1 View  Result Date: 07/25/2022 CLINICAL DATA:  Pleural effusion EXAM: PORTABLE CHEST 1 VIEW COMPARISON:  07/24/2022 FINDINGS: Right IJ central venous catheter remains in place. Stable cardiomediastinal contours status post sternotomy and CABG. Left greater than right bibasilar atelectasis with improving aeration from prior. No significant pleural fluid collection. No pneumothorax. IMPRESSION: Left greater than right bibasilar atelectasis with improving aeration from prior. Electronically Signed   By: Davina Poke D.O.   On: 07/25/2022 08:22     Microbiology: Recent Results (from the past 240 hour(s))  Surgical pcr screen     Status: Abnormal   Collection Time: 08/21/22 11:34 AM   Specimen: Nasal Mucosa; Nasal Swab  Result Value Ref Range Status   MRSA, PCR NEGATIVE NEGATIVE Final   Staphylococcus aureus POSITIVE (Joud Pettinato) NEGATIVE Final    Comment: (NOTE) The Xpert SA Assay (FDA approved for NASAL specimens in patients 50 years of age and older), is one component of [facility] comprehensive surveillance program. It is not intended to diagnose infection nor to guide or monitor treatment. Performed at [facility], [place] [address] [place], [place] [address]      Labs: Basic Metabolic Panel: Recent Labs  Lab 08/20/22 1650 08/21/22 0453 08/22/22 0507  NA 135 140 139  K 3.8 4.2 4.3  CL 100 102 107  CO2 26 25 26   GLUCOSE 135* 236* 209*  BUN 17 17 18   CREATININE 1.21 1.11 0.94  CALCIUM 9.0 9.0 8.7*   Liver Function Tests: Recent Labs  Lab 08/20/22 1650 08/21/22 0453 08/22/22 0507  AST 20 23 23   ALT 26 25 30   ALKPHOS 115 100 74  BILITOT 1.2 1.4* 0.8  PROT 7.8 7.5 6.4*  ALBUMIN 4.2 4.0  3.2*   Recent Labs  Lab 08/20/22 1650  LIPASE 26   No results for input(s): "AMMONIA" in the last 168 hours. CBC: Recent Labs  Lab 08/20/22 1650 08/21/22 0453 08/22/22 0507 08/23/22 0811  WBC 12.4* 19.0* 16.8* 9.4  NEUTROABS  --  15.0*  --  6.1  HGB 14.5 14.4 12.4* 11.9*  HCT 42.6 44.7 39.4 36.6*  MCV 91.2 92.4 95.4 93.8  PLT 210 189 149* 147*   Cardiac Enzymes: No results for input(s): "CKTOTAL", "CKMB", "CKMBINDEX", "TROPONINI" in the last 168 hours. BNP: BNP (last 3 results) Recent Labs    08/20/22 1650  BNP 94.7    ProBNP (last 3 results) No results for input(s): "PROBNP" in the last 8760 hours.  CBG: Recent Labs  Lab 08/22/22 0714 08/22/22 1114 08/22/22 1606 08/22/22 2107 08/23/22 0745  GLUCAP 163* 275* 243* 195* 148*       Signed:  Fayrene Helper MD.  [facility] 08/23/2022, 8:42 AM

## 2022-08-25 ENCOUNTER — Ambulatory Visit: Payer: 59 | Admitting: Cardiovascular Disease

## 2022-09-04 ENCOUNTER — Other Ambulatory Visit: Payer: Self-pay | Admitting: Thoracic Surgery (Cardiothoracic Vascular Surgery)

## 2022-09-04 DIAGNOSIS — Z951 Presence of aortocoronary bypass graft: Secondary | ICD-10-CM

## 2022-09-05 ENCOUNTER — Ambulatory Visit (INDEPENDENT_AMBULATORY_CARE_PROVIDER_SITE_OTHER): Payer: Self-pay | Admitting: Thoracic Surgery (Cardiothoracic Vascular Surgery)

## 2022-09-05 ENCOUNTER — Ambulatory Visit
Admission: RE | Admit: 2022-09-05 | Discharge: 2022-09-05 | Disposition: A | Discharge status: Home / Self Care | Payer: 59 | Source: Ambulatory Visit | Attending: Thoracic Surgery (Cardiothoracic Vascular Surgery) | Admitting: Thoracic Surgery (Cardiothoracic Vascular Surgery)

## 2022-09-05 VITALS — BP 147/77 | HR 60 | Resp 18 | Ht 74.0 in | Wt 198.0 lb

## 2022-09-05 DIAGNOSIS — I2510 Atherosclerotic heart disease of native coronary artery without angina pectoris: Secondary | ICD-10-CM

## 2022-09-05 DIAGNOSIS — Z951 Presence of aortocoronary bypass graft: Secondary | ICD-10-CM

## 2022-09-05 MED ORDER — CEPHALEXIN 500 MG PO CAPS: 500.0000 mg | ORAL_CAPSULE | Freq: Three times a day (TID) | ORAL | 0 refills | Status: DC

## 2022-09-09 NOTE — Progress Notes (Signed)
      [address]       [place] [id number]             [phone]        [facility]EM[facility] Medical Record #[id number] Date of Birth: [birth year]  Referring: Runell Gess, MD Primary Care: Georgann Housekeeper, MD Primary Cardiologist:[place] Cleophus Molt, MD  Reason for visit:   follow-up  History of Present Illness:     62 year old male presents in follow-up after undergoing CABG.  Overall he is doing well.  He has noticed some redness and drainage from his radial harvest site.  He remains very active and denies any shortness of breath or chest pain.  Physical Exam: BP (!) 147/77 (BP Location: Left Arm, Patient Position: Sitting)   Pulse 60   Resp 18   Ht 6\' 2"  (1.88 m)   Wt 198 lb (89.8 kg)   SpO2 96% Comment: RA  BMI 25.42 kg/m   Alert NAD Incision clean, the radial harvest site has a small 1 cm area of erythema.  There is no drainage..  Sternum is stable Abdomen, ND No peripheral edema   Diagnostic Studies & Laboratory data: CXR: Clear     Assessment / Plan:   62 year old male, status post CABG.  He has some mild erythema along his right palmar side.  I have given him a 7-day course of Keflex.  Will touch base with him in a week to see if this is improved.   Corliss Skains 09/09/2022 10:17 AM

## 2022-09-18 ENCOUNTER — Other Ambulatory Visit: Payer: Self-pay | Admitting: Physician Assistant

## 2022-09-23 NOTE — Progress Notes (Deleted)
Cardiology Clinic Note   Patient Name: Luis Castillo Date of Encounter: 09/23/2022  Primary Care Provider:  Georgann Housekeeper, MD Primary Cardiologist:  Reatha Harps, MD  Patient Profile    Luis Castillo 62 year old male presents the clinic today for follow-up evaluation post CABG x 4 on 07/23/2022.  Past Medical History    Past Medical History:  Diagnosis Date   Arthritis    Complication of anesthesia    Heart rate dropped with Knee surgery   Coronary artery disease    Diabetes mellitus without complication (HCC)    Headache    History of blood transfusion    Hypertension    Past Surgical History:  Procedure Laterality Date   BACK SURGERY  2006   CHOLECYSTECTOMY N/A 08/21/2022   Procedure: LAPAROSCOPIC CHOLECYSTECTOMY;  Surgeon: Romie Levee, MD;  Location: [facility];  Service: General;  Laterality: N/A;   COLONOSCOPY WITH PROPOFOL N/A 11/10/2016   Procedure: COLONOSCOPY WITH PROPOFOL;  Surgeon: Charolett Bumpers, MD;  Location: [facility];  Service: Endoscopy;  Laterality: N/A;   CORONARY ARTERY BYPASS GRAFT N/A 07/23/2022   Procedure: CORONARY ARTERY BYPASS GRAFTING (CABG) X FOUR USING LEFT INTERNAL MAMMARY ARTERY, LEFT RADIAL ARTERY AND RIGHT GREATER SAPHENOUS VEIN HARVESTED ENDOSCOPICALLY.;  Surgeon: Corliss Skains, MD;  Location: [facility] OR;  Service: Open Heart Surgery;  Laterality: N/A;   KNEE CARTILAGE SURGERY  2015   Both knees done.   LEFT HEART CATH AND CORONARY ANGIOGRAPHY N/A 07/10/2022   Procedure: LEFT HEART CATH AND CORONARY ANGIOGRAPHY;  Surgeon: Runell Gess, MD;  Location: [facility];  Service: Cardiovascular;  Laterality: N/A;   RADIAL ARTERY HARVEST Left 07/23/2022   Procedure: RADIAL ARTERY HARVEST;  Surgeon: Corliss Skains, MD;  Location: [facility] OR;  Service: Open Heart Surgery;  Laterality: Left;   TEE WITHOUT CARDIOVERSION N/A 07/23/2022   Procedure: TRANSESOPHAGEAL ECHOCARDIOGRAM;  Surgeon: Corliss Skains, MD;  Location: [facility]  OR;  Service: Open Heart Surgery;  Laterality: N/A;    Allergies  No Known Allergies  History of Present Illness    Luis Castillo has a PMH of coronary artery disease status post CABG x 4 on 07/23/2022, HTN, HLD, and diabetes.  He underwent coronary calcium CT which showed an elevated calcium score.  He underwent stress testing which was concerning for ischemic changes.  He subsequently had cardiac catheterization which showed severe multivessel coronary disease.  He reported that he continued to exercise regularly and will do 40 minutes of cardiovascular exercise 5-6 times per week without symptoms.  His sister had suddenly died at home but there was no strong family history of coronary disease.  Preoperatively his A1c was noted to be 7.6.  He was seen and evaluated by Dr. Cliffton Asters.  Shared decision making was used to decide to proceed with CABG.  He was taken to the operating room 07/23/2022 and underwent CABG x 4.  (LIMA-LAD, radial artery to PLV, SVG-PDA, SVG-diagonal.  He tolerated surgery well.  He was extubated without complications.  He was started on amlodipine for radial artery harvest.  He was fluid volume overloaded and diuresed appropriately.  His chest tubes and p epicardial pacing wires were removed without complication on 07/25/2022.  He was noted to have a brief episode of atrial fibrillation on 07/25/2022.  He received IV amiodarone and converted to sinus rhythm.  He became hypertensive and his amlodipine was increased to 5 mg daily and his Lopressor was increased to 37.5  mg twice daily.  His hypertension continued after benazepril was added to his medication regimen so his metoprolol was uptitrated to 50 mg twice daily and his amlodipine uptitrated to 10 mg daily.  He continued to progress well and was discharged in stable condition on 07/28/2022.  He presents to the clinic today for follow-up evaluation and states he felt like he was hit by a truck the first 2 days after his surgery.   He is now walking regularly, 2.5 miles daily for the last 3 days.  I encouraged him to not increase his physical activity too fast.  We reviewed his medications.  He and his wife expressed understanding.  We reviewed sternal precautions.  We reviewed his CABG surgery.  His blood pressure in the clinic today is 136/88.  His EKG shows some normal sinus rhythm right axis deviation 64 bpm.  He denies subsequent palpitations.  He has noted a few episodes where he feels a head rash when standing.  We reviewed the importance of standing up slowly and taking p.o. hydration.  Will plan follow-up with Dr. Flora Lipps in 3 months.  Today he denies chest pain, shortness of breath, lower extremity edema, fatigue, palpitations, melena, hematuria, hemoptysis, diaphoresis, weakness,  orthopnea, and PND.   Coronary artery disease-continues to progress well postoperatively.  CABG x 4 on 07/23/2022.  SVG as well as radial conduit harvest.  Followed up with cardiothoracic surgery on 09/05/2022.  Remained stable from cardiac standpoint. Continue amlodipine, atorvastatin, metoprolol Heart healthy low-sodium diet-salty 6 given Increase physical activity as tolerated while maintaining sternal precautions  Hyperlipidemia-LDL 27 on 07/24/22.  Previously on lower dose of atorvastatin and increased to 40 mg daily preoperatively. Continue aspirin, atorvastatin Heart healthy low-sodium high-fiber diet Increase physical activity as tolerated while maintaining sternal precautions  Acute cholecystitis-underwent laparoscopic cholecystectomy 08/21/2022.  Was noted to have gangrenous perforated cholecystitis.  Received course of antibiotics during admission and was discharged on oral antibiotics.  Also received JP drain x 2 weeks. Follow-up with PCP  Postoperative atrial fibrillation-heart rate well-controlled.  Denies irregular or accelerated beats. Continue  metoprolol Heart healthy low-sodium diet-salty 6 reviewed Avoid triggers  caffeine, chocolate, EtOH, dehydration etc.  Essential hypertension-BP today***136/88.  Lowest blood pressure at home 100 systolic, encourage p.o. hydration. Continue benazepril, amlodipine, metoprolol Maintain blood pressure log Increase physical activity as tolerated  Disposition: Follow-up with Dr. Flora Lipps or me in 3-4 months.  Home Medications    Prior to Admission medications   Medication Sig Start Date End Date Taking? Authorizing Provider  amiodarone (PACERONE) 200 MG tablet Take 2 tabs twice per day for 7 days, then take 1 tab twice per day for 7 days, then take 1 tab once per day thereafter 07/28/22   Ronney Lion, Oren Bracket, PA-C  amLODipine (NORVASC) 10 MG tablet Take 1 tablet (10 mg total) by mouth daily. 07/28/22   Stehler, Oren Bracket, PA-C  aspirin EC 325 MG tablet Take 1 tablet (325 mg total) by mouth daily. 07/28/22   Stehler, Oren Bracket, PA-C  atorvastatin (LIPITOR) 40 MG tablet Take 40 mg by mouth daily. 05/05/22   [provider]  benazepril (LOTENSIN) 10 MG tablet Take 1 tablet (10 mg total) by mouth daily. 07/28/22   Stehler, Oren Bracket, PA-C  empagliflozin (JARDIANCE) 25 MG TABS tablet Take 25 mg by mouth daily. 01/05/18   [provider]  glimepiride (AMARYL) 2 MG tablet Take 2 mg by mouth every morning. 04/17/22   [provider]  metFORMIN (GLUCOPHAGE) 500 MG  tablet Take 1,000 mg by mouth 2 (two) times daily with a meal.    [provider]  metoprolol tartrate (LOPRESSOR) 50 MG tablet Take 1 tablet (50 mg total) by mouth 2 (two) times daily. 07/28/22   Stehler, Oren Bracket, PA-C  oxyCODONE (OXY IR/ROXICODONE) 5 MG immediate release tablet Take 1 tablet (5 mg total) by mouth every 6 (six) hours as needed for severe pain. 07/28/22   Stehler, Oren Bracket, PA-C  VIAGRA 100 MG tablet Take 100 mg by mouth daily as needed for erectile dysfunction.  03/03/11   [provider]    Family History    Family History  Problem Relation Age of Onset   Cancer  Father    Heart attack Sister    He indicated that his mother is deceased. He indicated that his father is deceased. He indicated that the status of his sister is unknown.  Social History    Social History   Socioeconomic History   Marital status: Married    Spouse name: Not on file   Number of children: 3   Years of education: Not on file   Highest education level: Not on file  Occupational History   Occupation: COO Photographer  Tobacco Use   Smoking status: Never   Smokeless tobacco: Never  Vaping Use   Vaping Use: Never used  Substance and Sexual Activity   Alcohol use: Yes    Alcohol/week: 2.0 standard drinks of alcohol    Types: 2 Cans of beer per week    Comment: Weekly   Drug use: No   Sexual activity: Yes  Other Topics Concern   Not on file  Social History Narrative   Not on file   Social Determinants of Health   Financial Resource Strain: Not on file  Food Insecurity: No Food Insecurity (08/21/2022)   Hunger Vital Sign    Worried About Running Out of Food in the Last Year: Never true    Ran Out of Food in the Last Year: Never true  Transportation Needs: No Transportation Needs (08/21/2022)   PRAPARE - Administrator, Civil Service (Medical): No    Lack of Transportation (Non-Medical): No  Physical Activity: Not on file  Stress: Not on file  Social Connections: Not on file  Intimate Partner Violence: Not At Risk (08/21/2022)   Humiliation, Afraid, Rape, and Kick questionnaire    Fear of Current or Ex-Partner: No    Emotionally Abused: No    Physically Abused: No    Sexually Abused: No     Review of Systems    General:  No chills, fever, night sweats or weight changes.  Cardiovascular:  No chest pain, dyspnea on exertion, edema, orthopnea, palpitations, paroxysmal nocturnal dyspnea. Dermatological: No rash, lesions/masses Respiratory: No cough, dyspnea Urologic: No hematuria, dysuria Abdominal:   No nausea, vomiting, diarrhea, bright  red blood per rectum, melena, or hematemesis Neurologic:  No visual changes, wkns, changes in mental status. All other systems reviewed and are otherwise negative except as noted above.  Physical Exam    VS:  There were no vitals taken for this visit. , BMI There is no height or weight on file to calculate BMI. GEN: Well nourished, well developed, in no acute distress. HEENT: normal. Neck: Supple, no JVD, carotid bruits, or masses. Cardiac: RRR, no murmurs, rubs, or gallops. No clubbing, cyanosis, edema.  Radials/DP/PT 2+ and equal bilaterally.  Respiratory:  Respirations regular and unlabored, clear to auscultation bilaterally. GI:  Soft, nontender, nondistended, BS + x 4. MS: no deformity or atrophy. Skin: warm and dry, no rash.  Surgical incisions healing well no signs of infection. Neuro:  Strength and sensation are intact. Psych: Normal affect.  Accessory Clinical Findings    Recent Labs: 08/20/2022: B Natriuretic Peptide 94.7 08/23/2022: ALT 30; BUN 16; Creatinine, Ser 0.88; Hemoglobin 11.9; Magnesium 2.0; Platelets 147; Potassium 3.5; Sodium 139   Recent Lipid Panel    Component Value Date/Time   CHOL 65 07/24/2022 0317   TRIG 50 07/24/2022 0317   HDL 28 (L) 07/24/2022 0317   CHOLHDL 2.3 07/24/2022 0317   VLDL 10 07/24/2022 0317   LDLCALC 27 07/24/2022 0317    No BP recorded.  {Refresh Note OR Click here to enter BP  :1}***    ECG personally reviewed by me today-***  EKG 08/13/2022 normal sinus rhythm right axis deviation 64 bpm- No acute changes  Echocardiogram 06/17/2022  IMPRESSIONS     1. Left ventricular ejection fraction, by estimation, is 55 to 60%. The  left ventricle has normal function. The left ventricle has no regional  wall motion abnormalities. There is mild concentric left ventricular  hypertrophy. Left ventricular diastolic  parameters are consistent with Grade I diastolic dysfunction (impaired  relaxation).   2. Right ventricular systolic  function is normal. The right ventricular  size is normal. Tricuspid regurgitation signal is inadequate for assessing  PA pressure.   3. The mitral valve is normal in structure. Trivial mitral valve  regurgitation. No evidence of mitral stenosis.   4. The aortic valve is tricuspid. Aortic valve regurgitation is not  visualized. No aortic stenosis is present.   5. Aortic dilatation noted. There is mild dilatation of the aortic root,  measuring 41 mm. There is mild dilatation of the ascending aorta,  measuring 41 mm.   6. The inferior vena cava is normal in size with greater than 50%  respiratory variability, suggesting right atrial pressure of 3 mmHg.   FINDINGS   Left Ventricle: Left ventricular ejection fraction, by estimation, is 55  to 60%. The left ventricle has normal function. The left ventricle has no  regional wall motion abnormalities. The left ventricular internal cavity  size was normal in size. There is   mild concentric left ventricular hypertrophy. Left ventricular diastolic  parameters are consistent with Grade I diastolic dysfunction (impaired  relaxation).   Right Ventricle: The right ventricular size is normal. No increase in  right ventricular wall thickness. Right ventricular systolic function is  normal. Tricuspid regurgitation signal is inadequate for assessing PA  pressure.   Left Atrium: Left atrial size was normal in size.   Right Atrium: Right atrial size was normal in size.   Pericardium: There is no evidence of pericardial effusion.   Mitral Valve: The mitral valve is normal in structure. Trivial mitral  valve regurgitation. No evidence of mitral valve stenosis.   Tricuspid Valve: The tricuspid valve is normal in structure. Tricuspid  valve regurgitation is not demonstrated.   Aortic Valve: The aortic valve is tricuspid. Aortic valve regurgitation is  not visualized. No aortic stenosis is present.   Pulmonic Valve: The pulmonic valve was normal in  structure. Pulmonic valve  regurgitation is trivial.   Aorta: Aortic dilatation noted. There is mild dilatation of the aortic  root, measuring 41 mm. There is mild dilatation of the ascending aorta,  measuring 41 mm.   Venous: The inferior vena cava is normal in size with greater than 50%  respiratory variability, suggesting right atrial pressure of 3 mmHg.   IAS/Shunts: No atrial level shunt detected by color flow Doppler.    Assessment & Plan   1.***   Thomasene Ripple. Diem Dicocco NP-C     09/23/2022, 7:37 AM [facility] [address] Office [phone] Fax [phone]    I spent 15***minutes examining this patient, reviewing medications, and using patient centered shared decision making involving her cardiac care.  Prior to her visit I spent greater than 20 minutes reviewing her past medical history,  medications, and prior cardiac tests.

## 2022-09-25 ENCOUNTER — Ambulatory Visit: Payer: 59 | Admitting: General Practice

## 2022-09-25 ENCOUNTER — Telehealth: Payer: Self-pay | Admitting: Cardiovascular Disease

## 2022-09-25 NOTE — Telephone Encounter (Signed)
Scheduled patient to see Merita Norton on 09/26/22 at 3:10 pm

## 2022-09-26 ENCOUNTER — Encounter: Payer: Self-pay | Admitting: Adult Health

## 2022-09-26 ENCOUNTER — Ambulatory Visit: Payer: 59 | Attending: General Practice | Admitting: Adult Health

## 2022-09-26 VITALS — BP 126/86 | HR 69 | Ht 73.0 in | Wt 208.6 lb

## 2022-09-26 DIAGNOSIS — E119 Type 2 diabetes mellitus without complications: Secondary | ICD-10-CM | POA: Diagnosis not present

## 2022-09-26 DIAGNOSIS — I2510 Atherosclerotic heart disease of native coronary artery without angina pectoris: Secondary | ICD-10-CM | POA: Diagnosis not present

## 2022-09-26 DIAGNOSIS — K810 Acute cholecystitis: Secondary | ICD-10-CM

## 2022-09-26 NOTE — Patient Instructions (Signed)
Medication Instructions:  No Changes *If you need a refill on your cardiac medications before your next appointment, please call your pharmacy*   Lab Work: No Labs If you have labs (blood work) drawn today and your tests are completely normal, you will receive your results only by: MyChart Message (if you have MyChart) OR A paper copy in the mail If you have any lab test that is abnormal or we need to change your treatment, we will call you to review the results.   Testing/Procedures: No Testing   Follow-Up: At [facility], you and your health needs are our priority.  As part of our continuing mission to provide you with exceptional heart care, we have created designated Provider Care Teams.  These Care Teams include your primary Cardiologist (physician) and Advanced Practice Providers (APPs -  Physician Assistants and Nurse Practitioners) who all work together to provide you with the care you need, when you need it.  We recommend signing up for the patient portal called "MyChart".  Sign up information is provided on this After Visit Summary.  MyChart is used to connect with patients for Virtual Visits (Telemedicine).  Patients are able to view lab/test results, encounter notes, upcoming appointments, etc.  Non-urgent messages can be sent to your provider as well.   To learn more about what you can do with MyChart, go to [email or web address].    Your next appointment:   3 month(s)  Provider:   Reatha Harps, MD

## 2022-09-26 NOTE — Progress Notes (Signed)
Cardiology Clinic Note   Patient Name: Luis Castillo Date of Encounter: 09/26/2022  Primary Care Provider:  Georgann Housekeeper, MD Primary Cardiologist:  Luis Harps, MD  Patient Profile     62 y.o. male with a hx of CAD s/p CABG 07/2022, LIMA-LAD, radial artery-PLV, SVG-PDA, SVG-diagonal on 07/23/2022  type 2 DM, history of postoperative atrial fibrillation on amiodarone, HTN, HLD, HFpEF, mild osteoarthritis Recent hospitalization in 08/2022 for cholecystitis, s/p lap chole, found to have prolonged QT interval and therefore amiodarone was discontinued. Remained on metoprolol.   Past Medical History    Past Medical History:  Diagnosis Date   Arthritis    Complication of anesthesia    Heart rate dropped with Knee surgery   Coronary artery disease    Diabetes mellitus without complication (HCC)    Headache    History of blood transfusion    Hypertension    Past Surgical History:  Procedure Laterality Date   BACK SURGERY  2006   CHOLECYSTECTOMY N/A 08/21/2022   Procedure: LAPAROSCOPIC CHOLECYSTECTOMY;  Surgeon: Luis Levee, MD;  Location: [facility];  Service: General;  Laterality: N/A;   COLONOSCOPY WITH PROPOFOL N/A 11/10/2016   Procedure: COLONOSCOPY WITH PROPOFOL;  Surgeon: Luis Bumpers, MD;  Location: [facility];  Service: Endoscopy;  Laterality: N/A;   CORONARY ARTERY BYPASS GRAFT N/A 07/23/2022   Procedure: CORONARY ARTERY BYPASS GRAFTING (CABG) X FOUR USING LEFT INTERNAL MAMMARY ARTERY, LEFT RADIAL ARTERY AND RIGHT GREATER SAPHENOUS VEIN HARVESTED ENDOSCOPICALLY.;  Surgeon: Luis Skains, MD;  Location: [facility] OR;  Service: Open Heart Surgery;  Laterality: N/A;   KNEE CARTILAGE SURGERY  2015   Both knees done.   LEFT HEART CATH AND CORONARY ANGIOGRAPHY N/A 07/10/2022   Procedure: LEFT HEART CATH AND CORONARY ANGIOGRAPHY;  Surgeon: Luis Gess, MD;  Location: [facility];  Service: Cardiovascular;  Laterality: N/A;   RADIAL ARTERY HARVEST Left 07/23/2022    Procedure: RADIAL ARTERY HARVEST;  Surgeon: Luis Skains, MD;  Location: [facility] OR;  Service: Open Heart Surgery;  Laterality: Left;   TEE WITHOUT CARDIOVERSION N/A 07/23/2022   Procedure: TRANSESOPHAGEAL ECHOCARDIOGRAM;  Surgeon: Luis Skains, MD;  [facility];  Service: Open Heart Surgery;  Laterality: N/A;    Allergies  No Known Allergies  History of Present Illness    Luis Castillo returns today for follow-up of CAD status post CABG.  He reports that on the day that he returned to work after being released by his cardiovascular surgeon, he had a "gallbladder attack" and was seen at [facility] emergency room.  He was sent by ambulance to [facility] where he had an emergent cholecystectomy due to chronic gallbladder.  He was discharged on 08/23/2022 and has now returned to work, in the office doing the business portion of [employer].  He is feeling much better and is gone back to working out on an elliptical 40 minutes daily, and also continues to participate in cardiac rehab.  He is not certain that cardiac rehab is beneficial to him as he is doing more at the gym than they are allowing him to do during his sessions.  He denies any recurrent chest pain although he is having some numbness and tingling in his chest wall and occasional soreness.  Home Medications    Current Outpatient Medications  Medication Sig Dispense Refill   amLODipine (NORVASC) 10 MG tablet Take 1 tablet (10 mg total) by mouth daily. 30 tablet 1   aspirin  EC 81 MG tablet Take 1 tablet (81 mg total) by mouth daily. Swallow whole. 30 tablet 11   atorvastatin (LIPITOR) 40 MG tablet Take 40 mg by mouth daily.     benazepril (LOTENSIN) 10 MG tablet Take 1 tablet (10 mg total) by mouth daily. 30 tablet 1   empagliflozin (JARDIANCE) 25 MG TABS tablet Take 25 mg by mouth daily.  0   glimepiride (AMARYL) 2 MG tablet Take 2 mg by mouth every morning.     metFORMIN (GLUCOPHAGE) 500 MG tablet Take 1,000 mg by mouth 2  (two) times daily with a meal.     metoprolol tartrate (LOPRESSOR) 50 MG tablet Take 1 tablet (50 mg total) by mouth 2 (two) times daily. 30 tablet 1   VIAGRA 100 MG tablet Take 100 mg by mouth daily as needed for erectile dysfunction.      No current facility-administered medications for this visit.     Family History    Family History  Problem Relation Age of Onset   Cancer Father    Heart attack Sister    He indicated that his mother is deceased. He indicated that his father is deceased. He indicated that the status of his sister is unknown.  Social History    Social History   Socioeconomic History   Marital status: Married    Spouse name: Not on file   Number of children: 3   Years of education: Not on file   Highest education level: Not on file  Occupational History   Occupation: COO Photographer  Tobacco Use   Smoking status: Never   Smokeless tobacco: Never  Vaping Use   Vaping Use: Never used  Substance and Sexual Activity   Alcohol use: Yes    Alcohol/week: 2.0 standard drinks of alcohol    Types: 2 Cans of beer per week    Comment: Weekly   Drug use: No   Sexual activity: Yes  Other Topics Concern   Not on file  Social History Narrative   Not on file   Social Determinants of Health   Financial Resource Strain: Not on file  Food Insecurity: No Food Insecurity (08/21/2022)   Hunger Vital Sign    Worried About Running Out of Food in the Last Year: Never true    Ran Out of Food in the Last Year: Never true  Transportation Needs: No Transportation Needs (08/21/2022)   PRAPARE - Administrator, Civil Service (Medical): No    Lack of Transportation (Non-Medical): No  Physical Activity: Not on file  Stress: Not on file  Social Connections: Not on file  Intimate Partner Violence: Not At Risk (08/21/2022)   Humiliation, Afraid, Rape, and Kick questionnaire    Fear of Current or Ex-Partner: No    Emotionally Abused: No    Physically Abused:  No    Sexually Abused: No     Review of Systems    General:  No chills, fever, night sweats or weight changes.  Cardiovascular:  No chest pain, dyspnea on exertion, edema, orthopnea, palpitations, paroxysmal nocturnal dyspnea.  Occasional chest soreness, and numbness on the left chest wall.  Also numbness and tingling left brachial area from vein harvest. Dermatological: No rash, lesions/masses Respiratory: No cough, dyspnea Urologic: No hematuria, dysuria Abdominal:   No nausea, vomiting, diarrhea, bright red blood per rectum, melena, or hematemesis Neurologic:  No visual changes, wkns, changes in mental status. All other systems reviewed and are otherwise negative except as  noted above.     Physical Exam    VS:  BP 126/86   Pulse 69   Ht 6\' 1"  (1.854 m)   Wt 208 lb 9.6 oz (94.6 kg)   SpO2 97%   BMI 27.52 kg/m  , BMI Body mass index is 27.52 kg/m.     GEN: Well nourished, well developed, in no acute distress. HEENT: normal. Neck: Supple, no JVD, carotid bruits, or masses. Cardiac: RRR, no murmurs, rubs, or gallops. No clubbing, cyanosis, edema.  Radials/DP/PT 2+ and equal bilaterally.  Respiratory:  Respirations regular and unlabored, clear to auscultation bilaterally. GI: Soft, nontender, nondistended, BS + x 4. MS: no deformity or atrophy.  Well-healed left brachial vein harvest, well-healed sternotomy scar, and well-healed cholecystectomy incisions. Skin: warm and dry, no rash. Neuro:  Strength and sensation are intact. Psych: Normal affect.  Accessory Clinical Findings    ECG personally reviewed by me today-not completed this office visit.  Lab Results  Component Value Date   WBC 9.4 08/23/2022   HGB 11.9 (L) 08/23/2022   HCT 36.6 (L) 08/23/2022   MCV 93.8 08/23/2022   PLT 147 (L) 08/23/2022   Lab Results  Component Value Date   CREATININE 0.88 08/23/2022   BUN 16 08/23/2022   NA 139 08/23/2022   K 3.5 08/23/2022   CL 104 08/23/2022   CO2 27 08/23/2022    Lab Results  Component Value Date   ALT 30 08/23/2022   AST 22 08/23/2022   ALKPHOS 68 08/23/2022   BILITOT 0.9 08/23/2022   Lab Results  Component Value Date   CHOL 65 07/24/2022   HDL 28 (L) 07/24/2022   LDLCALC 27 07/24/2022   TRIG 50 07/24/2022   CHOLHDL 2.3 07/24/2022    Lab Results  Component Value Date   HGBA1C 7.6 (H) 07/22/2022    Review of Prior Studies: LHC 07/10/2022  RPDA lesion is 95% stenosed.   1st RPL lesion is 99% stenosed.   RPAV lesion is 90% stenosed.   Ost LM to Mid LM lesion is 60% stenosed.   Ost LAD to Prox LAD lesion is 60% stenosed.   Mid LAD lesion is 95% stenosed.   2nd Diag lesion is 95% stenosed.   Dist LAD lesion is 95% stenosed.   Ost Cx to Prox Cx lesion is 90% stenosed.   Ramus lesion is 99% stenosed.    Assessment & Plan   1.  Coronary artery disease: Status post coronary artery bypass grafting on 07/23/2022 as discussed above.  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  He still feels like he has lost some muscle mass and is working on regaining that through weight lifting.  He is not overly tired or fatigued doing so and has no recurrent symptoms.  He is back to work but plans to retire in the next few months.  Continue secondary prevention with blood pressure control, statin therapy, purposeful exercise, and weight management.  2.  Status post acute cholecystitis: He is status post cholecystectomy March 2024 in the setting of chronic gallbladder with significant pain.  He is feeling much better and is back to normal activities.  3.  Hypercholesterolemia: Continues on atorvastatin 40 mg daily.  I have reviewed his labs.  Total cholesterol 65, HDL 28, LDL 27 dated 07/24/2022.  Would recommend decreasing atorvastatin  to 20 mg daily.  He states he wants to speak with his internal  medicine physician prior to decreasing the dose.  4.  Type 2 diabetes: Followed by primary care.  Continues on metformin 500 mg twice daily, glimepiride.  Defer to PCP for management.       Signed, Bettey Mare. Liborio Nixon, ANP, AACC   09/26/2022 4:30 PM      Office [phone] Fax [phone]  Notice: This dictation was prepared with Dragon dictation along with smaller phrase technology. Any transcriptional errors that result from this process are unintentional and may not be corrected upon review.  Also this that  -year-old capacity been taking care of along with a good job to feel well   Smoking and drinking and eating chips Senior Compared to the same test heart calcium his numbers without just stents.  He has not so how long has he been in recovery since his last hospitalization back to work things again or not really Mild anything for rate because you are already taking She so not so sure how much because it is just check so workup wonderful for the surgery  Hello 5 I: Shortness antiplatelet okay since COVID 4 hours or different They are uncertain this

## 2022-11-06 ENCOUNTER — Other Ambulatory Visit: Payer: Self-pay | Admitting: Urology

## 2022-11-06 DIAGNOSIS — R972 Elevated prostate specific antigen [PSA]: Secondary | ICD-10-CM

## 2022-11-07 ENCOUNTER — Telehealth: Payer: Self-pay | Admitting: *Deleted

## 2022-11-07 NOTE — Telephone Encounter (Signed)
   Pre-operative Risk Assessment    Patient Name: Luis Castillo  DOB: [birth year] MRN: [id number]      Request for Surgical Clearance    Procedure:   PROSTATE Bx   Date of Surgery:  Clearance TBD                                Surgeon:  DR. Rhoderick Moody Surgeon's Group or Practice Name:  [facility] Phone number:  [phone] ATTN: PATTI, LPN Fax number:  [phone]   Type of Clearance Requested:   - Medical ; ASA x 5 DAYS PRIOR   Type of Anesthesia:  Not Indicated   Additional requests/questions:    Elpidio Anis   11/07/2022, 4:13 PM

## 2022-11-10 NOTE — Telephone Encounter (Signed)
   Patient Name: LAURENS MATHENY  DOB: 01/07/1961 MRN: [id number]  Primary Cardiologist: Reatha Harps, MD  Chart reviewed as part of pre-operative protocol coverage. Pre-op clearance already addressed by colleagues in earlier phone notes. To summarize recommendations:  -Recently seen by Bailey Mech, NP and he was very active, meets 4 mets. Should be at acceptable risk to move forward with prostate biopsy.   -He is okay to hold ASA x 5 days prior to procedure and restart when medically safe to do so.   Will route this bundled recommendation to requesting provider via Epic fax function and remove from pre-op pool. Please call with questions.  Sharlene Dory, PA-C 11/10/2022, 7:29 AM

## 2022-11-26 ENCOUNTER — Ambulatory Visit: Payer: 59 | Admitting: Cardiovascular Disease

## 2022-12-08 ENCOUNTER — Ambulatory Visit: Payer: 59 | Admitting: Cardiovascular Disease

## 2022-12-22 ENCOUNTER — Other Ambulatory Visit: Payer: 59

## 2022-12-26 ENCOUNTER — Ambulatory Visit: Payer: 59 | Admitting: Cardiovascular Disease

## 2023-01-04 NOTE — Progress Notes (Unsigned)
Cardiology Clinic Note   Patient Name: Luis Castillo Date of Encounter: 01/06/2023  Primary Care Provider:  Georgann Housekeeper, MD Primary Cardiologist:  Reatha Harps, MD  Patient Profile    Luis Castillo 62 year old male presents the clinic today for follow-up evaluation post CABG x 4 on 07/23/2022.  Past Medical History    Past Medical History:  Diagnosis Date   Arthritis    Complication of anesthesia    Heart rate dropped with Knee surgery   Coronary artery disease    Diabetes mellitus without complication (HCC)    Headache    History of blood transfusion    Hypertension    Past Surgical History:  Procedure Laterality Date   BACK SURGERY  2006   CHOLECYSTECTOMY N/A 08/21/2022   Procedure: LAPAROSCOPIC CHOLECYSTECTOMY;  Surgeon: Romie Levee, MD;  Location: [facility];  Service: General;  Laterality: N/A;   COLONOSCOPY WITH PROPOFOL N/A 11/10/2016   Procedure: COLONOSCOPY WITH PROPOFOL;  Surgeon: Charolett Bumpers, MD;  Location: [facility] ENDOSCOPY;  Service: Endoscopy;  Laterality: N/A;   CORONARY ARTERY BYPASS GRAFT N/A 07/23/2022   Procedure: CORONARY ARTERY BYPASS GRAFTING (CABG) X FOUR USING LEFT INTERNAL MAMMARY ARTERY, LEFT RADIAL ARTERY AND RIGHT GREATER SAPHENOUS VEIN HARVESTED ENDOSCOPICALLY.;  Surgeon: Corliss Skains, MD;  Location: [facility] OR;  Service: Open Heart Surgery;  Laterality: N/A;   KNEE CARTILAGE SURGERY  2015   Both knees done.   LEFT HEART CATH AND CORONARY ANGIOGRAPHY N/A 07/10/2022   Procedure: LEFT HEART CATH AND CORONARY ANGIOGRAPHY;  Surgeon: Runell Gess, MD;  Location: [facility] INVASIVE CV LAB;  Service: Cardiovascular;  Laterality: N/A;   RADIAL ARTERY HARVEST Left 07/23/2022   Procedure: RADIAL ARTERY HARVEST;  Surgeon: Corliss Skains, MD;  Location: [facility] OR;  Service: Open Heart Surgery;  Laterality: Left;   TEE WITHOUT CARDIOVERSION N/A 07/23/2022   Procedure: TRANSESOPHAGEAL ECHOCARDIOGRAM;  Surgeon: Corliss Skains, MD;  Location: [facility]  OR;  Service: Open Heart Surgery;  Laterality: N/A;    Allergies  No Known Allergies  History of Present Illness    Luis Castillo has a PMH of coronary artery disease status post CABG x 4 on 07/23/2022, HTN, HLD, and diabetes.  He underwent coronary calcium CT which showed an elevated calcium score.  He underwent stress testing which was concerning for ischemic changes.  He subsequently had cardiac catheterization which showed severe multivessel coronary disease.  He reported that he continued to exercise regularly and will do 40 minutes of cardiovascular exercise 5-6 times per week without symptoms.  His sister had suddenly died at home but there was no strong family history of coronary disease.  Preoperatively his A1c was noted to be 7.6.  He was seen and evaluated by Dr. Cliffton Asters.  Shared decision making was used to decide to proceed with CABG.  He was taken to the operating room 07/23/2022 and underwent CABG x 4.  (LIMA-LAD, radial artery to PLV, SVG-PDA, SVG-diagonal.  He tolerated surgery well.  He was extubated without complications.  He was started on amlodipine for radial artery harvest.  He was fluid volume overloaded and diuresed appropriately.  His chest tubes and p epicardial pacing wires were removed without complication on 07/25/2022.  He was noted to have a brief episode of atrial fibrillation on 07/25/2022.  He received IV amiodarone and converted to sinus rhythm.  He became hypertensive and his amlodipine was increased to 5 mg daily and his Lopressor was increased to 37.5  mg twice daily.  His hypertension continued after benazepril was added to his medication regimen so his metoprolol was uptitrated to 50 mg twice daily and his amlodipine uptitrated to 10 mg daily.  He continued to progress well and was discharged in stable condition on 07/28/2022.  He presented to the clinic 08/13/22 for follow-up evaluation and stated he felt like he was hit by a truck the first 2 days after his  surgery.  He was  walking regularly, 2.5 miles daily for the last 3 days.  I encouraged him to not increase his physical activity too fast.  We reviewed his medications.  He and his wife expressed understanding.  We reviewed sternal precautions.  We reviewed his CABG surgery.  His blood pressure in the clinic was 136/88.  His EKG showed some normal sinus rhythm right axis deviation 64 bpm.  He denied subsequent palpitations.  He had noted a few episodes where he felt a head rash when standing.  We reviewed the importance of standing up slowly and maintaining p.o. hydration.  I planned follow-up with Dr. Flora Lipps in 3 months.  He was seen in follow-up by Bailey Mech, DNP on 09/26/2022.  He had been hospitalized 3/24 for cholecystitis and was status post cholecystectomy.  He was noted to have prolonged QT interval.  His amiodarone was discontinued.  He was continued on metoprolol.  On follow-up he reported that he had returned to work.  He was doing a business portion of [place].  He was feeling much better.  He was using the elliptical 40 minutes daily.  He was participating in cardiac rehab.  He denied chest pain and did note some numbness and tingling along his chest wall with occasional soreness.  His blood pressure was noted to be 126/86.  Pulse was 69.  He presents to the clinic today for follow-up evaluation and states he continues to be very physically active.  He has been golfing weekly and walking with his cart.  He has also been doing the elliptical and treadmill and reports around 240 minutes/week.  He has been tolerating his medications well.  We reviewed his most recent LDL cholesterol which was 73 on 09/29/2022.  Previously his LDL was 27.  I will repeat his fasting lipids and LFTs and plan follow-up in 6 to 9 months.  Today he denies chest pain, shortness of breath, lower extremity edema, fatigue, palpitations, melena, hematuria, hemoptysis, diaphoresis, weakness,  orthopnea, and  PND.     Home Medications    Prior to Admission medications   Medication Sig Start Date End Date Taking? Authorizing Provider  amiodarone (PACERONE) 200 MG tablet Take 2 tabs twice per day for 7 days, then take 1 tab twice per day for 7 days, then take 1 tab once per day thereafter 07/28/22   Ronney Lion, Oren Bracket, PA-C  amLODipine (NORVASC) 10 MG tablet Take 1 tablet (10 mg total) by mouth daily. 07/28/22   Stehler, Oren Bracket, PA-C  aspirin EC 325 MG tablet Take 1 tablet (325 mg total) by mouth daily. 07/28/22   Stehler, Oren Bracket, PA-C  atorvastatin (LIPITOR) 40 MG tablet Take 40 mg by mouth daily. 05/05/22   [provider]  benazepril (LOTENSIN) 10 MG tablet Take 1 tablet (10 mg total) by mouth daily. 07/28/22   Stehler, Oren Bracket, PA-C  empagliflozin (JARDIANCE) 25 MG TABS tablet Take 25 mg by mouth daily. 01/05/18   [provider]  glimepiride (AMARYL) 2 MG tablet Take 2 mg by mouth  every morning. 04/17/22   [provider]  metFORMIN (GLUCOPHAGE) 500 MG tablet Take 1,000 mg by mouth 2 (two) times daily with a meal.    [provider]  metoprolol tartrate (LOPRESSOR) 50 MG tablet Take 1 tablet (50 mg total) by mouth 2 (two) times daily. 07/28/22   Stehler, Oren Bracket, PA-C  oxyCODONE (OXY IR/ROXICODONE) 5 MG immediate release tablet Take 1 tablet (5 mg total) by mouth every 6 (six) hours as needed for severe pain. 07/28/22   Stehler, Oren Bracket, PA-C  VIAGRA 100 MG tablet Take 100 mg by mouth daily as needed for erectile dysfunction.  03/03/11   [provider]    Family History    Family History  Problem Relation Age of Onset   Cancer Father    Heart attack Sister    He indicated that his mother is deceased. He indicated that his father is deceased. He indicated that the status of his sister is unknown.  Social History    Social History   Socioeconomic History   Marital status: Married    Spouse name: Not on file   Number of children: 3   Years  of education: Not on file   Highest education level: Not on file  Occupational History   Occupation: COO Photographer  Tobacco Use   Smoking status: Never   Smokeless tobacco: Never  Vaping Use   Vaping status: Never Used  Substance and Sexual Activity   Alcohol use: Yes    Alcohol/week: 2.0 standard drinks of alcohol    Types: 2 Cans of beer per week    Comment: Weekly   Drug use: No   Sexual activity: Yes  Other Topics Concern   Not on file  Social History Narrative   Not on file   Social Determinants of Health   Financial Resource Strain: Not on file  Food Insecurity: No Food Insecurity (08/21/2022)   Hunger Vital Sign    Worried About Running Out of Food in the Last Year: Never true    Ran Out of Food in the Last Year: Never true  Transportation Needs: No Transportation Needs (08/21/2022)   PRAPARE - Administrator, Civil Service (Medical): No    Lack of Transportation (Non-Medical): No  Physical Activity: Not on file  Stress: Not on file  Social Connections: Not on file  Intimate Partner Violence: Not At Risk (08/21/2022)   Humiliation, Afraid, Rape, and Kick questionnaire    Fear of Current or Ex-Partner: No    Emotionally Abused: No    Physically Abused: No    Sexually Abused: No     Review of Systems    General:  No chills, fever, night sweats or weight changes.  Cardiovascular:  No chest pain, dyspnea on exertion, edema, orthopnea, palpitations, paroxysmal nocturnal dyspnea. Dermatological: No rash, lesions/masses Respiratory: No cough, dyspnea Urologic: No hematuria, dysuria Abdominal:   No nausea, vomiting, diarrhea, bright red blood per rectum, melena, or hematemesis Neurologic:  No visual changes, wkns, changes in mental status. All other systems reviewed and are otherwise negative except as noted above.  Physical Exam    VS:  BP 116/74   Pulse 63   Ht 6' (1.829 m)   Wt 210 lb (95.3 kg)   SpO2 97%   BMI 28.48 kg/m  , BMI Body  mass index is 28.48 kg/m. GEN: Well nourished, well developed, in no acute distress. HEENT: normal. Neck: Supple, no JVD, carotid bruits, or masses.  Cardiac: RRR, no murmurs, rubs, or gallops. No clubbing, cyanosis, edema.  Radials/DP/PT 2+ and equal bilaterally.  Respiratory:  Respirations regular and unlabored, clear to auscultation bilaterally. GI: Soft, nontender, nondistended, BS + x 4. MS: no deformity or atrophy. Skin: warm and dry, no rash.   Neuro:  Strength and sensation are intact. Psych: Normal affect.  Accessory Clinical Findings    Recent Labs: 08/20/2022: B Natriuretic Peptide 94.7 08/23/2022: ALT 30; BUN 16; Creatinine, Ser 0.88; Hemoglobin 11.9; Magnesium 2.0; Platelets 147; Potassium 3.5; Sodium 139   Recent Lipid Panel    Component Value Date/Time   CHOL 65 07/24/2022 0317   TRIG 50 07/24/2022 0317   HDL 28 (L) 07/24/2022 0317   CHOLHDL 2.3 07/24/2022 0317   VLDL 10 07/24/2022 0317   LDLCALC 27 07/24/2022 0317         ECG personally reviewed by me today-none today.  EKG 08/13/2022 normal sinus rhythm right axis deviation 64 bpm- No acute changes  Echocardiogram 06/17/2022  IMPRESSIONS     1. Left ventricular ejection fraction, by estimation, is 55 to 60%. The  left ventricle has normal function. The left ventricle has no regional  wall motion abnormalities. There is mild concentric left ventricular  hypertrophy. Left ventricular diastolic  parameters are consistent with Grade I diastolic dysfunction (impaired  relaxation).   2. Right ventricular systolic function is normal. The right ventricular  size is normal. Tricuspid regurgitation signal is inadequate for assessing  PA pressure.   3. The mitral valve is normal in structure. Trivial mitral valve  regurgitation. No evidence of mitral stenosis.   4. The aortic valve is tricuspid. Aortic valve regurgitation is not  visualized. No aortic stenosis is present.   5. Aortic dilatation noted. There is  mild dilatation of the aortic root,  measuring 41 mm. There is mild dilatation of the ascending aorta,  measuring 41 mm.   6. The inferior vena cava is normal in size with greater than 50%  respiratory variability, suggesting right atrial pressure of 3 mmHg.   FINDINGS   Left Ventricle: Left ventricular ejection fraction, by estimation, is 55  to 60%. The left ventricle has normal function. The left ventricle has no  regional wall motion abnormalities. The left ventricular internal cavity  size was normal in size. There is   mild concentric left ventricular hypertrophy. Left ventricular diastolic  parameters are consistent with Grade I diastolic dysfunction (impaired  relaxation).   Right Ventricle: The right ventricular size is normal. No increase in  right ventricular wall thickness. Right ventricular systolic function is  normal. Tricuspid regurgitation signal is inadequate for assessing PA  pressure.   Left Atrium: Left atrial size was normal in size.   Right Atrium: Right atrial size was normal in size.   Pericardium: There is no evidence of pericardial effusion.   Mitral Valve: The mitral valve is normal in structure. Trivial mitral  valve regurgitation. No evidence of mitral valve stenosis.   Tricuspid Valve: The tricuspid valve is normal in structure. Tricuspid  valve regurgitation is not demonstrated.   Aortic Valve: The aortic valve is tricuspid. Aortic valve regurgitation is  not visualized. No aortic stenosis is present.   Pulmonic Valve: The pulmonic valve was normal in structure. Pulmonic valve  regurgitation is trivial.   Aorta: Aortic dilatation noted. There is mild dilatation of the aortic  root, measuring 41 mm. There is mild dilatation of the ascending aorta,  measuring 41 mm.   Venous: The inferior vena  cava is normal in size with greater than 50%  respiratory variability, suggesting right atrial pressure of 3 mmHg.   IAS/Shunts: No atrial level shunt  detected by color flow Doppler.    Assessment & Plan   1.  Coronary artery disease-denies exertional chest pain.  He has been golfing weekly and walking with his golf.  He has been completing over 240 minutes either on the elliptical or treadmill weekly. Continue  atorvastatin, metoprolol Heart healthy low-sodium diet  Postoperative atrial fibrillation-underwent lap chole for cholecystitis and was noted to have prolonged QT while on amiodarone.  Amiodarone was discontinued.  Denies further episodes of irregular or accelerated heartbeat. Continue metoprolol Heart healthy low-sodium diet-salty 6 given Avoid triggers caffeine, chocolate, EtOH, dehydration etc.  Hyperlipidemia-LDL 73 on 09/29/22.   Continue aspirin, atorvastatin Heart healthy low-sodium high-fiber diet Increase physical activity as tolerated while maintaining sternal precautions Repeat fasting lipids and LFTs  Essential hypertension-BP today 116/74. Marland Kitchen Continue benazepril, amlodipine, metoprolol Maintain blood pressure log Increase physical activity as tolerated  Disposition: Follow-up with Dr. Flora Lipps or me in 6-9 months.   Thomasene Ripple. Dauna Ziska NP-C     01/06/2023, 10:14 AM [facility] [address] Office [phone] Fax [phone]    I spent 14 minutes examining this patient, reviewing medications, and using patient centered shared decision making involving her cardiac care.  Prior to her visit I spent greater than 20 minutes reviewing her past medical history,  medications, and prior cardiac tests.

## 2023-01-06 ENCOUNTER — Encounter: Payer: Self-pay | Admitting: General Practice

## 2023-01-06 ENCOUNTER — Ambulatory Visit: Payer: 59 | Attending: Cardiovascular Disease | Admitting: General Practice

## 2023-01-06 VITALS — BP 116/74 | HR 63 | Ht 72.0 in | Wt 210.0 lb

## 2023-01-06 DIAGNOSIS — E782 Mixed hyperlipidemia: Secondary | ICD-10-CM

## 2023-01-06 DIAGNOSIS — I10 Essential (primary) hypertension: Secondary | ICD-10-CM

## 2023-01-06 DIAGNOSIS — I4891 Unspecified atrial fibrillation: Secondary | ICD-10-CM | POA: Diagnosis not present

## 2023-01-06 DIAGNOSIS — I2510 Atherosclerotic heart disease of native coronary artery without angina pectoris: Secondary | ICD-10-CM

## 2023-01-06 NOTE — Patient Instructions (Signed)
Medication Instructions:  The current medical regimen is effective;  continue present plan and medications as directed. Please refer to the Current Medication list given to you today.  *If you need a refill on your cardiac medications before your next appointment, please call your pharmacy*  Lab Work: FASTING LIPID AND LFT-THIS THURSDAY OR FRIDAY If you have labs (blood work) drawn today and your tests are completely normal, you will receive your results only by:  MyChart Message (if you have MyChart) OR  A paper copy in the mail If you have any lab test that is abnormal or we need to change your treatment, we will call you to review the results.  Testing/Procedures: NONE  Other Instructions CONTINUE DIET AND EXERCISE  Follow-Up: At [facility], you and your health needs are our priority.  As part of our continuing mission to provide you with exceptional heart care, we have created designated Provider Care Teams.  These Care Teams include your primary Cardiologist (physician) and Advanced Practice Providers (APPs -  Physician Assistants and Nurse Practitioners) who all work together to provide you with the care you need, when you need it.  Your next appointment:   6-9 month(s)  Provider:   Reatha Harps, MD  or Edd Fabian, FNP

## 2023-02-16 ENCOUNTER — Ambulatory Visit
Admission: RE | Admit: 2023-02-16 | Discharge: 2023-02-16 | Disposition: A | Discharge status: Home / Self Care | Payer: 59 | Source: Ambulatory Visit | Attending: Urology

## 2023-02-16 DIAGNOSIS — R972 Elevated prostate specific antigen [PSA]: Secondary | ICD-10-CM

## 2023-02-16 MED ORDER — GADOPICLENOL 0.5 MMOL/ML IV SOLN
10.0000 mL | Freq: Once | INTRAVENOUS | Status: AC | PRN
  Administered 2023-02-16: 10 mL via INTRAVENOUS

## 2023-02-27 DIAGNOSIS — R972 Elevated prostate specific antigen [PSA]: Secondary | ICD-10-CM

## 2023-02-27 HISTORY — PX: PROSTATE BIOPSY: SHX241

## 2023-02-27 HISTORY — DX: Elevated prostate specific antigen (PSA): R97.20

## 2023-03-10 ENCOUNTER — Telehealth: Payer: Self-pay | Admitting: Radiation Oncology

## 2023-03-10 NOTE — Telephone Encounter (Signed)
Called patient to schedule a consultation w. Dr. Manning. No answer, LVM for a return call.  ?

## 2023-03-11 ENCOUNTER — Telehealth: Payer: Self-pay | Admitting: Radiation Oncology

## 2023-03-11 NOTE — Telephone Encounter (Signed)
Patient returned call to schedule a consultation w. Dr. Kathrynn Running. Patient requested appt delay due to being out of town.

## 2023-03-11 NOTE — Telephone Encounter (Signed)
Called patient and patient's spouse to schedule for a consultation w. Dr. Kathrynn Running. No answer, LVM for a return call.

## 2023-03-26 ENCOUNTER — Encounter: Payer: Self-pay | Admitting: Radiation Oncology

## 2023-03-30 ENCOUNTER — Telehealth: Payer: Self-pay | Admitting: Radiation Oncology

## 2023-03-30 NOTE — Telephone Encounter (Signed)
Patient called requesting to cancel consultation w. Dr. Kathrynn Running. Patient does not want to r/s at this time, prefers to follow up with urologist. Advised patient to request urologist to send a referral once the patient to ready to r/s. Closing referral until further notice.

## 2023-03-31 ENCOUNTER — Ambulatory Visit: Payer: 59 | Admitting: Radiation Oncology

## 2023-03-31 ENCOUNTER — Ambulatory Visit: Payer: 59

## 2023-05-22 ENCOUNTER — Telehealth: Payer: Self-pay | Admitting: Cardiovascular Disease

## 2023-05-22 ENCOUNTER — Telehealth: Payer: Self-pay

## 2023-05-22 NOTE — Telephone Encounter (Signed)
  Patient Consent for Virtual Visit        Luis Castillo has provided verbal consent on 05/22/2023 for a virtual visit (video or telephone).   CONSENT FOR VIRTUAL VISIT FOR:  Luis Castillo  By participating in this virtual visit I agree to the following:  I hereby voluntarily request, consent and authorize [facility] and its employed or contracted physicians, physician assistants, nurse practitioners or other licensed health care professionals (the Practitioner), to provide me with telemedicine health care services (the "Services") as deemed necessary by the treating Practitioner. I acknowledge and consent to receive the Services by the Practitioner via telemedicine. I understand that the telemedicine visit will involve communicating with the Practitioner through live audiovisual communication technology and the disclosure of certain medical information by electronic transmission. I acknowledge that I have been given the opportunity to request an in-person assessment or other available alternative prior to the telemedicine visit and am voluntarily participating in the telemedicine visit.  I understand that I have the right to withhold or withdraw my consent to the use of telemedicine in the course of my care at any time, without affecting my right to future care or treatment, and that the Practitioner or I may terminate the telemedicine visit at any time. I understand that I have the right to inspect all information obtained and/or recorded in the course of the telemedicine visit and may receive copies of available information for a reasonable fee.  I understand that some of the potential risks of receiving the Services via telemedicine include:  Delay or interruption in medical evaluation due to technological equipment failure or disruption; Information transmitted may not be sufficient (e.g. poor resolution of images) to allow for appropriate medical decision making by the  Practitioner; and/or  In rare instances, security protocols could fail, causing a breach of personal health information.  Furthermore, I acknowledge that it is my responsibility to provide information about my medical history, conditions and care that is complete and accurate to the best of my ability. I acknowledge that Practitioner's advice, recommendations, and/or decision may be based on factors not within their control, such as incomplete or inaccurate data provided by me or distortions of diagnostic images or specimens that may result from electronic transmissions. I understand that the practice of medicine is not an exact science and that Practitioner makes no warranties or guarantees regarding treatment outcomes. I acknowledge that a copy of this consent can be made available to me via my patient portal [facility] MyChart), or I can request a printed copy by calling the office of [facility].    I understand that my insurance will be billed for this visit.   I have read or had this consent read to me. I understand the contents of this consent, which adequately explains the benefits and risks of the Services being provided via telemedicine.  I have been provided ample opportunity to ask questions regarding this consent and the Services and have had my questions answered to my satisfaction. I give my informed consent for the services to be provided through the use of telemedicine in my medical care

## 2023-05-22 NOTE — Telephone Encounter (Signed)
   Name: Luis Castillo  DOB: [birth year]  MRN: [id number]  Primary Cardiologist: Reatha Harps, MD Procedure: HFU left hemi ablation of prostate Last OV: 01/06/23  Preoperative team, please contact this patient and set up a phone call appointment for further preoperative risk assessment. Please obtain consent and complete medication review. Thank you for your help.  I confirm that guidance regarding antiplatelet and oral anticoagulation therapy has been completed and, if necessary, noted below.  Patient may hold aspirin 81 mg daily for 7 days prior to procedure, please resume when safe to do for from a bleeding standpoint.  I also confirmed the patient resides in the state of [place]. As per [facility] Medical Board telemedicine laws, the patient must reside in the state in which the provider is licensed.   Rip Harbour, NP 05/22/2023, 2:27 PM [facility]

## 2023-05-22 NOTE — Telephone Encounter (Signed)
I left a message for the patient to call our office to schedule a tele visit for pre-op clearance.  

## 2023-05-22 NOTE — Telephone Encounter (Signed)
   Pre-operative Risk Assessment  Last visit: 01/06/2023 Next visit: none   Patient Name: Luis Castillo  DOB: Jul 07, 1960 MRN: [id number]      Request for Surgical Clearance    Procedure:   HIFU left hemi ablation of prostate  Date of Surgery:  Clearance 06/14/23                                 Surgeon:  Dr. Barbaraann Faster Surgeon's Group or Practice Name:  [facility] Phone number:  [phone] Fax number:  [phone]   Type of Clearance Requested:   - Pharmacy:  Hold Aspirin Hold Aspirin 7 days prior to surgery   Type of Anesthesia:  General    Additional requests/questions:   Would like the most recent OV note, EKG/Echo/Stress test faxed  Signed, Royann Shivers   05/22/2023, 2:12 PM

## 2023-05-22 NOTE — Telephone Encounter (Signed)
Spoke with patient who is agreeable to do a tele visit on 06/08/23 at 10:40 am. Med rec and consent done.

## 2023-06-08 ENCOUNTER — Ambulatory Visit: Payer: 59 | Attending: Internal Medicine | Admitting: Nurse Practitioner

## 2023-06-08 ENCOUNTER — Encounter: Payer: Self-pay | Admitting: Nurse Practitioner

## 2023-06-08 DIAGNOSIS — Z01810 Encounter for preprocedural cardiovascular examination: Secondary | ICD-10-CM

## 2023-06-08 NOTE — Progress Notes (Signed)
 Virtual Visit via Telephone Note   Because of Luis Castillo's co-morbid illnesses, he is at least at moderate risk for complications without adequate follow up.  This format is felt to be most appropriate for this patient at this time.  The patient did not have access to video technology/had technical difficulties with video requiring transitioning to audio format only (telephone).  All issues noted in this document were discussed and addressed.  No physical exam could be performed with this format.  Please refer to the patient's chart for his consent to telehealth for [facility] [place].  Evaluation Performed:  Preoperative cardiovascular risk assessment _____________   Date:  06/08/2023   Patient ID:  Luis Castillo, DOB 08/06/1960, MRN [id number] Patient Location:  Home Provider location:   Office  Primary Care Provider:  Ransom Other, MD Primary Cardiologist:  Darryle ONEIDA Decent, MD  Chief Complaint / Patient Profile   63 y.o. y/o male with a h/o CAD s/p CABG x 4 on 07/23/22, hypertension, hyperlipidemia, diabetes who is pending HIFU left hemi ablation of prostate with Dr. Cira on 06/14/23 and presents today for telephonic preoperative cardiovascular risk assessment.  History of Present Illness    Luis Castillo is a 63 y.o. male who presents via audio/video conferencing for a telehealth visit today.  Pt was last seen in cardiology clinic on 01/06/23 by Josefa Beauvais, NP.  At that time ARIO MCDIARMID was doing well.  The patient is now pending procedure as outlined above. Since his last visit, he denies chest pain, shortness of breath, lower extremity edema, fatigue, palpitations, melena, hematuria, hemoptysis, diaphoresis, weakness, presyncope, syncope, orthopnea, and PND.  He is very active with regular cardiovascular and weightlifting exercise and is currently in [place]  skiing.  He does not have any concerning cardiac symptoms with exertion.  Past Medical History     Past Medical History:  Diagnosis Date   Arthritis    Complication of anesthesia    Heart rate dropped with Knee surgery   Coronary artery disease    Diabetes mellitus without complication (HCC)    Elevated PSA 02/27/2023   11/06/2022   Headache    Heart disease, unspecified    History of blood transfusion    Hypertension    Past Surgical History:  Procedure Laterality Date   BACK SURGERY  2006   CHOLECYSTECTOMY N/A 08/21/2022   Procedure: LAPAROSCOPIC CHOLECYSTECTOMY;  Surgeon: Debby Hila, MD;  Location: [facility] ORS;  Service: General;  Laterality: N/A;   COLONOSCOPY WITH PROPOFOL  N/A 11/10/2016   Procedure: COLONOSCOPY WITH PROPOFOL ;  Surgeon: Vicci Gladis POUR, MD;  Location: [facility] ENDOSCOPY;  Service: Endoscopy;  Laterality: N/A;   CORONARY ARTERY BYPASS GRAFT N/A 07/23/2022   Procedure: CORONARY ARTERY BYPASS GRAFTING (CABG) X FOUR USING LEFT INTERNAL MAMMARY ARTERY, LEFT RADIAL ARTERY AND RIGHT GREATER SAPHENOUS VEIN HARVESTED ENDOSCOPICALLY.;  Surgeon: Shyrl Linnie KIDD, MD;  Location: [facility] OR;  Service: Open Heart Surgery;  Laterality: N/A;   KNEE CARTILAGE SURGERY  2015   Both knees done.   LEFT HEART CATH AND CORONARY ANGIOGRAPHY N/A 07/10/2022   Procedure: LEFT HEART CATH AND CORONARY ANGIOGRAPHY;  Surgeon: Court Dorn PARAS, MD;  Location: [facility] INVASIVE CV LAB;  Service: Cardiovascular;  Laterality: N/A;   PROSTATE BIOPSY  02/27/2023   RADIAL ARTERY HARVEST Left 07/23/2022   Procedure: RADIAL ARTERY HARVEST;  Surgeon: Shyrl Linnie KIDD, MD;  Location: [facility] OR;  Service: Open Heart Surgery;  Laterality: Left;   TEE WITHOUT CARDIOVERSION N/A  07/23/2022   Procedure: TRANSESOPHAGEAL ECHOCARDIOGRAM;  Surgeon: Shyrl Linnie KIDD, MD;  Location: [facility] OR;  Service: Open Heart Surgery;  Laterality: N/A;    Allergies  No Known Allergies  Home Medications    Prior to Admission medications   Medication Sig Start Date End Date Taking? Authorizing Provider  amLODipine  (NORVASC ) 10  MG tablet Take 1 tablet (10 mg total) by mouth daily. 07/28/22   Stehler, Con BROCKS, PA-C  aspirin  EC 81 MG tablet Take 1 tablet (81 mg total) by mouth daily. Swallow whole. 08/23/22   Perri DELENA Meliton Mickey., MD  atorvastatin  (LIPITOR) 40 MG tablet Take 40 mg by mouth daily. 05/05/22   [provider]  benazepril  (LOTENSIN ) 10 MG tablet Take 1 tablet (10 mg total) by mouth daily. 07/28/22   Stehler, Con BROCKS, PA-C  CONTOUR NEXT TEST test strip 2 (two) times daily. 02/18/23   [provider]  empagliflozin  (JARDIANCE ) 25 MG TABS tablet Take 25 mg by mouth daily. 01/05/18   [provider]  glimepiride  (AMARYL ) 2 MG tablet Take 2 mg by mouth every morning. 04/17/22   [provider]  metFORMIN  (GLUCOPHAGE ) 500 MG tablet Take 1,000 mg by mouth 2 (two) times daily with a meal.    [provider]  metoprolol  tartrate (LOPRESSOR ) 50 MG tablet Take 1 tablet (50 mg total) by mouth 2 (two) times daily. 07/28/22   Stehler, Con BROCKS, PA-C  VIAGRA 100 MG tablet Take 100 mg by mouth daily as needed for erectile dysfunction.  03/03/11   [provider]    Physical Exam    Vital Signs:  [name] does not have vital signs available for review today.  Given telephonic nature of communication, physical exam is limited. AAOx3. NAD. Normal affect.  Speech and respirations are unlabored.  Accessory Clinical Findings    None  Assessment & Plan    1.  Preoperative Cardiovascular Risk Assessment: According to the Revised Cardiac Risk Index (RCRI), his Perioperative Risk of Major Cardiac Event is (%): 0.9. His Functional Capacity in METs is: 9.89 according to the Duke Activity Status Index (DASI). The patient is doing well from a cardiac perspective. Therefore, based on ACC/AHA guidelines, the patient would be at acceptable risk for the planned procedure without further cardiovascular testing.   The patient was advised that if he develops new symptoms prior to  surgery to contact our office to arrange for a follow-up visit, and he verbalized understanding.  Patient may hold aspirin  81 mg daily for 7 days prior to procedure, please resume when safe to do for from a bleeding standpoint   A copy of this note will be routed to requesting surgeon.  Time:   Today, I have spent 5 minutes with the patient with telehealth technology discussing medical history, symptoms, and management plan.     Rosaline EMERSON Bane, NP-C  06/08/2023, 10:48 AM [address] Office [phone] Fax [phone]

## 2023-08-01 DEATH — deceased

## 2023-09-06 NOTE — Progress Notes (Deleted)
  Cardiology Office Note:  .   Date:  09/06/2023  ID:  Gus Height, DOB [birth year], MRN [id number] PCP: Georgann Housekeeper, MD  [facility] Providers Cardiologist:  Reatha Harps, MD { Click to update primary MD,subspecialty MD or APP then REFRESH:1}   History of Present Illness: .   No chief complaint on file.   Luis Castillo is a 63 y.o. male with history of CAD s/p CABG, DM, HTN, HLD who presents for follow-up.      Problem List CAD -4v CABG 07/23/2022 2. DM -A1c 7.9 3. HLD -T chol 129, TG 139, HDL 41, LDL 64 4. HTN 5. Aortic Aneurysm -40 mm 05/2022 6. Postop Afib -after CABG    ROS: All other ROS reviewed and negative. Pertinent positives noted in the HPI.     Studies Reviewed: Marland Kitchen       TTE 06/17/2022  1. Left ventricular ejection fraction, by estimation, is 55 to 60%. The  left ventricle has normal function. The left ventricle has no regional  wall motion abnormalities. There is mild concentric left ventricular  hypertrophy. Left ventricular diastolic  parameters are consistent with Grade I diastolic dysfunction (impaired  relaxation).   2. Right ventricular systolic function is normal. The right ventricular  size is normal. Tricuspid regurgitation signal is inadequate for assessing  PA pressure.   3. The mitral valve is normal in structure. Trivial mitral valve  regurgitation. No evidence of mitral stenosis.   4. The aortic valve is tricuspid. Aortic valve regurgitation is not  visualized. No aortic stenosis is present.   5. Aortic dilatation noted. There is mild dilatation of the aortic root,  measuring 41 mm. There is mild dilatation of the ascending aorta,  measuring 41 mm.   6. The inferior vena cava is normal in size with greater than 50%  respiratory variability, suggesting right atrial pressure of 3 mmHg.  Physical Exam:   VS:  There were no vitals taken for this visit.   Wt Readings from Last 3 Encounters:  01/06/23 210 lb (95.3 kg)   09/26/22 208 lb 9.6 oz (94.6 kg)  09/05/22 198 lb (89.8 kg)    GEN: Well nourished, well developed in no acute distress NECK: No JVD; No carotid bruits CARDIAC: ***RRR, no murmurs, rubs, gallops RESPIRATORY:  Clear to auscultation without rales, wheezing or rhonchi  ABDOMEN: Soft, non-tender, non-distended EXTREMITIES:  No edema; No deformity  ASSESSMENT AND PLAN: .   ***    {Are you ordering a CV Procedure (e.g. stress test, cath, DCCV, TEE, etc)?   Press F2        :[id number]}   Follow-up: No follow-ups on file.  Time Spent with Patient: I have spent a total of *** minutes caring for this patient today face to face, ordering and reviewing labs/tests, reviewing prior records/medical history, examining the patient, establishing an assessment and plan, communicating results/findings to the patient/family, and documenting in the medical record.   Signed, Lenna Gilford. Flora Lipps, MD, [facility]  [facility]  [address] [place], [place] [address] [phone]  8:41 PM

## 2023-09-09 ENCOUNTER — Ambulatory Visit: Payer: 59 | Admitting: Cardiovascular Disease

## 2023-09-09 DIAGNOSIS — I2510 Atherosclerotic heart disease of native coronary artery without angina pectoris: Secondary | ICD-10-CM

## 2023-09-09 DIAGNOSIS — I150 Renovascular hypertension: Secondary | ICD-10-CM

## 2023-09-09 DIAGNOSIS — E782 Mixed hyperlipidemia: Secondary | ICD-10-CM
